# Patient Record
Sex: Female | Born: 1985 | Race: Black or African American | Hispanic: No | Marital: Single | Smoking: Former smoker
Health system: Southern US, Community
[De-identification: ages and names within clinical notes are randomized; demographics above are authoritative.]

## PROBLEM LIST (undated history)

## (undated) ENCOUNTER — Inpatient Hospital Stay (HOSPITAL_COMMUNITY): Payer: Self-pay

## (undated) DIAGNOSIS — A599 Trichomoniasis, unspecified: Secondary | ICD-10-CM

## (undated) DIAGNOSIS — O98219 Gonorrhea complicating pregnancy, unspecified trimester: Secondary | ICD-10-CM

## (undated) DIAGNOSIS — O98819 Other maternal infectious and parasitic diseases complicating pregnancy, unspecified trimester: Secondary | ICD-10-CM

## (undated) DIAGNOSIS — K219 Gastro-esophageal reflux disease without esophagitis: Secondary | ICD-10-CM

## (undated) DIAGNOSIS — R51 Headache: Secondary | ICD-10-CM

## (undated) DIAGNOSIS — A749 Chlamydial infection, unspecified: Secondary | ICD-10-CM

## (undated) HISTORY — PX: NO PAST SURGERIES: SHX2092

---
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
## 2007-06-03 ENCOUNTER — Emergency Department (HOSPITAL_COMMUNITY): Admission: EM | Admit: 2007-06-03 | Discharge: 2007-06-03 | Payer: Self-pay | Admitting: Emergency Medicine

## 2007-12-04 ENCOUNTER — Emergency Department (HOSPITAL_COMMUNITY): Admission: EM | Admit: 2007-12-04 | Discharge: 2007-12-04 | Payer: Self-pay | Admitting: Emergency Medicine

## 2008-01-04 ENCOUNTER — Emergency Department (HOSPITAL_COMMUNITY): Admission: EM | Admit: 2008-01-04 | Discharge: 2008-01-04 | Payer: Self-pay | Admitting: Emergency Medicine

## 2008-09-05 ENCOUNTER — Emergency Department (HOSPITAL_COMMUNITY): Admission: EM | Admit: 2008-09-05 | Discharge: 2008-09-05 | Payer: Self-pay | Admitting: Family Medicine

## 2009-02-04 ENCOUNTER — Emergency Department (HOSPITAL_COMMUNITY): Admission: EM | Admit: 2009-02-04 | Discharge: 2009-02-04 | Payer: Self-pay | Admitting: Emergency Medicine

## 2009-02-09 ENCOUNTER — Emergency Department (HOSPITAL_COMMUNITY): Admission: EM | Admit: 2009-02-09 | Discharge: 2009-02-09 | Payer: Self-pay | Admitting: Emergency Medicine

## 2009-02-17 ENCOUNTER — Inpatient Hospital Stay (HOSPITAL_COMMUNITY): Admission: AD | Admit: 2009-02-17 | Discharge: 2009-02-17 | Payer: Self-pay | Admitting: Obstetrics & Gynecology

## 2009-02-28 ENCOUNTER — Inpatient Hospital Stay (HOSPITAL_COMMUNITY): Admission: AD | Admit: 2009-02-28 | Discharge: 2009-03-01 | Payer: Self-pay | Admitting: Family Medicine

## 2009-09-03 ENCOUNTER — Emergency Department (HOSPITAL_COMMUNITY): Admission: EM | Admit: 2009-09-03 | Discharge: 2009-09-03 | Payer: Self-pay | Admitting: Emergency Medicine

## 2010-01-03 ENCOUNTER — Ambulatory Visit: Payer: Self-pay | Admitting: Advanced Practice Midwife

## 2010-01-03 ENCOUNTER — Inpatient Hospital Stay (HOSPITAL_COMMUNITY): Admission: AD | Admit: 2010-01-03 | Discharge: 2010-01-03 | Payer: Self-pay | Admitting: Obstetrics & Gynecology

## 2010-01-27 ENCOUNTER — Emergency Department (HOSPITAL_COMMUNITY): Admission: EM | Admit: 2010-01-27 | Discharge: 2010-01-27 | Payer: Self-pay | Admitting: Emergency Medicine

## 2010-06-02 LAB — URINALYSIS, ROUTINE W REFLEX MICROSCOPIC
Bilirubin Urine: NEGATIVE
Glucose, UA: NEGATIVE mg/dL
Ketones, ur: NEGATIVE mg/dL
Leukocytes, UA: NEGATIVE
Nitrite: NEGATIVE
Protein, ur: NEGATIVE mg/dL
Specific Gravity, Urine: 1.025 (ref 1.005–1.030)
Urobilinogen, UA: 0.2 mg/dL (ref 0.0–1.0)
pH: 5.5 (ref 5.0–8.0)

## 2010-06-02 LAB — WET PREP, GENITAL
Trich, Wet Prep: NONE SEEN
Yeast Wet Prep HPF POC: NONE SEEN

## 2010-06-02 LAB — GC/CHLAMYDIA PROBE AMP, GENITAL: Chlamydia, DNA Probe: POSITIVE — AB

## 2010-06-02 LAB — URINE MICROSCOPIC-ADD ON

## 2010-06-23 LAB — CBC
HCT: 33.7 % — ABNORMAL LOW (ref 36.0–46.0)
Hemoglobin: 11.6 g/dL — ABNORMAL LOW (ref 12.0–15.0)
MCHC: 34.4 g/dL (ref 30.0–36.0)
MCV: 91.2 fL (ref 78.0–100.0)
Platelets: 307 10*3/uL (ref 150–400)
RBC: 3.7 MIL/uL — ABNORMAL LOW (ref 3.87–5.11)
RDW: 16.7 % — ABNORMAL HIGH (ref 11.5–15.5)
WBC: 7 10*3/uL (ref 4.0–10.5)

## 2010-06-23 LAB — DIFFERENTIAL
Basophils Absolute: 0.1 10*3/uL (ref 0.0–0.1)
Basophils Relative: 1 % (ref 0–1)
Eosinophils Absolute: 0.1 10*3/uL (ref 0.0–0.7)
Monocytes Absolute: 0.5 10*3/uL (ref 0.1–1.0)
Neutro Abs: 3.9 10*3/uL (ref 1.7–7.7)
Neutrophils Relative %: 56 % (ref 43–77)

## 2010-06-23 LAB — GC/CHLAMYDIA PROBE AMP, GENITAL
Chlamydia, DNA Probe: POSITIVE — AB
Chlamydia, DNA Probe: NEGATIVE

## 2010-06-23 LAB — URINALYSIS, ROUTINE W REFLEX MICROSCOPIC
Nitrite: NEGATIVE
Specific Gravity, Urine: 1.024 (ref 1.005–1.030)
pH: 6.5 (ref 5.0–8.0)

## 2010-06-23 LAB — WET PREP, GENITAL
Clue Cells Wet Prep HPF POC: NONE SEEN
Clue Cells Wet Prep HPF POC: NONE SEEN
Trich, Wet Prep: NONE SEEN
Yeast Wet Prep HPF POC: NONE SEEN

## 2010-06-23 LAB — URINE MICROSCOPIC-ADD ON

## 2010-06-23 LAB — URINE CULTURE

## 2010-06-23 LAB — POCT I-STAT, CHEM 8
BUN: 5 mg/dL — ABNORMAL LOW (ref 6–23)
Calcium, Ion: 1.23 mmol/L (ref 1.12–1.32)
Creatinine, Ser: 0.7 mg/dL (ref 0.4–1.2)
Hemoglobin: 12.9 g/dL (ref 12.0–15.0)
Sodium: 137 mEq/L (ref 135–145)
TCO2: 22 mmol/L (ref 0–100)

## 2010-06-23 LAB — POCT PREGNANCY, URINE: Preg Test, Ur: POSITIVE

## 2010-06-28 LAB — GC/CHLAMYDIA PROBE AMP, GENITAL
Chlamydia, DNA Probe: NEGATIVE
GC Probe Amp, Genital: NEGATIVE

## 2010-06-28 LAB — WET PREP, GENITAL
Trich, Wet Prep: NONE SEEN
Yeast Wet Prep HPF POC: NONE SEEN

## 2010-06-28 LAB — POCT URINALYSIS DIP (DEVICE)
Bilirubin Urine: NEGATIVE
Ketones, ur: NEGATIVE mg/dL
Protein, ur: NEGATIVE mg/dL
Specific Gravity, Urine: 1.02 (ref 1.005–1.030)

## 2010-06-28 LAB — POCT PREGNANCY, URINE: Preg Test, Ur: NEGATIVE

## 2010-09-03 ENCOUNTER — Emergency Department (HOSPITAL_COMMUNITY)
Admission: EM | Admit: 2010-09-03 | Discharge: 2010-09-03 | Disposition: A | Discharge status: Home / Self Care | Payer: Self-pay | Attending: Emergency Medicine | Admitting: Emergency Medicine

## 2010-09-03 DIAGNOSIS — K047 Periapical abscess without sinus: Secondary | ICD-10-CM | POA: Insufficient documentation

## 2010-09-03 DIAGNOSIS — K089 Disorder of teeth and supporting structures, unspecified: Secondary | ICD-10-CM | POA: Insufficient documentation

## 2010-09-03 LAB — POCT PREGNANCY, URINE: Preg Test, Ur: NEGATIVE

## 2010-09-03 LAB — URINE MICROSCOPIC-ADD ON

## 2010-09-03 LAB — URINALYSIS, ROUTINE W REFLEX MICROSCOPIC
Glucose, UA: NEGATIVE mg/dL
Ketones, ur: NEGATIVE mg/dL
Leukocytes, UA: NEGATIVE
Nitrite: NEGATIVE
Protein, ur: NEGATIVE mg/dL
Urobilinogen, UA: 0.2 mg/dL (ref 0.0–1.0)

## 2010-09-04 ENCOUNTER — Emergency Department (HOSPITAL_COMMUNITY)
Admission: EM | Admit: 2010-09-04 | Discharge: 2010-09-04 | Disposition: A | Discharge status: Home / Self Care | Payer: Self-pay | Attending: Emergency Medicine | Admitting: Emergency Medicine

## 2010-09-04 DIAGNOSIS — K029 Dental caries, unspecified: Secondary | ICD-10-CM | POA: Insufficient documentation

## 2010-09-04 DIAGNOSIS — R220 Localized swelling, mass and lump, head: Secondary | ICD-10-CM | POA: Insufficient documentation

## 2010-09-04 DIAGNOSIS — R109 Unspecified abdominal pain: Secondary | ICD-10-CM | POA: Insufficient documentation

## 2010-09-04 DIAGNOSIS — R6883 Chills (without fever): Secondary | ICD-10-CM | POA: Insufficient documentation

## 2010-09-04 DIAGNOSIS — K089 Disorder of teeth and supporting structures, unspecified: Secondary | ICD-10-CM | POA: Insufficient documentation

## 2010-09-04 DIAGNOSIS — K047 Periapical abscess without sinus: Secondary | ICD-10-CM | POA: Insufficient documentation

## 2010-09-04 DIAGNOSIS — R112 Nausea with vomiting, unspecified: Secondary | ICD-10-CM | POA: Insufficient documentation

## 2010-12-20 LAB — DIFFERENTIAL
Eosinophils Absolute: 0
Lymphs Abs: 0.4 — ABNORMAL LOW
Monocytes Absolute: 0.7
Monocytes Relative: 8
Neutro Abs: 7.1
Neutrophils Relative %: 87 — ABNORMAL HIGH

## 2010-12-20 LAB — URINALYSIS, ROUTINE W REFLEX MICROSCOPIC
Bilirubin Urine: NEGATIVE
Glucose, UA: NEGATIVE
Nitrite: NEGATIVE
Specific Gravity, Urine: 1.022
pH: 8.5 — ABNORMAL HIGH

## 2010-12-20 LAB — POCT I-STAT, CHEM 8
Chloride: 102
Creatinine, Ser: 0.8
Glucose, Bld: 102 — ABNORMAL HIGH
Potassium: 3.6

## 2010-12-20 LAB — CBC
Hemoglobin: 11.4 — ABNORMAL LOW
MCV: 87.1
RBC: 3.85 — ABNORMAL LOW
WBC: 8.2

## 2011-01-28 ENCOUNTER — Encounter: Payer: Self-pay | Admitting: *Deleted

## 2011-01-28 ENCOUNTER — Emergency Department (HOSPITAL_COMMUNITY)
Admission: EM | Admit: 2011-01-28 | Discharge: 2011-01-28 | Disposition: A | Discharge status: Home / Self Care | Payer: Self-pay | Attending: Emergency Medicine | Admitting: Emergency Medicine

## 2011-01-28 DIAGNOSIS — F17200 Nicotine dependence, unspecified, uncomplicated: Secondary | ICD-10-CM | POA: Insufficient documentation

## 2011-01-28 DIAGNOSIS — B9689 Other specified bacterial agents as the cause of diseases classified elsewhere: Secondary | ICD-10-CM | POA: Insufficient documentation

## 2011-01-28 DIAGNOSIS — A499 Bacterial infection, unspecified: Secondary | ICD-10-CM | POA: Insufficient documentation

## 2011-01-28 DIAGNOSIS — N72 Inflammatory disease of cervix uteri: Secondary | ICD-10-CM | POA: Insufficient documentation

## 2011-01-28 DIAGNOSIS — N760 Acute vaginitis: Secondary | ICD-10-CM | POA: Insufficient documentation

## 2011-01-28 LAB — WET PREP, GENITAL
Trich, Wet Prep: NONE SEEN
Yeast Wet Prep HPF POC: NONE SEEN

## 2011-01-28 LAB — PREGNANCY, URINE: Preg Test, Ur: NEGATIVE

## 2011-01-28 MED ORDER — METRONIDAZOLE 500 MG PO TABS: 500.0000 mg | ORAL_TABLET | Freq: Two times a day (BID) | ORAL | Status: AC

## 2011-01-28 MED ORDER — CEFTRIAXONE SODIUM 250 MG IJ SOLR
250.0000 mg | Freq: Once | INTRAMUSCULAR | Status: AC
  Administered 2011-01-28: 250 mg via INTRAMUSCULAR
  Filled 2011-01-28: qty 250

## 2011-01-28 MED ORDER — AZITHROMYCIN 250 MG PO TABS
1000.0000 mg | ORAL_TABLET | Freq: Once | ORAL | Status: AC
  Administered 2011-01-28: 1000 mg via ORAL
  Filled 2011-01-28: qty 4

## 2011-01-28 MED ORDER — LIDOCAINE HCL 1 % IJ SOLN
INTRAMUSCULAR | Status: AC
  Administered 2011-01-28: 21:00:00
  Filled 2011-01-28: qty 20

## 2011-01-28 NOTE — ED Provider Notes (Signed)
History     CSN: [id number] Arrival date & time: 01/28/2011  5:48 PM   First MD Initiated Contact with Patient 01/28/11 1857      Chief Complaint  Patient presents with  . Vaginal Itching    (Consider location/radiation/quality/duration/timing/severity/associated sxs/prior treatment) Patient is a 25 y.o. female presenting with vaginal itching. The history is provided by the patient.  Vaginal Itching This is a recurrent problem. The current episode started in the past 7 days. The problem occurs constantly. The problem has been gradually worsening. Pertinent negatives include no abdominal pain or weakness. The symptoms are aggravated by intercourse. She has tried nothing for the symptoms.    History reviewed. No pertinent past medical history.  History reviewed. No pertinent past surgical history.  No family history on file.  History  Substance Use Topics  . Smoking status: Current Everyday Smoker -- 0.5 packs/day  . Smokeless tobacco: Not on file  . Alcohol Use: No    OB History    Grav Para Term Preterm Abortions TAB SAB Ect Mult Living                  Review of Systems  Constitutional: Negative.   HENT: Negative.   Eyes: Negative.   Respiratory: Negative.   Cardiovascular: Negative.   Gastrointestinal: Negative.  Negative for abdominal pain.  Genitourinary: Positive for vaginal discharge and vaginal pain. Negative for dysuria, flank pain, decreased urine volume, difficulty urinating and pelvic pain.  Musculoskeletal: Negative.   Neurological: Negative.  Negative for weakness.  Hematological: Negative.   Psychiatric/Behavioral: Negative.     Allergies  Review of patient's allergies indicates no known allergies.  Home Medications   Current Outpatient Rx  Name Route Sig Dispense Refill  . ACETAMINOPHEN 500 MG PO TABS Oral Take 1,000 mg by mouth every 6 (six) hours as needed. Pain.     Marland Kitchen NAPROXEN SODIUM 220 MG PO TABS Oral Take 220 mg by mouth 2 (two) times  daily with a meal. Pain.       BP 110/67  Pulse 88  Temp(Src) 98.2 F (36.8 C) (Oral)  Resp 88  Wt 144 lb (65.318 kg)  SpO2 100%  LMP 01/05/2011  Physical Exam  Constitutional: She is oriented to person, place, and time. She appears well-developed.  HENT:  Head: Normocephalic.  Eyes: Pupils are equal, round, and reactive to light.  Neck: Normal range of motion.  Cardiovascular: Normal rate.   Pulmonary/Chest: Effort normal.  Abdominal: Soft.  Genitourinary: There is breast tenderness and discharge. No breast swelling. Vaginal discharge found.  Musculoskeletal: Normal range of motion.  Neurological: She is oriented to person, place, and time.  Skin: Skin is warm.  Psychiatric: She has a normal mood and affect.    ED Course  Procedures (including critical care time)   Labs Reviewed  PREGNANCY, URINE  GC/CHLAMYDIA PROBE AMP, GENITAL  WET PREP, GENITAL   No results found.   No diagnosis found.    MDM  Will treated with Rocephin and azithromycin and review smear   8:02 PM   No trich, yeast seen on smear   Arman Filter, NP 01/28/11 1951  Arman Filter, NP 01/28/11 2002

## 2011-01-28 NOTE — ED Notes (Signed)
Pt states "I think I have bacterial vaginosis, I tried to get an appt @ the [facility] but couldn't until Thurs and I can't wait, there is clumpy white cheesy looking stuff, I've been using Zest shower gel"

## 2011-02-01 NOTE — ED Provider Notes (Signed)
Medical screening examination/treatment/procedure(s) were performed by non-physician practitioner and as supervising physician I was immediately available for consultation/collaboration. Devoria Albe, MD, FACEP   Ward Givens, MD 02/01/11 [phone]

## 2011-02-19 DIAGNOSIS — O98219 Gonorrhea complicating pregnancy, unspecified trimester: Secondary | ICD-10-CM

## 2011-02-19 DIAGNOSIS — A749 Chlamydial infection, unspecified: Secondary | ICD-10-CM

## 2011-02-19 HISTORY — DX: Gonorrhea complicating pregnancy, unspecified trimester: O98.219

## 2011-02-19 HISTORY — DX: Chlamydial infection, unspecified: A74.9

## 2011-03-07 ENCOUNTER — Encounter (HOSPITAL_COMMUNITY): Payer: Self-pay | Admitting: *Deleted

## 2011-03-07 ENCOUNTER — Inpatient Hospital Stay (HOSPITAL_COMMUNITY)
Admission: AD | Admit: 2011-03-07 | Discharge: 2011-03-08 | Disposition: A | Discharge status: Home / Self Care | Payer: Medicaid Other | Source: Ambulatory Visit | Attending: Obstetrics & Gynecology | Admitting: Obstetrics & Gynecology

## 2011-03-07 DIAGNOSIS — N760 Acute vaginitis: Secondary | ICD-10-CM | POA: Insufficient documentation

## 2011-03-07 DIAGNOSIS — Z1389 Encounter for screening for other disorder: Secondary | ICD-10-CM

## 2011-03-07 DIAGNOSIS — O34599 Maternal care for other abnormalities of gravid uterus, unspecified trimester: Secondary | ICD-10-CM | POA: Insufficient documentation

## 2011-03-07 DIAGNOSIS — B9689 Other specified bacterial agents as the cause of diseases classified elsewhere: Secondary | ICD-10-CM | POA: Insufficient documentation

## 2011-03-07 DIAGNOSIS — O2390 Unspecified genitourinary tract infection in pregnancy, unspecified trimester: Secondary | ICD-10-CM | POA: Insufficient documentation

## 2011-03-07 DIAGNOSIS — N831 Corpus luteum cyst of ovary, unspecified side: Secondary | ICD-10-CM | POA: Insufficient documentation

## 2011-03-07 DIAGNOSIS — Z3490 Encounter for supervision of normal pregnancy, unspecified, unspecified trimester: Secondary | ICD-10-CM

## 2011-03-07 DIAGNOSIS — A499 Bacterial infection, unspecified: Secondary | ICD-10-CM | POA: Insufficient documentation

## 2011-03-07 DIAGNOSIS — R109 Unspecified abdominal pain: Secondary | ICD-10-CM | POA: Insufficient documentation

## 2011-03-07 LAB — URINE MICROSCOPIC-ADD ON

## 2011-03-07 LAB — URINALYSIS, ROUTINE W REFLEX MICROSCOPIC
Bilirubin Urine: NEGATIVE
Ketones, ur: 15 mg/dL — AB
Nitrite: NEGATIVE
Protein, ur: NEGATIVE mg/dL
Urobilinogen, UA: 1 mg/dL (ref 0.0–1.0)

## 2011-03-07 LAB — WET PREP, GENITAL

## 2011-03-07 NOTE — Progress Notes (Signed)
Pt unsure of LMP, reports cramping and nausea x 2-3 wks.  UPT + today at home.

## 2011-03-07 NOTE — ED Provider Notes (Signed)
Chief Complaint:  Abdominal Cramping and Nausea   Kristie Sandoval is  25 y.o. No obstetric history on file..  Patient's last menstrual period was 01/05/2011.Marland Kitchen  Her pregnancy status is positive.  She presents complaining of Abdominal Cramping and Nausea . Onset is described as ongoing and has been present for  3 weeks. Reports lower abd cramping and right sided low back pain. Abd vaginal discharge. Denies dysuria and vag bleeding  Obstetrical/Gynecological History: OB History    Grav Para Term Preterm Abortions TAB SAB Ect Mult Living   2    1  1    0      Past Medical History: Past Medical History  Diagnosis Date  . No pertinent past medical history     Past Surgical History: Past Surgical History  Procedure Date  . No past surgeries     Family History: No family history on file.  Social History: History  Substance Use Topics  . Smoking status: Current Everyday Smoker -- 0.2 packs/day    Types: Cigarettes  . Smokeless tobacco: Not on file  . Alcohol Use: No    Allergies: No Known Allergies  Prescriptions prior to admission  Medication Sig Dispense Refill  . acetaminophen (TYLENOL) 500 MG tablet Take 1,000 mg by mouth every 6 (six) hours as needed. Pain.       . naproxen sodium (ANAPROX) 220 MG tablet Take 220 mg by mouth 2 (two) times daily with a meal. Pain.         Review of Systems - Negative except what has been reviewed in the HPI  Physical Exam   Blood pressure 118/63, pulse 74, temperature 98.7 F (37.1 C), resp. rate 16, height 5\' 5"  (1.651 m), weight 145 lb (65.772 kg), last menstrual period 01/05/2011.  General: General appearance - alert, well appearing, and in no distress, oriented to person, place, and time and normal appearing weight Mental status - alert, oriented to person, place, and time, normal mood, behavior, speech, dress, motor activity, and thought processes, affect appropriate to mood Abdomen - soft, nontender, nondistended, no masses or  organomegaly Focused Gynecological Exam: VULVA: normal appearing vulva with no masses, tenderness or lesions, VAGINA: normal appearing vagina with normal color and discharge, no lesions, CERVIX: normal appearing cervix without discharge or lesions, UTERUS: enlarged to 8 week's size, ADNEXA: normal adnexa in size, nontender and no masses  Labs: Recent Results (from the past 24 hour(s))  URINALYSIS, ROUTINE W REFLEX MICROSCOPIC   Collection Time   03/07/11 11:20 PM      Component Value Range   Color, Urine YELLOW  YELLOW    APPearance CLEAR  CLEAR    Specific Gravity, Urine 1.025  1.005 - 1.030    pH 6.0  5.0 - 8.0    Glucose, UA NEGATIVE  NEGATIVE (mg/dL)   Hgb urine dipstick MODERATE (*) NEGATIVE    Bilirubin Urine NEGATIVE  NEGATIVE    Ketones, ur 15 (*) NEGATIVE (mg/dL)   Protein, ur NEGATIVE  NEGATIVE (mg/dL)   Urobilinogen, UA 1.0  0.0 - 1.0 (mg/dL)   Nitrite NEGATIVE  NEGATIVE    Leukocytes, UA NEGATIVE  NEGATIVE   URINE MICROSCOPIC-ADD ON   Collection Time   03/07/11 11:20 PM      Component Value Range   Squamous Epithelial / LPF FEW (*) RARE    WBC, UA 0-2  <3 (WBC/hpf)   RBC / HPF 3-6  <3 (RBC/hpf)   Bacteria, UA FEW (*) RARE   WET PREP,  GENITAL   Collection Time   03/07/11 11:35 PM      Component Value Range   Yeast, Wet Prep NONE SEEN  NONE SEEN    Trich, Wet Prep NONE SEEN  NONE SEEN    Clue Cells, Wet Prep MODERATE (*) NONE SEEN    WBC, Wet Prep HPF POC FEW (*) NONE SEEN   ABO/RH   Collection Time   03/07/11 11:45 PM      Component Value Range   ABO/RH(D) O POS    HCG, QUANTITATIVE, PREGNANCY   Collection Time   03/07/11 11:45 PM      Component Value Range   hCG, Beta Chain, Quant, S 7693 (*) <5 (mIU/mL)   Imaging Studies: Attempted bedside US: enlarged uterus ? Yolk sac, unable to identify fetal pole, will proceed with formal scan in radiology  *RADIOLOGY REPORT*  Clinical Data: Pelvic pain, unsure of dates  OBSTETRIC <14 WK [place] AND TRANSVAGINAL OB  US  Technique: Both transabdominal and transvaginal ultrasound examinations were performed for complete evaluation of the gestation as well as the maternal uterus, adnexal regions, and pelvic cul-de-sac. Transvaginal technique was performed to assess early pregnancy.  Comparison: None.  Intrauterine gestational sac: Visualized/normal in shape. Yolk sac: Present Embryo: Not visualized  MSD: 8 mm, corresponding to an estimated gestational age of [redacted] weeks 3 days [place] EDC: 11/05/2011  Maternal uterus/adnexae: No subchorionic hemorrhage.  Right ovary measures 2.2 x 3.5 x 2.5 cm and is notable for a 1.2 cm corpus luteal cyst.  Left ovary measures 1.3 x 2.2 x 1.8 cm.  No free fluid.  IMPRESSION: Single intrauterine gestation with estimated gestational age [redacted] weeks 3 days by mean sac diameter.  Original Report Authenticated By: Charline Bills, M.D.   Assessment: IUP at [redacted]w[redacted]d  Right CL cyst BV  Plan: Discharge home Rx Tindamax Preg verification given with referral for [facility]   Kristie Sandoval E. 03/08/2011,12:43 AM

## 2011-03-08 ENCOUNTER — Other Ambulatory Visit: Payer: Self-pay | Admitting: Physician Assistant

## 2011-03-08 ENCOUNTER — Inpatient Hospital Stay (HOSPITAL_COMMUNITY): Payer: Medicaid Other

## 2011-03-08 DIAGNOSIS — O219 Vomiting of pregnancy, unspecified: Secondary | ICD-10-CM

## 2011-03-08 LAB — ABO/RH: ABO/RH(D): O POS

## 2011-03-08 LAB — POCT PREGNANCY, URINE: Preg Test, Ur: POSITIVE

## 2011-03-08 MED ORDER — TINIDAZOLE 500 MG PO TABS: 2.0000 g | ORAL_TABLET | Freq: Every day | ORAL | Status: DC

## 2011-03-08 MED ORDER — PROMETHAZINE HCL 25 MG PO TABS: 25.0000 mg | ORAL_TABLET | Freq: Four times a day (QID) | ORAL | Status: DC | PRN

## 2011-03-09 LAB — GC/CHLAMYDIA PROBE AMP, GENITAL: Chlamydia, DNA Probe: NEGATIVE

## 2011-03-10 ENCOUNTER — Inpatient Hospital Stay (HOSPITAL_COMMUNITY)
Admission: AD | Admit: 2011-03-10 | Discharge: 2011-03-10 | Disposition: A | Discharge status: Home / Self Care | Payer: Self-pay | Source: Ambulatory Visit | Attending: Obstetrics & Gynecology | Admitting: Obstetrics & Gynecology

## 2011-03-10 ENCOUNTER — Encounter (HOSPITAL_COMMUNITY): Payer: Self-pay | Admitting: *Deleted

## 2011-03-10 DIAGNOSIS — K5289 Other specified noninfective gastroenteritis and colitis: Secondary | ICD-10-CM | POA: Insufficient documentation

## 2011-03-10 DIAGNOSIS — R109 Unspecified abdominal pain: Secondary | ICD-10-CM | POA: Insufficient documentation

## 2011-03-10 DIAGNOSIS — K529 Noninfective gastroenteritis and colitis, unspecified: Secondary | ICD-10-CM

## 2011-03-10 DIAGNOSIS — R197 Diarrhea, unspecified: Secondary | ICD-10-CM | POA: Insufficient documentation

## 2011-03-10 LAB — URINALYSIS, ROUTINE W REFLEX MICROSCOPIC
Glucose, UA: NEGATIVE mg/dL
Leukocytes, UA: NEGATIVE
Protein, ur: 30 mg/dL — AB
pH: 6 (ref 5.0–8.0)

## 2011-03-10 LAB — CBC
Hemoglobin: 12.1 g/dL (ref 12.0–15.0)
MCHC: 34.7 g/dL (ref 30.0–36.0)
RBC: 3.91 MIL/uL (ref 3.87–5.11)
WBC: 9.1 10*3/uL (ref 4.0–10.5)

## 2011-03-10 LAB — URINE MICROSCOPIC-ADD ON

## 2011-03-10 MED ORDER — LACTATED RINGERS IV BOLUS (SEPSIS)
1000.0000 mL | Freq: Once | INTRAVENOUS | Status: AC
  Administered 2011-03-10: 1000 mL via INTRAVENOUS

## 2011-03-10 MED ORDER — PROMETHAZINE HCL 25 MG/ML IJ SOLN
25.0000 mg | Freq: Once | INTRAMUSCULAR | Status: AC
  Administered 2011-03-10: 25 mg via INTRAVENOUS
  Filled 2011-03-10: qty 1

## 2011-03-10 MED ORDER — HYDROMORPHONE HCL PF 1 MG/ML IJ SOLN
1.0000 mg | Freq: Once | INTRAMUSCULAR | Status: AC
  Administered 2011-03-10: 1 mg via INTRAMUSCULAR
  Filled 2011-03-10: qty 1

## 2011-03-10 MED ORDER — ONDANSETRON 4 MG PO TBDP
4.0000 mg | ORAL_TABLET | Freq: Once | ORAL | Status: AC
  Administered 2011-03-10: 4 mg via ORAL
  Filled 2011-03-10: qty 1

## 2011-03-10 NOTE — ED Provider Notes (Signed)
History   Pt presents today c/o severe lower abd cramping. She states she has had several episodes of diarrhea and has vomited multiple times in the past 2 days. She denies vag bleeding, vag dc, fever, or any other sx at this time. Pt initially arrived via wheelchair and crying.  Chief Complaint  Patient presents with  . Abdominal Pain   HPI  OB History    Grav Para Term Preterm Abortions TAB SAB Ect Mult Living   2    1  1    0      Past Medical History  Diagnosis Date  . No pertinent past medical history     Past Surgical History  Procedure Date  . No past surgeries     No family history on file.  History  Substance Use Topics  . Smoking status: Current Everyday Smoker -- 0.2 packs/day    Types: Cigarettes  . Smokeless tobacco: Not on file  . Alcohol Use: No    Allergies: No Known Allergies  Prescriptions prior to admission  Medication Sig Dispense Refill  . acetaminophen (TYLENOL) 500 MG tablet Take 1,000 mg by mouth every 6 (six) hours as needed. Pain.       . metroNIDAZOLE (FLAGYL) 500 MG tablet Take 500 mg by mouth 3 (three) times daily.        . promethazine (PHENERGAN) 25 MG tablet Take 1 tablet (25 mg total) by mouth every 6 (six) hours as needed for nausea.  30 tablet  0  . DISCONTD: tinidazole (TINDAMAX) 500 MG tablet Take 4 tablets (2,000 mg total) by mouth daily with breakfast.  8 tablet  0    Review of Systems  Constitutional: Negative for fever.  Eyes: Negative for blurred vision.  Cardiovascular: Negative for chest pain and palpitations.  Gastrointestinal: Positive for nausea, vomiting, abdominal pain and diarrhea. Negative for constipation.  Genitourinary: Negative for dysuria, urgency, frequency and hematuria.  Neurological: Negative for dizziness and headaches.  Psychiatric/Behavioral: Negative for depression and suicidal ideas.   Physical Exam   Blood pressure 116/64, pulse 58, temperature 97.1 F (36.2 C), temperature source Oral, resp.  rate 22, last menstrual period 02/05/2011.  Physical Exam  Nursing note and vitals reviewed. Constitutional: She is oriented to person, place, and time. She appears well-developed and well-nourished. She appears distressed.  HENT:  Head: Normocephalic and atraumatic.  Eyes: EOM are normal. Pupils are equal, round, and reactive to light.  GI: Soft. She exhibits no distension. There is no tenderness. There is no rebound and no guarding.  Genitourinary: No bleeding around the vagina. No vaginal discharge found.       Cervix Lg/closed. No bleeding or dc noted.   Neurological: She is alert and oriented to person, place, and time.  Skin: Skin is warm and dry. She is not diaphoretic.  Psychiatric: She has a normal mood and affect. Her behavior is normal. Judgment and thought content normal.    MAU Course  Procedures  Results for orders placed during the hospital encounter of 03/10/11 (from the past 24 hour(s))  CBC     Status: Abnormal   Collection Time   03/10/11  1:10 PM      Component Value Range   WBC 9.1  4.0 - 10.5 (K/uL)   RBC 3.91  3.87 - 5.11 (MIL/uL)   Hemoglobin 12.1  12.0 - 15.0 (g/dL)   HCT 91.4 (*) 78.2 - 46.0 (%)   MCV 89.3  78.0 - 100.0 (fL)   MCH 30.9  26.0 - 34.0 (pg)   MCHC 34.7  30.0 - 36.0 (g/dL)   RDW 16.1  09.6 - 04.5 (%)   Platelets 303  150 - 400 (K/uL)  URINALYSIS, ROUTINE W REFLEX MICROSCOPIC     Status: Abnormal   Collection Time   03/10/11  1:10 PM      Component Value Range   Color, Urine YELLOW  YELLOW    APPearance HAZY (*) CLEAR    Specific Gravity, Urine >1.030 (*) 1.005 - 1.030    pH 6.0  5.0 - 8.0    Glucose, UA NEGATIVE  NEGATIVE (mg/dL)   Hgb urine dipstick LARGE (*) NEGATIVE    Bilirubin Urine MODERATE (*) NEGATIVE    Ketones, ur >80 (*) NEGATIVE (mg/dL)   Protein, ur 30 (*) NEGATIVE (mg/dL)   Urobilinogen, UA 1.0  0.0 - 1.0 (mg/dL)   Nitrite NEGATIVE  NEGATIVE    Leukocytes, UA NEGATIVE  NEGATIVE   URINE MICROSCOPIC-ADD ON     Status:  Abnormal   Collection Time   03/10/11  1:10 PM      Component Value Range   Squamous Epithelial / LPF MANY (*) RARE    RBC / HPF 7-10  <3 (RBC/hpf)   Urine-Other MUCOUS PRESENT      [place] reviewed from pt last visit which demonstrated an IUP.  Pt sx have completely resolved following IV hydration/antiemetics and pain medication.  Pt has had no vomiting or diarrhea while in the MAU. Assessment and Plan  Gastroenteritis: Discussed with pt at length. She has an Rx for phenergan which she may also use vaginally. She will use Imodium as needed for diarrhea. Discussed diet, activity, risks, and precautions.  Clinton Gallant. Rice III, DrHSc, MPAS, PA-C  03/10/2011, 4:19 PM   Henrietta Hoover, PA 03/10/11 1627

## 2011-03-10 NOTE — Progress Notes (Signed)
Pt seen in MAU on Monday for abd pain, had U/S - IUP, came in EMS today with severe pain since last night, vomitting.  Pt anxious, crying.

## 2011-03-11 NOTE — ED Provider Notes (Signed)
History     Chief Complaint  Patient presents with  . Abdominal Pain   HPI  Telephone call  Past Medical History  Diagnosis Date  . No pertinent past medical history     Past Surgical History  Procedure Date  . No past surgeries     No family history on file.  History  Substance Use Topics  . Smoking status: Current Everyday Smoker -- 0.2 packs/day    Types: Cigarettes  . Smokeless tobacco: Not on file  . Alcohol Use: No    Allergies: No Known Allergies  No prescriptions prior to admission    ROS Physical Exam   Blood pressure 111/59, pulse 62, temperature 97.1 F (36.2 C), temperature source Oral, resp. rate 18, last menstrual period 02/05/2011.  Physical Exam  MAU Course  Procedures  Pt called and said the phenergan 25mg  were not strong enough and wanted suppositories or something stronger- pt does not have insurance. Pt willing to try Phenergan tablets in vagina Phenergan 25 mg tablets #30 no RF called to [facility] [phone].  Discussed measures for morning sickness.  If pt unable to keep fluids down, would need to return to MAU.  Pt to proceed with Medicaid application and pursue OB care.   Assessment and Plan    Kristie Sandoval 03/11/2011, 5:28 PM

## 2011-03-21 ENCOUNTER — Encounter (HOSPITAL_COMMUNITY): Payer: Self-pay | Admitting: *Deleted

## 2011-03-21 ENCOUNTER — Inpatient Hospital Stay (HOSPITAL_COMMUNITY)
Admission: AD | Admit: 2011-03-21 | Discharge: 2011-03-21 | Disposition: A | Discharge status: Home / Self Care | Payer: Medicaid Other | Source: Ambulatory Visit | Attending: Obstetrics & Gynecology | Admitting: Obstetrics & Gynecology

## 2011-03-21 DIAGNOSIS — O210 Mild hyperemesis gravidarum: Secondary | ICD-10-CM | POA: Insufficient documentation

## 2011-03-21 DIAGNOSIS — K529 Noninfective gastroenteritis and colitis, unspecified: Secondary | ICD-10-CM

## 2011-03-21 DIAGNOSIS — R109 Unspecified abdominal pain: Secondary | ICD-10-CM | POA: Insufficient documentation

## 2011-03-21 DIAGNOSIS — R197 Diarrhea, unspecified: Secondary | ICD-10-CM | POA: Insufficient documentation

## 2011-03-21 DIAGNOSIS — M549 Dorsalgia, unspecified: Secondary | ICD-10-CM | POA: Insufficient documentation

## 2011-03-21 DIAGNOSIS — K5289 Other specified noninfective gastroenteritis and colitis: Secondary | ICD-10-CM

## 2011-03-21 LAB — URINE MICROSCOPIC-ADD ON

## 2011-03-21 LAB — URINALYSIS, ROUTINE W REFLEX MICROSCOPIC
Glucose, UA: NEGATIVE mg/dL
Ketones, ur: NEGATIVE mg/dL
Protein, ur: NEGATIVE mg/dL

## 2011-03-21 MED ORDER — ONDANSETRON HCL 4 MG/2ML IJ SOLN
4.0000 mg | Freq: Once | INTRAMUSCULAR | Status: AC
  Administered 2011-03-21: 4 mg via INTRAVENOUS
  Filled 2011-03-21: qty 2

## 2011-03-21 MED ORDER — ONDANSETRON 8 MG PO TBDP: 8.0000 mg | ORAL_TABLET | Freq: Three times a day (TID) | ORAL | Status: AC | PRN

## 2011-03-21 MED ORDER — CYCLOBENZAPRINE HCL 10 MG PO TABS
10.0000 mg | ORAL_TABLET | Freq: Once | ORAL | Status: AC
  Administered 2011-03-21: 10 mg via ORAL
  Filled 2011-03-21: qty 1

## 2011-03-21 MED ORDER — CYCLOBENZAPRINE HCL 10 MG PO TABS: 10.0000 mg | ORAL_TABLET | Freq: Three times a day (TID) | ORAL | Status: AC | PRN

## 2011-03-21 MED ORDER — DEXTROSE 5 % IN LACTATED RINGERS IV BOLUS
1000.0000 mL | Freq: Once | INTRAVENOUS | Status: AC
  Administered 2011-03-21: 1000 mL via INTRAVENOUS

## 2011-03-21 MED ORDER — OXYCODONE-ACETAMINOPHEN 5-325 MG PO TABS
1.0000 | ORAL_TABLET | Freq: Once | ORAL | Status: AC
  Administered 2011-03-21: 1 via ORAL
  Filled 2011-03-21: qty 1

## 2011-03-21 MED ORDER — PROMETHAZINE HCL 25 MG PO TABS: 25.0000 mg | ORAL_TABLET | Freq: Four times a day (QID) | ORAL | Status: DC | PRN

## 2011-03-21 NOTE — ED Provider Notes (Addendum)
History     Chief Complaint  Patient presents with  . Abdominal Pain  . Hyperemesis Gravidarum   HPI  Pt is [redacted]w[redacted]d pregnant and complains of continued nausea and vomiting after on 12/20 and has been using phenergan.  Today she continues have back pain and side pain.  She has diarrhea 6 to 7 times daily- she has not been using Immodium.  She has frequency of urination and suprapubic pain/tenderness.  She has vaginal discharge- pink to clear discharge.    Past Medical History  Diagnosis Date  . No pertinent past medical history     Past Surgical History  Procedure Date  . No past surgeries     Family History  Problem Relation Age of Onset  . Anesthesia problems Neg Hx     History  Substance Use Topics  . Smoking status: Current Everyday Smoker -- 0.2 packs/day    Types: Cigarettes  . Smokeless tobacco: Never Used  . Alcohol Use: No    Allergies: No Known Allergies  Prescriptions prior to admission  Medication Sig Dispense Refill  . acetaminophen (TYLENOL) 500 MG tablet Take 1,000 mg by mouth every 6 (six) hours as needed. Pain.       . metroNIDAZOLE (FLAGYL) 500 MG tablet Take 500 mg by mouth 3 (three) times daily.          ROS Physical Exam   Blood pressure 112/57, pulse 62, temperature 97.7 F (36.5 C), temperature source Oral, resp. rate 18, height 5' 4.5" (1.638 m), weight 143 lb 2 oz (64.921 kg), last menstrual period 02/05/2011.  Physical Exam  Vitals reviewed. Constitutional: She is oriented to person, place, and time. She appears well-developed and well-nourished.  HENT:  Head: Normocephalic.  Eyes: Pupils are equal, round, and reactive to light.  Neck: Normal range of motion. Neck supple.  Cardiovascular: Normal rate.   Respiratory: Effort normal.  GI: Soft.       Negative flank pain  Musculoskeletal: Normal range of motion.  Neurological: She is alert and oriented to person, place, and time.  Skin: Skin is warm and dry.  Psychiatric: She has a  normal mood and affect.    MAU Course  Procedures Results for orders placed during the hospital encounter of 03/21/11 (from the past 24 hour(s))  URINALYSIS, ROUTINE W REFLEX MICROSCOPIC     Status: Abnormal   Collection Time   03/21/11  4:50 PM      Component Value Range   Color, Urine YELLOW  YELLOW    APPearance HAZY (*) CLEAR    Specific Gravity, Urine 1.025  1.005 - 1.030    pH 6.5  5.0 - 8.0    Glucose, UA NEGATIVE  NEGATIVE (mg/dL)   Hgb urine dipstick SMALL (*) NEGATIVE    Bilirubin Urine NEGATIVE  NEGATIVE    Ketones, ur NEGATIVE  NEGATIVE (mg/dL)   Protein, ur NEGATIVE  NEGATIVE (mg/dL)   Urobilinogen, UA 0.2  0.0 - 1.0 (mg/dL)   Nitrite NEGATIVE  NEGATIVE    Leukocytes, UA TRACE (*) NEGATIVE   URINE MICROSCOPIC-ADD ON     Status: Abnormal   Collection Time   03/21/11  4:50 PM      Component Value Range   Squamous Epithelial / LPF FEW (*) RARE    WBC, UA 3-6  <3 (WBC/hpf)   RBC / HPF 3-6  <3 (RBC/hpf)   Bacteria, UA FEW (*) RARE    Urine-Other MUCOUS PRESENT        Assessment  and Plan  Care handed over to Nolene Bernheim, NP at [phone]  Assessment Nausea and vomiting with diarrhea- possibly due to gastroenteritis Back pain  Plan Urine culture pending Will prescribe medication for nausea - phenergan and zofran Has an appointment for certification at the health dept on Jan 24 Brat diet Will prescribe flexeril for back pain - pain is in left low back and hip    LINEBERRY,SUSAN 03/21/2011, 5:24 PM   Nolene Bernheim, NP 03/21/11 2019

## 2011-03-21 NOTE — Progress Notes (Signed)
States the only sxs new today are spotting when wiping and vaginal discomfort.

## 2011-03-21 NOTE — ED Provider Notes (Signed)
Attestation of Attending Supervision of Advanced Practitioner: Evaluation and management procedures were performed by the PA/NP/CNM/OB Fellow under my supervision/collaboration. Chart reviewed, and agree with management and plan.  Jaynie Collins, M.D. 03/21/2011 11:16 PM

## 2011-03-21 NOTE — Progress Notes (Signed)
Has multiple C/O; HA, N/V, severe back pain, abdominal pain. States the same as when she was here before. States she has taken pain medication and Phenergan that was prescribed to her and nothing helps. Patient moaning out loud.

## 2011-03-21 NOTE — Progress Notes (Signed)
Pt states n/v x4 days, still having abd pain since her last MAU visit. Notes spotting when she wipes only, clear odorous thin vaginal discharge noted.

## 2011-03-22 NOTE — L&D Delivery Note (Signed)
I was present for delivery CRESENZO-DISHMAN,Eulonda Andalon 10/26/2011 6:58 PM

## 2011-03-22 NOTE — ED Provider Notes (Signed)
Agree with above note.  Kaitlynn Tramontana H. 03/22/2011 9:24 PM  

## 2011-03-22 NOTE — L&D Delivery Note (Signed)
Delivery Note At  a viable female was delivered via NSVD (Presentation: OA ;  ).  APGAR: pending ; weight pending.   Placenta status: spontaneous, intact.  Cord: 3 vessel with the following complications: none.  Cord pH: n/a  Anesthesia:  epidural Episiotomy: n/a Lacerations: 1st degree Suture Repair: n/a Est. Blood Loss (mL): 300  Mom to postpartum.  Baby to nursery-stable.  Supervised by Jacklyn Shell.  Sandoval, Kristie Milius 10/26/2011, 6:30 PM

## 2011-04-06 ENCOUNTER — Inpatient Hospital Stay (HOSPITAL_COMMUNITY)
Admission: AD | Admit: 2011-04-06 | Discharge: 2011-04-06 | Disposition: A | Discharge status: Home / Self Care | Payer: Medicaid Other | Source: Ambulatory Visit | Attending: Obstetrics & Gynecology | Admitting: Obstetrics & Gynecology

## 2011-04-06 ENCOUNTER — Encounter (HOSPITAL_COMMUNITY): Payer: Self-pay | Admitting: *Deleted

## 2011-04-06 DIAGNOSIS — O219 Vomiting of pregnancy, unspecified: Secondary | ICD-10-CM

## 2011-04-06 DIAGNOSIS — O210 Mild hyperemesis gravidarum: Secondary | ICD-10-CM | POA: Insufficient documentation

## 2011-04-06 DIAGNOSIS — O98819 Other maternal infectious and parasitic diseases complicating pregnancy, unspecified trimester: Secondary | ICD-10-CM | POA: Insufficient documentation

## 2011-04-06 DIAGNOSIS — A5901 Trichomonal vulvovaginitis: Secondary | ICD-10-CM

## 2011-04-06 LAB — WET PREP, GENITAL: Yeast Wet Prep HPF POC: NONE SEEN

## 2011-04-06 LAB — URINE CULTURE
Colony Count: NO GROWTH
Culture  Setup Time: 201301170131
Culture: NO GROWTH

## 2011-04-06 LAB — URINALYSIS, ROUTINE W REFLEX MICROSCOPIC
Nitrite: NEGATIVE
Specific Gravity, Urine: 1.025 (ref 1.005–1.030)
Urobilinogen, UA: 0.2 mg/dL (ref 0.0–1.0)

## 2011-04-06 MED ORDER — METRONIDAZOLE 500 MG PO TABS: 500.0000 mg | ORAL_TABLET | Freq: Two times a day (BID) | ORAL | Status: DC

## 2011-04-06 MED ORDER — ONDANSETRON 8 MG PO TBDP
8.0000 mg | ORAL_TABLET | Freq: Once | ORAL | Status: AC
  Administered 2011-04-06: 8 mg via ORAL
  Filled 2011-04-06: qty 1

## 2011-04-06 MED ORDER — METOCLOPRAMIDE HCL 10 MG PO TABS: 10.0000 mg | ORAL_TABLET | Freq: Four times a day (QID) | ORAL | Status: DC

## 2011-04-06 MED ORDER — PROMETHAZINE HCL 25 MG PO TABS: 25.0000 mg | ORAL_TABLET | Freq: Four times a day (QID) | ORAL | Status: DC | PRN

## 2011-04-06 NOTE — Progress Notes (Signed)
C/o n/v continued, ran out of phenergan.  NPC yet.  C/o right flank pain and vag d/c with odor at times.

## 2011-04-06 NOTE — Progress Notes (Signed)
Pt reports lower back pain today (pt states the pain is not a new thing), states she has been vomiting continuously today, nauseated all day. States she has had vomiting throughout preg but she has ran out of meds.

## 2011-04-06 NOTE — ED Provider Notes (Signed)
History     Chief Complaint  Patient presents with  . Hyperemesis Gravidarum   HPI 26 y.o. G2P0010 at [redacted]w[redacted]d with n/v - ongoing throughout pregnancy, ran out of phenergan. Also c/o vaginal discharge with odor and right flank pain that has also been ongoing since onset of pregnancy.    Past Medical History  Diagnosis Date  . No pertinent past medical history     Past Surgical History  Procedure Date  . No past surgeries     Family History  Problem Relation Age of Onset  . Anesthesia problems Neg Hx     History  Substance Use Topics  . Smoking status: Former Smoker -- 0.2 packs/day    Types: Cigarettes    Quit date: 02/18/2011  . Smokeless tobacco: Never Used  . Alcohol Use: No    Allergies: No Known Allergies  Prescriptions prior to admission  Medication Sig Dispense Refill  . acetaminophen (TYLENOL) 500 MG tablet Take 1,000 mg by mouth every 6 (six) hours as needed. Pain.      . metroNIDAZOLE (FLAGYL) 500 MG tablet Take 500 mg by mouth 3 (three) times daily.       Marland Kitchen OVER THE COUNTER MEDICATION Take 1 tablet by mouth daily as needed. Acid reducer      . promethazine (PHENERGAN) 25 MG tablet Take 25 mg by mouth every 6 (six) hours as needed. For nausea/vomiting        Review of Systems  Constitutional: Negative.   Respiratory: Negative.   Cardiovascular: Negative.   Gastrointestinal: Positive for nausea and vomiting. Negative for abdominal pain, diarrhea and constipation.  Genitourinary: Negative for dysuria, urgency, frequency, hematuria and flank pain.       Positive for vaginal discharge   Musculoskeletal: Negative.   Neurological: Negative.   Psychiatric/Behavioral: Negative.    Physical Exam   Blood pressure 123/64, pulse 72, temperature 98.6 F (37 C), resp. rate 18, height 5\' 4"  (1.626 m), weight 144 lb (65.318 kg), last menstrual period 02/05/2011, SpO2 99.00%.  Physical Exam  Constitutional: She is oriented to person, place, and time. She appears  well-developed and well-nourished. No distress.  HENT:  Head: Normocephalic and atraumatic.  Cardiovascular: Normal rate, regular rhythm and normal heart sounds.   Respiratory: Effort normal and breath sounds normal. No respiratory distress.  GI: Soft. Bowel sounds are normal. She exhibits no distension and no mass. There is no tenderness. There is no rebound, no guarding and no CVA tenderness.  Genitourinary: There is no rash or lesion on the right labia. There is no rash or lesion on the left labia. Uterus is not deviated, not enlarged, not fixed and not tender. Cervix exhibits discharge (white, foamy). Cervix exhibits no motion tenderness and no friability. Right adnexum displays no mass, no tenderness and no fullness. Left adnexum displays no mass, no tenderness and no fullness. No erythema, tenderness or bleeding around the vagina. No vaginal discharge found.  Neurological: She is alert and oriented to person, place, and time.  Skin: Skin is warm and dry.  Psychiatric: She has a normal mood and affect.    MAU Course  Procedures  Results for orders placed during the hospital encounter of 04/06/11 (from the past 24 hour(s))  URINALYSIS, ROUTINE W REFLEX MICROSCOPIC     Status: Abnormal   Collection Time   04/06/11  8:20 PM      Component Value Range   Color, Urine YELLOW  YELLOW    APPearance HAZY (*) CLEAR  Specific Gravity, Urine 1.025  1.005 - 1.030    pH 5.5  5.0 - 8.0    Glucose, UA NEGATIVE  NEGATIVE (mg/dL)   Hgb urine dipstick MODERATE (*) NEGATIVE    Bilirubin Urine NEGATIVE  NEGATIVE    Ketones, ur NEGATIVE  NEGATIVE (mg/dL)   Protein, ur NEGATIVE  NEGATIVE (mg/dL)   Urobilinogen, UA 0.2  0.0 - 1.0 (mg/dL)   Nitrite NEGATIVE  NEGATIVE    Leukocytes, UA SMALL (*) NEGATIVE   URINE MICROSCOPIC-ADD ON     Status: Abnormal   Collection Time   04/06/11  8:20 PM      Component Value Range   Squamous Epithelial / LPF FEW (*) RARE    WBC, UA 3-6  <3 (WBC/hpf)   RBC / HPF 3-6   <3 (RBC/hpf)   Bacteria, UA FEW (*) RARE   WET PREP, GENITAL     Status: Abnormal   Collection Time   04/06/11  9:05 PM      Component Value Range   Yeast, Wet Prep NONE SEEN  NONE SEEN    Trich, Wet Prep FEW (*) NONE SEEN    Clue Cells, Wet Prep MANY (*) NONE SEEN    WBC, Wet Prep HPF POC FEW (*) NONE SEEN      Assessment and Plan  26 y.o. G2P0010 at [redacted]w[redacted]d N/V - rx phenergan and reglan Trich - rx flagyl Urine culture sent Start prenatal care as soon as possible  Alida Greiner 04/06/2011, 9:32 PM

## 2011-04-13 ENCOUNTER — Inpatient Hospital Stay (HOSPITAL_COMMUNITY)
Admission: AD | Admit: 2011-04-13 | Discharge: 2011-04-13 | Disposition: A | Discharge status: Home / Self Care | Payer: Medicaid Other | Source: Ambulatory Visit | Attending: Obstetrics & Gynecology | Admitting: Obstetrics & Gynecology

## 2011-04-13 ENCOUNTER — Encounter (HOSPITAL_COMMUNITY): Payer: Self-pay

## 2011-04-13 ENCOUNTER — Inpatient Hospital Stay (HOSPITAL_COMMUNITY): Payer: Medicaid Other

## 2011-04-13 DIAGNOSIS — O219 Vomiting of pregnancy, unspecified: Secondary | ICD-10-CM

## 2011-04-13 DIAGNOSIS — O210 Mild hyperemesis gravidarum: Secondary | ICD-10-CM | POA: Insufficient documentation

## 2011-04-13 DIAGNOSIS — O26859 Spotting complicating pregnancy, unspecified trimester: Secondary | ICD-10-CM

## 2011-04-13 DIAGNOSIS — A5901 Trichomonal vulvovaginitis: Secondary | ICD-10-CM | POA: Insufficient documentation

## 2011-04-13 DIAGNOSIS — O98819 Other maternal infectious and parasitic diseases complicating pregnancy, unspecified trimester: Secondary | ICD-10-CM | POA: Insufficient documentation

## 2011-04-13 LAB — URINE MICROSCOPIC-ADD ON

## 2011-04-13 LAB — CBC
Hemoglobin: 12 g/dL (ref 12.0–15.0)
MCH: 31 pg (ref 26.0–34.0)
MCHC: 34.9 g/dL (ref 30.0–36.0)
MCV: 88.9 fL (ref 78.0–100.0)
Platelets: 309 10*3/uL (ref 150–400)

## 2011-04-13 LAB — COMPREHENSIVE METABOLIC PANEL
ALT: 9 U/L (ref 0–35)
Calcium: 9.9 mg/dL (ref 8.4–10.5)
Creatinine, Ser: 0.5 mg/dL (ref 0.50–1.10)
GFR calc Af Amer: >90 mL/min (ref >90)
GFR calc non Af Amer: >90 mL/min (ref >90)
Glucose, Bld: 78 mg/dL (ref 70–99)
Sodium: 134 mEq/L — ABNORMAL LOW (ref 135–145)
Total Protein: 7.4 g/dL (ref 6.0–8.3)

## 2011-04-13 LAB — URINALYSIS, ROUTINE W REFLEX MICROSCOPIC
Bilirubin Urine: NEGATIVE
Ketones, ur: >80 mg/dL — AB
Nitrite: NEGATIVE
Urobilinogen, UA: 0.2 mg/dL (ref 0.0–1.0)

## 2011-04-13 MED ORDER — ONDANSETRON HCL 4 MG PO TABS: 4.0000 mg | ORAL_TABLET | Freq: Three times a day (TID) | ORAL | Status: DC | PRN

## 2011-04-13 MED ORDER — METRONIDAZOLE 500 MG PO TABS
2000.0000 mg | ORAL_TABLET | Freq: Once | ORAL | Status: AC
  Administered 2011-04-13: 2000 mg via ORAL
  Filled 2011-04-13: qty 4

## 2011-04-13 MED ORDER — METOCLOPRAMIDE HCL 10 MG PO TABS: 10.0000 mg | ORAL_TABLET | Freq: Four times a day (QID) | ORAL | Status: DC

## 2011-04-13 MED ORDER — ONDANSETRON 8 MG PO TBDP
8.0000 mg | ORAL_TABLET | Freq: Once | ORAL | Status: AC
  Administered 2011-04-13: 8 mg via ORAL
  Filled 2011-04-13: qty 1

## 2011-04-13 MED ORDER — PROMETHAZINE HCL 25 MG/ML IJ SOLN
12.5000 mg | Freq: Once | INTRAMUSCULAR | Status: AC
  Administered 2011-04-13: 12.5 mg via INTRAVENOUS
  Filled 2011-04-13 (×2): qty 1

## 2011-04-13 MED ORDER — LACTATED RINGERS IV BOLUS (SEPSIS)
1000.0000 mL | Freq: Once | INTRAVENOUS | Status: AC
  Administered 2011-04-13: 1000 mL via INTRAVENOUS

## 2011-04-13 MED ORDER — PROMETHAZINE HCL 25 MG/ML IJ SOLN: 25.0000 mg | Freq: Once | INTRAMUSCULAR | Status: DC

## 2011-04-13 NOTE — Progress Notes (Addendum)
Pt in c/o nausea and vomiting since Sunday.  Reports diarrhea on Sunday, none since.  Unable to keep anything down. Reports spotting after urination x2 episodes.

## 2011-04-13 NOTE — ED Provider Notes (Addendum)
History   Pt presents today c/o severe N&V that has worsened over the past several days. She states she can no longer keep anything on her stomach. She reports one episode of diarrhea last Sunday but none since. She also reports some vag spotting this morning. She has not bee taking her Flagyl for trich as directed because it makes her N&V worse.   Chief Complaint  Patient presents with  . Abdominal Pain  . Emesis During Pregnancy   HPI  OB History    Grav Para Term Preterm Abortions TAB SAB Ect Mult Living   2 0 0 0 1 0 1 0 0 0       Past Medical History  Diagnosis Date  . No pertinent past medical history     Past Surgical History  Procedure Date  . No past surgeries     Family History  Problem Relation Age of Onset  . Anesthesia problems Neg Hx   . Diabetes Maternal Grandmother     History  Substance Use Topics  . Smoking status: Former Smoker -- 0.2 packs/day    Types: Cigarettes    Quit date: 02/18/2011  . Smokeless tobacco: Never Used  . Alcohol Use: No    Allergies: No Known Allergies  Prescriptions prior to admission  Medication Sig Dispense Refill  . acetaminophen (TYLENOL) 500 MG tablet Take 1,000 mg by mouth every 6 (six) hours as needed. Pain.      . metoCLOPramide (REGLAN) 10 MG tablet Take 1 tablet (10 mg total) by mouth 4 (four) times daily.  30 tablet  2  . metroNIDAZOLE (FLAGYL) 500 MG tablet Take 1 tablet (500 mg total) by mouth 2 (two) times daily.  14 tablet  0  . OVER THE COUNTER MEDICATION Take 1 tablet by mouth daily as needed. Acid reducer      . promethazine (PHENERGAN) 25 MG tablet Take 1 tablet (25 mg total) by mouth every 6 (six) hours as needed. For nausea/vomiting  30 tablet  2    Review of Systems  Constitutional: Negative for fever.  Eyes: Negative for blurred vision and double vision.  Respiratory: Negative for cough, hemoptysis, sputum production, shortness of breath and wheezing.   Cardiovascular: Negative for chest pain and  palpitations.  Gastrointestinal: Positive for nausea and vomiting. Negative for abdominal pain, diarrhea and constipation.  Genitourinary: Negative for dysuria, urgency, frequency and hematuria.  Neurological: Negative for dizziness and headaches.  Psychiatric/Behavioral: Negative for depression and suicidal ideas.   Physical Exam   Blood pressure 92/53, pulse 68, temperature 98.3 F (36.8 C), temperature source Oral, height 5' 1.5" (1.562 m), weight 139 lb 9.6 oz (63.322 kg), last menstrual period 02/05/2011, SpO2 99.00%.  Physical Exam  Nursing note and vitals reviewed. Constitutional: She is oriented to person, place, and time. She appears well-developed and well-nourished. No distress.  HENT:  Head: Normocephalic and atraumatic.  Eyes: EOM are normal. Pupils are equal, round, and reactive to light.  GI: Soft. She exhibits no distension. There is no tenderness. There is no rebound and no guarding.  Neurological: She is alert and oriented to person, place, and time.  Skin: Skin is warm and dry. She is not diaphoretic.  Psychiatric: She has a normal mood and affect. Her behavior is normal. Judgment and thought content normal.    MAU Course  Procedures    Assessment and Plan  Care of pt turned over to [facility], FNP at 7pm.  Clinton Gallant. Rice III, DrHSc, MPAS, PA-C  04/13/2011, 6:34 PM   Henrietta Hoover, PA 04/13/11 1854  Pt received Zofran ODT and Phenergan IV, IV hydration. Also treated for trich with Flagyl 2000 mg po while in MAU.   US Ob Comp Less 14 Wks  04/13/2011   *RADIOLOGY REPORT*  Clinical Data: Pelvic pain and vaginal bleeding.  OBSTETRIC <14 WK ULTRASOUND  Technique:  Transabdominal ultrasound was performed for evaluation of the gestation as well as the maternal uterus and adnexal regions.  Comparison:  Pelvic ultrasound performed 03/08/2011  Intrauterine gestational sac:  Visualized/normal in shape. Yolk sac: Yes Embryo: Yes Cardiac Activity: Yes Heart Rate: 155 bpm   CRL: 3.98   cm  11   w  0   d             [place] EDC: 11/02/2011  Maternal uterus/adnexae: No subchorionic hemorrhage is seen.  The uterus is otherwise unremarkable in appearance.  The ovaries are within normal limits.  The right ovary measures 2.9 x 1.9 x 1.9 cm, while the left ovary measures 2.2 x 1.4 x 1.2 cm. No suspicious adnexal masses are seen; there is no evidence for ovarian torsion.  No free fluid is seen within the pelvic cul-de-sac.  IMPRESSION: Single live intrauterine pregnancy, with a crown-rump length of 4.0 cm, corresponding to a gestational age of [redacted] weeks 0 days.  This does not match the gestational age by LMP, and reflects a new estimated date of delivery of November 02, 2011.  Original Report Authenticated By: Tonia Ghent, M.D.   Results for orders placed during the hospital encounter of 04/13/11 (from the past 24 hour(s))  URINALYSIS, ROUTINE W REFLEX MICROSCOPIC     Status: Abnormal   Collection Time   04/13/11  5:50 PM      Component Value Range   Color, Urine YELLOW  YELLOW    APPearance CLEAR  CLEAR    Specific Gravity, Urine >1.030 (*) 1.005 - 1.030    pH 6.0  5.0 - 8.0    Glucose, UA NEGATIVE  NEGATIVE (mg/dL)   Hgb urine dipstick MODERATE (*) NEGATIVE    Bilirubin Urine NEGATIVE  NEGATIVE    Ketones, ur >80 (*) NEGATIVE (mg/dL)   Protein, ur NEGATIVE  NEGATIVE (mg/dL)   Urobilinogen, UA 0.2  0.0 - 1.0 (mg/dL)   Nitrite NEGATIVE  NEGATIVE    Leukocytes, UA NEGATIVE  NEGATIVE   URINE MICROSCOPIC-ADD ON     Status: Abnormal   Collection Time   04/13/11  5:50 PM      Component Value Range   Squamous Epithelial / LPF FEW (*) RARE    WBC, UA 0-2  <3 (WBC/hpf)   RBC / HPF 7-10  <3 (RBC/hpf)   Bacteria, UA RARE  RARE    Urine-Other MUCOUS PRESENT    CBC     Status: Abnormal   Collection Time   04/13/11  6:10 PM      Component Value Range   WBC 8.1  4.0 - 10.5 (K/uL)   RBC 3.87  3.87 - 5.11 (MIL/uL)   Hemoglobin 12.0  12.0 - 15.0 (g/dL)   HCT 16.1 (*) 09.6 - 46.0 (%)     MCV 88.9  78.0 - 100.0 (fL)   MCH 31.0  26.0 - 34.0 (pg)   MCHC 34.9  30.0 - 36.0 (g/dL)   RDW 04.5  40.9 - 81.1 (%)   Platelets 309  150 - 400 (K/uL)  COMPREHENSIVE METABOLIC PANEL     Status: Abnormal   Collection  Time   04/13/11  6:10 PM      Component Value Range   Sodium 134 (*) 135 - 145 (mEq/L)   Potassium 3.1 (*) 3.5 - 5.1 (mEq/L)   Chloride 97  96 - 112 (mEq/L)   CO2 22  19 - 32 (mEq/L)   Glucose, Bld 78  70 - 99 (mg/dL)   BUN 8  6 - 23 (mg/dL)   Creatinine, Ser 3.66  0.50 - 1.10 (mg/dL)   Calcium 9.9  8.4 - 44.0 (mg/dL)   Total Protein 7.4  6.0 - 8.3 (g/dL)   Albumin 3.9  3.5 - 5.2 (g/dL)   AST 12  0 - 37 (U/L)   ALT 9  0 - 35 (U/L)   Alkaline Phosphatase 43  39 - 117 (U/L)   Total Bilirubin 0.3  0.3 - 1.2 (mg/dL)   GFR calc non Af Amer >90  >90 (mL/min)   GFR calc Af Amer >90  >90 (mL/min)    A/P: 26 y.o. G2P0010 at [redacted]w[redacted]d N/V in pregnancy - has phenergan and reglan at home, rx Zofran  Trich dx at last visit - treated with Flagyl today Start prenatal care as soon as possible

## 2011-04-13 NOTE — Progress Notes (Signed)
Patient states she has been having abdominal and back pain since 1-20, comes and goes. Has had nausea and vomiting for 2 days and not able to keep anything down. Has been having diarrhea for 2 days. Had a little spotting.

## 2011-04-18 ENCOUNTER — Encounter (HOSPITAL_COMMUNITY): Payer: Self-pay | Admitting: *Deleted

## 2011-04-18 ENCOUNTER — Emergency Department (HOSPITAL_COMMUNITY)
Admission: EM | Admit: 2011-04-18 | Discharge: 2011-04-19 | Disposition: A | Discharge status: Home / Self Care | Payer: Medicaid Other | Attending: Emergency Medicine | Admitting: Emergency Medicine

## 2011-04-18 DIAGNOSIS — R630 Anorexia: Secondary | ICD-10-CM | POA: Insufficient documentation

## 2011-04-18 DIAGNOSIS — Z79899 Other long term (current) drug therapy: Secondary | ICD-10-CM | POA: Insufficient documentation

## 2011-04-18 DIAGNOSIS — E119 Type 2 diabetes mellitus without complications: Secondary | ICD-10-CM | POA: Insufficient documentation

## 2011-04-18 DIAGNOSIS — R109 Unspecified abdominal pain: Secondary | ICD-10-CM | POA: Insufficient documentation

## 2011-04-18 DIAGNOSIS — R1110 Vomiting, unspecified: Secondary | ICD-10-CM | POA: Insufficient documentation

## 2011-04-18 MED ORDER — SODIUM CHLORIDE 0.9 % IV BOLUS (SEPSIS)
2000.0000 mL | Freq: Once | INTRAVENOUS | Status: AC
  Administered 2011-04-18: 2000 mL via INTRAVENOUS

## 2011-04-18 MED ORDER — ONDANSETRON HCL 4 MG/2ML IJ SOLN
4.0000 mg | Freq: Once | INTRAMUSCULAR | Status: AC
  Administered 2011-04-18: 4 mg via INTRAVENOUS
  Filled 2011-04-18: qty 2

## 2011-04-18 NOTE — ED Provider Notes (Signed)
History     CSN: [id number]  Arrival date & time 04/18/11  2127   First MD Initiated Contact with Patient 04/18/11 2237      Chief Complaint  Patient presents with  . Abdominal Pain    (Consider location/radiation/quality/duration/timing/severity/associated sxs/prior treatment) HPI  Past Medical History  Diagnosis Date  . No pertinent past medical history     Past Surgical History  Procedure Date  . No past surgeries     Family History  Problem Relation Age of Onset  . Anesthesia problems Neg Hx   . Diabetes Maternal Grandmother     History  Substance Use Topics  . Smoking status: Former Smoker -- 0.2 packs/day    Types: Cigarettes    Quit date: 02/18/2011  . Smokeless tobacco: Never Used  . Alcohol Use: No    OB History    Grav Para Term Preterm Abortions TAB SAB Ect Mult Living   2 0 0 0 1 0 1 0 0 0       Review of Systems  Allergies  Review of patient's allergies indicates no known allergies.  Home Medications   Current Outpatient Rx  Name Route Sig Dispense Refill  . ACETAMINOPHEN 500 MG PO TABS Oral Take 1,000 mg by mouth every 6 (six) hours as needed. Pain.    Marland Kitchen METOCLOPRAMIDE HCL 10 MG PO TABS Oral Take 10 mg by mouth 4 (four) times daily.    Marland Kitchen PROMETHAZINE HCL 25 MG PO TABS  25 mg every 6 (six) hours as needed. For nausea/vomiting      BP 108/61  Pulse 88  Temp(Src) 98.2 F (36.8 C) (Oral)  Resp 22  SpO2 100%  LMP 02/05/2011  Physical Exam  ED Course  Procedures (including critical care time)  Labs Reviewed - No data to display No results found.   No diagnosis found.    MDM  Medical screening exam complains of low abdominal cramping and multiple episodes of vomiting and unable to hold down Phenergan for the past several days. Since seen at [facility] hospital 04/12/2010 percent complaint prescribed Phenergan and Zofran. She has not filled prescription for Zofran. She has not been able to eat or drink for 2 days on exam alert  nontoxic lungs clear auscultation heart regular rate and rhythm abdomen nondistended nontender        Doug Sou, MD 04/18/11 2318

## 2011-04-18 NOTE — ED Notes (Signed)
abd pain for 2 days and she says she is 3 months pregnant.  Vomiting also

## 2011-04-18 NOTE — ED Notes (Signed)
Placed pt in gown and provided her with a specimen cup and directed her to the bathroom for urine collection.

## 2011-04-19 LAB — BASIC METABOLIC PANEL
BUN: 4 mg/dL — ABNORMAL LOW (ref 6–23)
Calcium: 10.2 mg/dL (ref 8.4–10.5)
Creatinine, Ser: 0.45 mg/dL — ABNORMAL LOW (ref 0.50–1.10)
GFR calc Af Amer: >90 mL/min (ref >90)
GFR calc non Af Amer: >90 mL/min (ref >90)
Glucose, Bld: 71 mg/dL (ref 70–99)
Potassium: 3.2 mEq/L — ABNORMAL LOW (ref 3.5–5.1)

## 2011-04-19 LAB — URINE MICROSCOPIC-ADD ON

## 2011-04-19 LAB — URINALYSIS, ROUTINE W REFLEX MICROSCOPIC
Bilirubin Urine: NEGATIVE
Ketones, ur: >80 mg/dL — AB
Nitrite: NEGATIVE
Protein, ur: NEGATIVE mg/dL
Specific Gravity, Urine: 1.025 (ref 1.005–1.030)
Urobilinogen, UA: 0.2 mg/dL (ref 0.0–1.0)

## 2011-04-19 LAB — TYPE AND SCREEN: Antibody Screen: NEGATIVE

## 2011-04-19 MED ORDER — ONDANSETRON 4 MG PO TBDP
4.0000 mg | ORAL_TABLET | Freq: Once | ORAL | Status: AC
  Administered 2011-04-19: 4 mg via ORAL
  Filled 2011-04-19: qty 1

## 2011-04-20 ENCOUNTER — Other Ambulatory Visit (HOSPITAL_COMMUNITY): Payer: Self-pay | Admitting: Nurse Practitioner

## 2011-04-20 DIAGNOSIS — Z369 Encounter for antenatal screening, unspecified: Secondary | ICD-10-CM

## 2011-04-22 ENCOUNTER — Emergency Department (HOSPITAL_COMMUNITY)
Admission: EM | Admit: 2011-04-22 | Discharge: 2011-04-22 | Disposition: A | Discharge status: Home / Self Care | Payer: Medicaid Other | Attending: Emergency Medicine | Admitting: Emergency Medicine

## 2011-04-22 ENCOUNTER — Encounter (HOSPITAL_COMMUNITY): Payer: Self-pay | Admitting: Emergency Medicine

## 2011-04-22 DIAGNOSIS — IMO0002 Reserved for concepts with insufficient information to code with codable children: Secondary | ICD-10-CM | POA: Insufficient documentation

## 2011-04-22 DIAGNOSIS — K0889 Other specified disorders of teeth and supporting structures: Secondary | ICD-10-CM

## 2011-04-22 DIAGNOSIS — K089 Disorder of teeth and supporting structures, unspecified: Secondary | ICD-10-CM | POA: Insufficient documentation

## 2011-04-22 DIAGNOSIS — O219 Vomiting of pregnancy, unspecified: Secondary | ICD-10-CM | POA: Insufficient documentation

## 2011-04-22 DIAGNOSIS — K029 Dental caries, unspecified: Secondary | ICD-10-CM | POA: Insufficient documentation

## 2011-04-22 MED ORDER — PENICILLIN V POTASSIUM 500 MG PO TABS
500.0000 mg | ORAL_TABLET | Freq: Once | ORAL | Status: AC
  Administered 2011-04-22: 500 mg via ORAL
  Filled 2011-04-22: qty 1

## 2011-04-22 MED ORDER — ONDANSETRON 4 MG PO TBDP
ORAL_TABLET | ORAL | Status: AC
  Filled 2011-04-22: qty 1

## 2011-04-22 MED ORDER — PENICILLIN V POTASSIUM 250 MG PO TABS: 250.0000 mg | ORAL_TABLET | Freq: Four times a day (QID) | ORAL | Status: AC

## 2011-04-22 MED ORDER — ONDANSETRON 4 MG PO TBDP: 4.0000 mg | ORAL_TABLET | Freq: Once | ORAL | Status: DC

## 2011-04-22 MED ORDER — ONDANSETRON 8 MG PO TBDP
8.0000 mg | ORAL_TABLET | Freq: Once | ORAL | Status: AC
  Administered 2011-04-22: 8 mg via ORAL
  Filled 2011-04-22: qty 1

## 2011-04-22 NOTE — ED Notes (Signed)
Pt alert, presents via EMS, c/o dental infection, seen several times last few days with same c/o, resp even unlabored, skin pwd

## 2011-04-22 NOTE — ED Provider Notes (Signed)
History     CSN: [id number]  Arrival date & time 04/22/11  0029   First MD Initiated Contact with Patient 04/22/11 0049      Chief Complaint  Patient presents with  . Dental Problem     HPI Pt was seen at 0055.  Per pt, c/o gradual onset and persistence of constant right lower tooth "pain" for the past 2 to 3 days.  Denies fevers, no intra-oral edema, no rash, no facial swelling, no dysphagia, no neck pain.   The condition is aggravated by nothing. The condition is relieved by nothing.  States the "pain is making me nauseated."  Pt has been eval in multiple ED's multiple times over the past week for N/V due to pregnancy.  Has been rx zofran "but can't afford it," has been taking phenergan with "some" relief.  Pt is G2P1, 11 weeks 0 days per [place] on 04/13/11.  Denies dysuria, no vaginal bleeding/discharge, no abd/pelvic pain, no back pain.     Past Medical History  Diagnosis Date  . No pertinent past medical history     Past Surgical History  Procedure Date  . No past surgeries     Family History  Problem Relation Age of Onset  . Anesthesia problems Neg Hx   . Diabetes Maternal Grandmother     History  Substance Use Topics  . Smoking status: Former Smoker -- 0.2 packs/day    Types: Cigarettes    Quit date: 02/18/2011  . Smokeless tobacco: Never Used  . Alcohol Use: No    OB History    Grav Para Term Preterm Abortions TAB SAB Ect Mult Living   2 0 0 0 1 0 1 0 0 0       Review of Systems ROS: Statement: All systems negative except as marked or noted in the HPI; Constitutional: Negative for fever and chills. ; ; Eyes: Negative for eye pain and discharge. ; ; ENMT: Positive for dental caries, dental hygiene poor and toothache. Negative for ear pain, bleeding gums, dental injury, facial deformity, facial swelling, hoarseness, nasal congestion, sinus pressure, sore throat, throat swelling and tongue swollen. ; ; Cardiovascular: Negative for chest pain, palpitations,  diaphoresis, dyspnea and peripheral edema. ; ; Respiratory: Negative for cough, wheezing and stridor. ; ; Gastrointestinal: +nausea. Negative for vomiting, diarrhea and abdominal pain. ; ; Genitourinary: Negative for dysuria, flank pain and hematuria. ; GYN:  No vaginal bleeding, no vaginal discharge, no vulvar pain. ; Musculoskeletal: Negative for back pain and neck pain. ; ; Skin: Negative for rash and skin lesion. ; ; Neuro: Negative for headache, lightheadedness, syncope, and neck stiffness. ;    Allergies  Review of patient's allergies indicates no known allergies.  Home Medications   Current Outpatient Rx  Name Route Sig Dispense Refill  . PROMETHAZINE HCL 25 MG PO TABS  25 mg every 6 (six) hours as needed. For nausea/vomiting      BP 104/69  Pulse 66  Temp(Src) 98.5 F (36.9 C) (Oral)  Resp 18  Ht 5\' 3"  (1.6 m)  Wt 141 lb (63.957 kg)  BMI 24.98 kg/m2  SpO2 100%  LMP 02/05/2011  Physical Exam 0100: Physical examination: Vital signs and O2 SAT: Reviewed; Constitutional: Well developed, Well nourished, Well hydrated, In no acute distress; Head and Face: Normocephalic, Atraumatic; Eyes: EOMI, PERRL, No scleral icterus; ENMT: Mouth and pharynx normal, Poor dentition, Widespread dental decay, Left TM normal, Right TM normal, Mucous membranes moist, +lower right 2nd molar with extensive dental  decay.  No gingival erythema, edema, fluctuance, or drainage.  No hoarse voice, no drooling, no stridor.  ; Neck: Supple, Full range of motion, No lymphadenopathy; Cardiovascular: Regular rate and rhythm, No murmur, rub, or gallop; Respiratory: Breath sounds clear & equal bilaterally, No rales, rhonchi, wheezes, or rub, Normal respiratory effort/excursion; Chest: Nontender, Movement normal; Extremities: Pulses normal, No tenderness, No edema; Neuro: AA&Ox3, Major CN grossly intact.  No gross focal motor or sensory deficits in extremities.; Skin: Color normal, No rash, No petechiae, Warm, Dry.  ED  Course  Procedures   MDM  MDM Reviewed: previous chart, nursing note and vitals     2:23 AM:  During my exam pt is spitting saliva into an emesis bag.  There is no active vomiting.  States she "isn't sure" if she can take the abx without vomiting.  Medicated for nausea and given 1st dose of abx after zofran.  Has tol PO well without vomiting.  Wants to go home now.  Dx testing d/w pt.  Questions answered.  Verb understanding, agreeable to d/c home with outpt f/u.        Laray Anger, DO 04/22/11 2141

## 2011-04-26 ENCOUNTER — Ambulatory Visit (HOSPITAL_COMMUNITY): Admission: RE | Admit: 2011-04-26 | Payer: Medicaid Other | Source: Ambulatory Visit

## 2011-04-26 ENCOUNTER — Ambulatory Visit (HOSPITAL_COMMUNITY): Payer: Medicaid Other | Attending: Nurse Practitioner

## 2011-05-03 ENCOUNTER — Encounter (HOSPITAL_COMMUNITY): Payer: Self-pay | Admitting: *Deleted

## 2011-05-03 ENCOUNTER — Inpatient Hospital Stay (HOSPITAL_COMMUNITY)
Admission: AD | Admit: 2011-05-03 | Discharge: 2011-05-03 | Disposition: A | Discharge status: Home / Self Care | Payer: Medicaid Other | Source: Ambulatory Visit | Attending: Family Medicine | Admitting: Family Medicine

## 2011-05-03 DIAGNOSIS — R1013 Epigastric pain: Secondary | ICD-10-CM | POA: Insufficient documentation

## 2011-05-03 DIAGNOSIS — K3189 Other diseases of stomach and duodenum: Secondary | ICD-10-CM | POA: Insufficient documentation

## 2011-05-03 DIAGNOSIS — O210 Mild hyperemesis gravidarum: Secondary | ICD-10-CM | POA: Insufficient documentation

## 2011-05-03 DIAGNOSIS — O99891 Other specified diseases and conditions complicating pregnancy: Secondary | ICD-10-CM | POA: Insufficient documentation

## 2011-05-03 DIAGNOSIS — K30 Functional dyspepsia: Secondary | ICD-10-CM

## 2011-05-03 LAB — URINE MICROSCOPIC-ADD ON

## 2011-05-03 LAB — URINALYSIS, ROUTINE W REFLEX MICROSCOPIC
Bilirubin Urine: NEGATIVE
Ketones, ur: 15 mg/dL — AB
Leukocytes, UA: NEGATIVE
Nitrite: NEGATIVE
Protein, ur: NEGATIVE mg/dL
pH: 6 (ref 5.0–8.0)

## 2011-05-03 MED ORDER — FAMOTIDINE 20 MG PO TABS: 20.0000 mg | ORAL_TABLET | Freq: Two times a day (BID) | ORAL | Status: DC

## 2011-05-03 MED ORDER — GI COCKTAIL ~~LOC~~
30.0000 mL | Freq: Once | ORAL | Status: AC
  Administered 2011-05-03: 30 mL via ORAL
  Filled 2011-05-03: qty 30

## 2011-05-03 MED ORDER — ONDANSETRON 8 MG PO TBDP
8.0000 mg | ORAL_TABLET | Freq: Once | ORAL | Status: AC
  Administered 2011-05-03: 8 mg via ORAL
  Filled 2011-05-03: qty 1

## 2011-05-03 NOTE — ED Provider Notes (Signed)
History     Chief Complaint  Patient presents with  . Hyperemesis Gravidarum   HPI Kristie Sandoval is 26 y.o. G2P0010 [redacted]w[redacted]d weeks presenting after eating fried fish at [facility], fried okra, french fries and hushpuppies.  Came in by EMS.  Had an upset stomach before she went to eat so she took Zofran 8mg  ODT and thought it was better, then went out to eat.  Has tried Zofran today without relief.  She states she is vomiting greasy stuff.  Denies hx of indigestion.  Has mild indigestion now. Denies fever, diarrhea.  Has headache from the vomiting and thinks it is from the zofran.  Tylenol and acid reducing pill  have not helped because she threw it up.  Asking for something to take the oily taste out of her mouth.    Past Medical History  Diagnosis Date  . No pertinent past medical history     Past Surgical History  Procedure Date  . No past surgeries     Family History  Problem Relation Age of Onset  . Anesthesia problems Neg Hx   . Diabetes Maternal Grandmother     History  Substance Use Topics  . Smoking status: Former Smoker -- 0.2 packs/day    Types: Cigarettes    Quit date: 02/18/2011  . Smokeless tobacco: Never Used  . Alcohol Use: No    Allergies: No Known Allergies  Prescriptions prior to admission  Medication Sig Dispense Refill  . acetaminophen (TYLENOL) 500 MG tablet Take 500 mg by mouth every 6 (six) hours as needed. For headaches.      . Cimetidine (ACID REDUCER PO) Take 1-2 tablets by mouth daily as needed. For stomach acid.      . metoCLOPramide (REGLAN) 10 MG tablet Take 10 mg by mouth 4 (four) times daily.      . ondansetron (ZOFRAN) 8 MG tablet Take by mouth every 8 (eight) hours as needed. For upset stomach.      . promethazine (PHENERGAN) 25 MG tablet 25 mg every 6 (six) hours as needed. For nausea/vomiting      . penicillin v potassium (VEETID) 250 MG tablet Take 1 tablet (250 mg total) by mouth 4 (four) times daily.  20 tablet  0    Review of  Systems  Constitutional: Negative for fever.  Gastrointestinal: Positive for heartburn, nausea, vomiting and abdominal pain (mid abdomen). Negative for diarrhea and constipation.  Neurological: Positive for headaches.   Physical Exam   Blood pressure 111/66, pulse 67, temperature 97 F (36.1 C), temperature source Oral, resp. rate 16, height 5' 3.5" (1.613 m), weight 63.504 kg (140 lb), last menstrual period 02/05/2011.  Physical Exam  Constitutional: She is oriented to person, place, and time. She appears well-developed and well-nourished. No distress.  HENT:  Head: Normocephalic.  Neck: Normal range of motion.  Cardiovascular: Normal rate.   Respiratory: Effort normal.  GI: Soft. She exhibits no mass. Distention: gravid. There is tenderness (mid abdomen). There is no rebound and no guarding.  Genitourinary:       Not examined  Neurological: She is alert and oriented to person, place, and time.  Skin: Skin is warm and dry.  Psychiatric: She has a normal mood and affect. Her behavior is normal. Thought content normal.   Results for orders placed during the hospital encounter of 05/03/11 (from the past 24 hour(s))  URINALYSIS, ROUTINE W REFLEX MICROSCOPIC     Status: Abnormal   Collection Time   05/03/11  5:32 PM      Component Value Range   Color, Urine YELLOW  YELLOW    APPearance CLEAR  CLEAR    Specific Gravity, Urine >1.030 (*) 1.005 - 1.030    pH 6.0  5.0 - 8.0    Glucose, UA NEGATIVE  NEGATIVE (mg/dL)   Hgb urine dipstick MODERATE (*) NEGATIVE    Bilirubin Urine NEGATIVE  NEGATIVE    Ketones, ur 15 (*) NEGATIVE (mg/dL)   Protein, ur NEGATIVE  NEGATIVE (mg/dL)   Urobilinogen, UA 0.2  0.0 - 1.0 (mg/dL)   Nitrite NEGATIVE  NEGATIVE    Leukocytes, UA NEGATIVE  NEGATIVE   URINE MICROSCOPIC-ADD ON     Status: Abnormal   Collection Time   05/03/11  5:32 PM      Component Value Range   Squamous Epithelial / LPF FEW (*) RARE    WBC, UA 0-2  <3 (WBC/hpf)   RBC / HPF 3-6  <3  (RBC/hpf)   Urine-Other MUCOUS PRESENT      MAU Course  Procedures  MDM GI cocktail ordered.   Patient got some relief from medication.  Is asking for rx for acid reflux med.    Assessment and Plan  A:  Indigestion after eating very fatty meal last night  P:  Avoid fried, greasy, spicey food       Rx for antacid     Keep appt with her clinic for prenatal care.  Alizza Sacra,EVE M 05/03/2011, 5:20 PM   Matt Holmes, NP 05/03/11 1835

## 2011-05-03 NOTE — Progress Notes (Signed)
Pt states lower abd pain and low back pain. Ate a lot of fried foods last pm, has been vomiting since. Denies diarrhea. Took zofran 8mg  twice today with no relief. Denies bleeding or abnormal vaginal discharge.

## 2011-05-04 NOTE — ED Notes (Signed)
Pt called and sts "lost my Penicillin perscription".  I spoke with Levy Sjogren, NP in CDU,  She sts I can re-call in Rx of Penicillin v potassium 250 mg PO QID, #20, originally written by MD Clarene Duke.  Called in to [facility] at [phone]

## 2011-05-06 NOTE — ED Provider Notes (Signed)
Agree with above note.  Kristie Sandoval 05/06/2011 7:10 AM   

## 2011-05-08 ENCOUNTER — Emergency Department (HOSPITAL_COMMUNITY)
Admission: EM | Admit: 2011-05-08 | Discharge: 2011-05-09 | Disposition: A | Discharge status: Home / Self Care | Payer: Medicaid Other | Attending: Emergency Medicine | Admitting: Emergency Medicine

## 2011-05-08 ENCOUNTER — Encounter (HOSPITAL_COMMUNITY): Payer: Self-pay | Admitting: *Deleted

## 2011-05-08 DIAGNOSIS — O2690 Pregnancy related conditions, unspecified, unspecified trimester: Secondary | ICD-10-CM | POA: Insufficient documentation

## 2011-05-08 DIAGNOSIS — K5900 Constipation, unspecified: Secondary | ICD-10-CM | POA: Insufficient documentation

## 2011-05-08 DIAGNOSIS — R002 Palpitations: Secondary | ICD-10-CM | POA: Insufficient documentation

## 2011-05-08 MED ORDER — BISACODYL 5 MG PO TBEC: 5.0000 mg | DELAYED_RELEASE_TABLET | Freq: Two times a day (BID) | ORAL | Status: DC

## 2011-05-08 MED ORDER — SODIUM CHLORIDE 0.9 % IV SOLN
INTRAVENOUS | Status: DC
  Administered 2011-05-08: 21:00:00 via INTRAVENOUS

## 2011-05-08 MED ORDER — MORPHINE SULFATE 4 MG/ML IJ SOLN
4.0000 mg | Freq: Once | INTRAMUSCULAR | Status: AC
  Administered 2011-05-08: 4 mg via INTRAVENOUS
  Filled 2011-05-08: qty 1

## 2011-05-08 MED ORDER — FLEET ENEMA 7-19 GM/118ML RE ENEM
1.0000 | ENEMA | Freq: Once | RECTAL | Status: AC
  Administered 2011-05-08: 23:00:00 via RECTAL
  Filled 2011-05-08: qty 1

## 2011-05-08 MED ORDER — ONDANSETRON HCL 4 MG/2ML IJ SOLN
4.0000 mg | Freq: Once | INTRAMUSCULAR | Status: AC
  Administered 2011-05-08: 4 mg via INTRAVENOUS
  Filled 2011-05-08: qty 2

## 2011-05-08 MED ORDER — MORPHINE SULFATE 4 MG/ML IJ SOLN
4.0000 mg | Freq: Once | INTRAMUSCULAR | Status: AC
  Administered 2011-05-08: 4 mg via INTRAVENOUS

## 2011-05-08 MED ORDER — ONDANSETRON HCL 4 MG/2ML IJ SOLN
4.0000 mg | Freq: Once | INTRAMUSCULAR | Status: AC
  Administered 2011-05-08: 4 mg via INTRAVENOUS

## 2011-05-08 NOTE — ED Notes (Signed)
Pt c/o constipation x 1+ days. Pt denies n/v. Pt c/o abdominal pain. Pt has used no OTC meds. Pt is G2P0.

## 2011-05-08 NOTE — ED Notes (Signed)
Also of note, the pt is "4 months" pregnant.  Pt has not experienced any bleeding or loss of fluid today but states she did have some bleeding earlier in her pregnancy.

## 2011-05-08 NOTE — ED Notes (Signed)
Assisted MD,Caparossi of pelvic exam with no specimen sent.

## 2011-05-08 NOTE — ED Provider Notes (Signed)
History     CSN: [id number]  Arrival date & time 05/08/11  2039   First MD Initiated Contact with Patient 05/08/11 2120      Chief Complaint  Patient presents with  . Constipation    (Consider location/radiation/quality/duration/timing/severity/associated sxs/prior treatment) Patient is a 26 y.o. female presenting with constipation. The history is provided by the patient. The history is limited by the condition of the patient.  Constipation  Pertinent negatives include no nausea, no vomiting, no vaginal bleeding and no vaginal discharge.   the patient is a 26 year old, female, G2, P1 at 61 weeks estimated gestational age, who presents to the emergency department complaining of constipation for several days.  She says it is extremely painful to have a bowel movement.  She cannot sit down because the pain is so severe.  She has not had nausea, vomiting.  She denies vaginal discharge, bleeding, or urinary tract symptoms.  She states that she has seen an obstetrician and she has a viable intrauterine pregnancy.  She had a miscarriage at 4 weeks with her first pregnancy.  She is not taking any narcotics.    Past Medical History  Diagnosis Date  . No pertinent past medical history     Past Surgical History  Procedure Date  . No past surgeries     Family History  Problem Relation Age of Onset  . Anesthesia problems Neg Hx   . Diabetes Maternal Grandmother     History  Substance Use Topics  . Smoking status: Former Smoker -- 0.2 packs/day    Types: Cigarettes    Quit date: 02/18/2011  . Smokeless tobacco: Never Used  . Alcohol Use: No    OB History    Grav Para Term Preterm Abortions TAB SAB Ect Mult Living   2 0 0 0 1 0 1 0 0 0       Review of Systems  Cardiovascular: Positive for palpitations.  Gastrointestinal: Positive for constipation. Negative for nausea and vomiting.  Genitourinary: Negative for vaginal bleeding and vaginal discharge.  All other systems reviewed  and are negative.    Allergies  Review of patient's allergies indicates no known allergies.  Home Medications   Current Outpatient Rx  Name Route Sig Dispense Refill  . ACETAMINOPHEN 500 MG PO TABS Oral Take 500 mg by mouth every 6 (six) hours as needed. For headaches.    Marland Kitchen FAMOTIDINE 20 MG PO TABS Oral Take 1 tablet (20 mg total) by mouth 2 (two) times daily. 20 tablet 0  . METOCLOPRAMIDE HCL 10 MG PO TABS Oral Take 10 mg by mouth 4 (four) times daily.    Marland Kitchen ONDANSETRON HCL 8 MG PO TABS Oral Take by mouth every 8 (eight) hours as needed. For upset stomach.      BP 133/65  Pulse 82  Temp(Src) 98 F (36.7 C) (Oral)  Resp 18  Ht 5\' 3"  (1.6 m)  Wt 144 lb 6.4 oz (65.499 kg)  BMI 25.58 kg/m2  SpO2 100%  LMP 02/05/2011  Physical Exam  Vitals reviewed. Constitutional: She is oriented to person, place, and time. She appears well-developed and well-nourished. She appears distressed.       Crying in pain  HENT:  Head: Normocephalic and atraumatic.  Eyes: Conjunctivae are normal. Pupils are equal, round, and reactive to light.  Neck: Normal range of motion. Neck supple.  Cardiovascular: Normal rate.   No murmur heard. Pulmonary/Chest: Effort normal. No respiratory distress.  Abdominal: Soft. She exhibits no distension.  Genitourinary:  Vagina normal.       Pelvic examination performed with CNA chaperone.  Os is closed.  No vaginal discharge or bleeding. Rectal examination, heart stool, just at the verge of the rectum.  Musculoskeletal: Normal range of motion. She exhibits no edema.  Neurological: She is alert and oriented to person, place, and time.  Skin: Skin is warm and dry.  Psychiatric:       Crying    ED Course  Procedures (including critical care time) No tests indicated.  Fleet Enema given to the patient.  She had a large bowel movement in her pain is resolved Labs Reviewed - No data to display No results found.   No diagnosis found.    MDM  Pregnancy, with  constipation relieved in the emergency department.        Nicholes Stairs, MD 05/08/11 2340

## 2011-05-08 NOTE — ED Notes (Signed)
Pt. Has results from administration of enema.

## 2011-05-08 NOTE — ED Notes (Signed)
Pt states that she is very constipated.  Pt states that she has been trying to have a bowel movement x 2 days and is in severe pain when attempting to sit due to the fullness in her rectum.

## 2011-05-09 MED ORDER — PROMETHAZINE HCL 25 MG PO TABS: 25.0000 mg | ORAL_TABLET | Freq: Four times a day (QID) | ORAL | Status: DC | PRN

## 2011-05-09 NOTE — Discharge Instructions (Signed)
Use fleets enemas, and Dulcolax for recurrent constipation.  Both of these are safe in pregnancy.  Followup with your obstetrician, for reevaluation.  Return for worse or uncontrolled symptoms.  use phenergan for nausea and vomiting

## 2011-05-31 ENCOUNTER — Encounter (HOSPITAL_COMMUNITY): Payer: Self-pay | Admitting: *Deleted

## 2011-05-31 ENCOUNTER — Inpatient Hospital Stay (HOSPITAL_COMMUNITY)
Admission: AD | Admit: 2011-05-31 | Discharge: 2011-05-31 | Disposition: A | Discharge status: Home / Self Care | Payer: Medicaid Other | Source: Ambulatory Visit | Attending: Obstetrics & Gynecology | Admitting: Obstetrics & Gynecology

## 2011-05-31 DIAGNOSIS — O210 Mild hyperemesis gravidarum: Secondary | ICD-10-CM | POA: Insufficient documentation

## 2011-05-31 DIAGNOSIS — R51 Headache: Secondary | ICD-10-CM | POA: Insufficient documentation

## 2011-05-31 DIAGNOSIS — R112 Nausea with vomiting, unspecified: Secondary | ICD-10-CM

## 2011-05-31 HISTORY — DX: Trichomoniasis, unspecified: A59.9

## 2011-05-31 LAB — URINALYSIS, ROUTINE W REFLEX MICROSCOPIC
Bilirubin Urine: NEGATIVE
Glucose, UA: NEGATIVE mg/dL
Hgb urine dipstick: NEGATIVE
Specific Gravity, Urine: <1.005 — ABNORMAL LOW (ref 1.005–1.030)
Urobilinogen, UA: 0.2 mg/dL (ref 0.0–1.0)
pH: 6.5 (ref 5.0–8.0)

## 2011-05-31 LAB — COMPREHENSIVE METABOLIC PANEL
AST: 16 U/L (ref 0–37)
BUN: 6 mg/dL (ref 6–23)
CO2: 24 mEq/L (ref 19–32)
Calcium: 9.2 mg/dL (ref 8.4–10.5)
Chloride: 101 mEq/L (ref 96–112)
Creatinine, Ser: 0.53 mg/dL (ref 0.50–1.10)
GFR calc Af Amer: >90 mL/min (ref >90)
GFR calc non Af Amer: >90 mL/min (ref >90)
Glucose, Bld: 86 mg/dL (ref 70–99)
Total Bilirubin: 0.1 mg/dL — ABNORMAL LOW (ref 0.3–1.2)

## 2011-05-31 LAB — CBC
HCT: 30 % — ABNORMAL LOW (ref 36.0–46.0)
Hemoglobin: 10.3 g/dL — ABNORMAL LOW (ref 12.0–15.0)
MCH: 31.3 pg (ref 26.0–34.0)
MCV: 91.2 fL (ref 78.0–100.0)
Platelets: 285 10*3/uL (ref 150–400)
RBC: 3.29 MIL/uL — ABNORMAL LOW (ref 3.87–5.11)
WBC: 9.4 10*3/uL (ref 4.0–10.5)

## 2011-05-31 MED ORDER — LACTATED RINGERS IV BOLUS (SEPSIS)
1000.0000 mL | Freq: Once | INTRAVENOUS | Status: AC
  Administered 2011-05-31: 1000 mL via INTRAVENOUS

## 2011-05-31 MED ORDER — ONDANSETRON 4 MG PO TBDP: 4.0000 mg | ORAL_TABLET | Freq: Three times a day (TID) | ORAL | Status: AC | PRN

## 2011-05-31 MED ORDER — ONDANSETRON HCL 4 MG/2ML IJ SOLN
4.0000 mg | Freq: Once | INTRAMUSCULAR | Status: AC
  Administered 2011-05-31: 4 mg via INTRAVENOUS
  Filled 2011-05-31: qty 2

## 2011-05-31 MED ORDER — PROMETHAZINE HCL 25 MG/ML IJ SOLN
25.0000 mg | Freq: Once | INTRAMUSCULAR | Status: AC
  Administered 2011-05-31: 25 mg via INTRAVENOUS
  Filled 2011-05-31: qty 1

## 2011-05-31 NOTE — MAU Note (Signed)
Pt had been seen going out, thought she was going to find s.o. After discussing pain medication, waiting on P.A. Return. Unable to locate pt, not outside.  dc'd pt from board.

## 2011-05-31 NOTE — Discharge Instructions (Signed)
B.R.A.T. Diet Your doctor has recommended the B.R.A.T. diet for you or your child until the condition improves. This is often used to help control diarrhea and vomiting symptoms. If you or your child can tolerate clear liquids, you may have:  Bananas.   Kristie Sandoval.   Applesauce.   Toast (and other simple starches such as crackers, potatoes, noodles).  Be sure to avoid dairy products, meats, and fatty foods until symptoms are better. Fruit juices such as apple, grape, and prune juice can make diarrhea worse. Avoid these. Continue this diet for 2 days or as instructed by your caregiver. Document Released: 03/07/2005 Document Revised: 02/24/2011 Document Reviewed: 08/24/2006 Exit[employer] Patient Information 2012 [employer]. 

## 2011-05-31 NOTE — MAU Provider Note (Signed)
Attestation of Attending Supervision of Advanced Practitioner: Evaluation and management procedures were performed by the PA/NP/CNM/OB Fellow under my supervision/collaboration. Chart reviewed, and agree with management and plan.  Aunya Lemler, M.D. 05/31/2011 1:01 PM   

## 2011-05-31 NOTE — MAU Note (Signed)
Had talked with Damian Leavell NP regarding ? Med for HA, PA off unit.  Pt had responded well to fluids, HA had gone from 10 to a 2 on pain scale.  Not planning to medicate due to that response, feel hydration related.   Explained this to pt, she states HA is coming back, now it is a 6.  Explained will need to wait for PA to return

## 2011-05-31 NOTE — MAU Provider Note (Signed)
History   Pt is currently 17.6wks c/o severe N&V for the past 2 days. She states she can no longer keep anything on her stomach. She also c/o severe HA. She denies lower abd pain, vag dc, bleeding, fever, or diarrhea.  CSN: [id number]  Arrival date and time: 05/31/11 0820   None     Chief Complaint  Patient presents with  . Headache   HPI  OB History    Grav Para Term Preterm Abortions TAB SAB Ect Mult Living   2 0 0 0 1 0 1 0 0 0       Past Medical History  Diagnosis Date  . Trichomonas     Past Surgical History  Procedure Date  . No past surgeries     Family History  Problem Relation Age of Onset  . Anesthesia problems Neg Hx   . Diabetes Maternal Grandmother     History  Substance Use Topics  . Smoking status: Former Smoker -- 0.2 packs/day    Types: Cigarettes    Quit date: 02/18/2011  . Smokeless tobacco: Never Used  . Alcohol Use: No    Allergies: No Known Allergies  Prescriptions prior to admission  Medication Sig Dispense Refill  . acetaminophen (TYLENOL) 500 MG tablet Take 500 mg by mouth every 6 (six) hours as needed. For headaches.      . famotidine (PEPCID) 20 MG tablet Take 1 tablet (20 mg total) by mouth 2 (two) times daily.  20 tablet  0  . metoCLOPramide (REGLAN) 10 MG tablet Take 10 mg by mouth 4 (four) times daily.      . ondansetron (ZOFRAN) 8 MG tablet Take by mouth every 8 (eight) hours as needed. For upset stomach.        Review of Systems  Constitutional: Positive for malaise/fatigue. Negative for fever and chills.  HENT: Negative for hearing loss.   Eyes: Negative for blurred vision, double vision, photophobia, pain, discharge and redness.  Respiratory: Negative for cough, hemoptysis, sputum production, shortness of breath and wheezing.   Cardiovascular: Negative for chest pain and palpitations.  Gastrointestinal: Positive for nausea, vomiting and abdominal pain. Negative for diarrhea, constipation and blood in stool.    Genitourinary: Negative for dysuria, urgency, frequency and hematuria.  Neurological: Positive for headaches. Negative for dizziness.  Psychiatric/Behavioral: Negative for depression and suicidal ideas.   Physical Exam   Blood pressure 112/69, pulse 82, temperature 98.3 F (36.8 C), temperature source Oral, resp. rate 20, last menstrual period 02/05/2011.  Physical Exam  Nursing note and vitals reviewed. Constitutional: She is oriented to person, place, and time. She appears well-developed and well-nourished. No distress.  HENT:  Head: Normocephalic and atraumatic.  Eyes: EOM are normal. Pupils are equal, round, and reactive to light.  GI: Soft. She exhibits no distension and no mass. There is no tenderness. There is no rebound and no guarding.  Neurological: She is alert and oriented to person, place, and time.  Skin: Skin is warm and dry. She is not diaphoretic.  Psychiatric: She has a normal mood and affect. Her behavior is normal. Judgment and thought content normal.    MAU Course  Procedures  Results for orders placed during the hospital encounter of 05/31/11 (from the past 24 hour(s))  CBC     Status: Abnormal   Collection Time   05/31/11  8:35 AM      Component Value Range   WBC 9.4  4.0 - 10.5 (K/uL)   RBC 3.29 (*) 3.87 -  5.11 (MIL/uL)   Hemoglobin 10.3 (*) 12.0 - 15.0 (g/dL)   HCT 11.9 (*) 14.7 - 46.0 (%)   MCV 91.2  78.0 - 100.0 (fL)   MCH 31.3  26.0 - 34.0 (pg)   MCHC 34.3  30.0 - 36.0 (g/dL)   RDW 82.9  56.2 - 13.0 (%)   Platelets 285  150 - 400 (K/uL)  COMPREHENSIVE METABOLIC PANEL     Status: Abnormal   Collection Time   05/31/11  8:35 AM      Component Value Range   Sodium 135  135 - 145 (mEq/L)   Potassium 3.2 (*) 3.5 - 5.1 (mEq/L)   Chloride 101  96 - 112 (mEq/L)   CO2 24  19 - 32 (mEq/L)   Glucose, Bld 86  70 - 99 (mg/dL)   BUN 6  6 - 23 (mg/dL)   Creatinine, Ser 8.65  0.50 - 1.10 (mg/dL)   Calcium 9.2  8.4 - 78.4 (mg/dL)   Total Protein 6.7  6.0 -  8.3 (g/dL)   Albumin 3.0 (*) 3.5 - 5.2 (g/dL)   AST 16  0 - 37 (U/L)   ALT 16  0 - 35 (U/L)   Alkaline Phosphatase 46  39 - 117 (U/L)   Total Bilirubin 0.1 (*) 0.3 - 1.2 (mg/dL)   GFR calc non Af Amer >90  >90 (mL/min)   GFR calc Af Amer >90  >90 (mL/min)  URINALYSIS, ROUTINE W REFLEX MICROSCOPIC     Status: Abnormal   Collection Time   05/31/11  9:42 AM      Component Value Range   Color, Urine YELLOW  YELLOW    APPearance CLEAR  CLEAR    Specific Gravity, Urine <1.005 (*) 1.005 - 1.030    pH 6.5  5.0 - 8.0    Glucose, UA NEGATIVE  NEGATIVE (mg/dL)   Hgb urine dipstick NEGATIVE  NEGATIVE    Bilirubin Urine NEGATIVE  NEGATIVE    Ketones, ur NEGATIVE  NEGATIVE (mg/dL)   Protein, ur NEGATIVE  NEGATIVE (mg/dL)   Urobilinogen, UA 0.2  0.0 - 1.0 (mg/dL)   Nitrite NEGATIVE  NEGATIVE    Leukocytes, UA NEGATIVE  NEGATIVE      Pt sx resolved following IV hydration and antiemetics. Assessment and Plan  N&V: discussed with pt at length. Will dc to home with Rx for zofran. She will f/u with her OB provider. Discussed diet, activity, risks, and precautions.  Clinton Gallant. Breyona Swander III, DrHSc, MPAS, PA-C  05/31/2011, 8:54 AM

## 2011-05-31 NOTE — MAU Note (Signed)
Headache started on Sunday, has taken tylenol, no relief.  N/v started at that time also.  Denies prior migraines with preg.

## 2011-05-31 NOTE — MAU Note (Signed)
Pt arrived by EMS.  Labs drawn, plan discussed with pt, after PA in.

## 2011-06-08 ENCOUNTER — Other Ambulatory Visit (HOSPITAL_COMMUNITY): Payer: Self-pay | Admitting: Physician Assistant

## 2011-06-08 ENCOUNTER — Other Ambulatory Visit (HOSPITAL_COMMUNITY): Payer: Self-pay | Admitting: Family

## 2011-06-08 DIAGNOSIS — R319 Hematuria, unspecified: Secondary | ICD-10-CM

## 2011-06-08 DIAGNOSIS — Z3689 Encounter for other specified antenatal screening: Secondary | ICD-10-CM

## 2011-06-10 ENCOUNTER — Ambulatory Visit (HOSPITAL_COMMUNITY)
Admission: RE | Admit: 2011-06-10 | Discharge: 2011-06-10 | Disposition: A | Discharge status: Home / Self Care | Payer: Medicaid Other | Source: Ambulatory Visit | Attending: Physician Assistant | Admitting: Physician Assistant

## 2011-06-10 ENCOUNTER — Ambulatory Visit (HOSPITAL_COMMUNITY)
Admission: RE | Admit: 2011-06-10 | Discharge: 2011-06-10 | Disposition: A | Discharge status: Home / Self Care | Payer: Medicaid Other | Source: Ambulatory Visit | Attending: Family | Admitting: Family

## 2011-06-10 DIAGNOSIS — Z3689 Encounter for other specified antenatal screening: Secondary | ICD-10-CM

## 2011-06-10 DIAGNOSIS — O358XX0 Maternal care for other (suspected) fetal abnormality and damage, not applicable or unspecified: Secondary | ICD-10-CM | POA: Insufficient documentation

## 2011-06-10 DIAGNOSIS — R319 Hematuria, unspecified: Secondary | ICD-10-CM | POA: Insufficient documentation

## 2011-06-10 DIAGNOSIS — Z363 Encounter for antenatal screening for malformations: Secondary | ICD-10-CM | POA: Insufficient documentation

## 2011-06-10 DIAGNOSIS — O99891 Other specified diseases and conditions complicating pregnancy: Secondary | ICD-10-CM | POA: Insufficient documentation

## 2011-06-10 DIAGNOSIS — Z1389 Encounter for screening for other disorder: Secondary | ICD-10-CM | POA: Insufficient documentation

## 2011-06-24 ENCOUNTER — Encounter (HOSPITAL_COMMUNITY): Payer: Self-pay

## 2011-06-24 ENCOUNTER — Inpatient Hospital Stay (HOSPITAL_COMMUNITY)
Admission: AD | Admit: 2011-06-24 | Discharge: 2011-06-24 | Disposition: A | Discharge status: Home / Self Care | Payer: Medicaid Other | Source: Ambulatory Visit | Attending: Obstetrics & Gynecology | Admitting: Obstetrics & Gynecology

## 2011-06-24 DIAGNOSIS — O219 Vomiting of pregnancy, unspecified: Secondary | ICD-10-CM

## 2011-06-24 DIAGNOSIS — O99891 Other specified diseases and conditions complicating pregnancy: Secondary | ICD-10-CM | POA: Insufficient documentation

## 2011-06-24 DIAGNOSIS — O210 Mild hyperemesis gravidarum: Secondary | ICD-10-CM | POA: Insufficient documentation

## 2011-06-24 DIAGNOSIS — R51 Headache: Secondary | ICD-10-CM

## 2011-06-24 LAB — URINALYSIS, ROUTINE W REFLEX MICROSCOPIC
Nitrite: NEGATIVE
Protein, ur: NEGATIVE mg/dL
Specific Gravity, Urine: 1.01 (ref 1.005–1.030)
Urobilinogen, UA: 0.2 mg/dL (ref 0.0–1.0)

## 2011-06-24 LAB — COMPREHENSIVE METABOLIC PANEL
Alkaline Phosphatase: 46 U/L (ref 39–117)
BUN: 4 mg/dL — ABNORMAL LOW (ref 6–23)
Calcium: 9.3 mg/dL (ref 8.4–10.5)
Creatinine, Ser: 0.48 mg/dL — ABNORMAL LOW (ref 0.50–1.10)
GFR calc Af Amer: >90 mL/min (ref >90)
Glucose, Bld: 73 mg/dL (ref 70–99)
Total Protein: 6.7 g/dL (ref 6.0–8.3)

## 2011-06-24 LAB — CBC
Hemoglobin: 10 g/dL — ABNORMAL LOW (ref 12.0–15.0)
MCH: 30.9 pg (ref 26.0–34.0)
MCHC: 32.9 g/dL (ref 30.0–36.0)
MCV: 93.8 fL (ref 78.0–100.0)
RBC: 3.24 MIL/uL — ABNORMAL LOW (ref 3.87–5.11)

## 2011-06-24 LAB — URINE MICROSCOPIC-ADD ON

## 2011-06-24 MED ORDER — LACTATED RINGERS IV SOLN
INTRAVENOUS | Status: DC
  Administered 2011-06-24: 14:00:00 via INTRAVENOUS

## 2011-06-24 MED ORDER — METOCLOPRAMIDE HCL 5 MG/ML IJ SOLN
10.0000 mg | Freq: Once | INTRAMUSCULAR | Status: AC
  Administered 2011-06-24: 10 mg via INTRAVENOUS
  Filled 2011-06-24: qty 2

## 2011-06-24 MED ORDER — DIPHENHYDRAMINE HCL 50 MG/ML IJ SOLN
25.0000 mg | Freq: Once | INTRAMUSCULAR | Status: AC
  Administered 2011-06-24: 25 mg via INTRAVENOUS
  Filled 2011-06-24: qty 1

## 2011-06-24 MED ORDER — ONDANSETRON 8 MG PO TBDP: 8.0000 mg | ORAL_TABLET | Freq: Three times a day (TID) | ORAL | Status: AC | PRN

## 2011-06-24 MED ORDER — DEXAMETHASONE SODIUM PHOSPHATE 10 MG/ML IJ SOLN
10.0000 mg | Freq: Once | INTRAMUSCULAR | Status: AC
  Administered 2011-06-24: 10 mg via INTRAVENOUS
  Filled 2011-06-24: qty 1

## 2011-06-24 NOTE — MAU Provider Note (Signed)
History     CSN: [id number]  Arrival date & time 06/24/11  1210   None     Chief Complaint  Patient presents with  . Headache  . Abdominal Pain  . Emesis   HPI Rmani L Bee is a 26 y.o. female @ 21 weeks 2days gestation who presents to MAU for headache. States she began vomiting 2 days ago with headache and has gotten progressively worse. Has been taking Zofran and Reglan for nausea and tylenol for headache but vomits all the medication. The headache is located in the frontal region and extends to the temporal area. She denies visual changes or neck pain. She does complain of photophobia. No history of migraines. Epigastric pain from vomiting.    Past Medical History  Diagnosis Date  . Trichomonas     Past Surgical History  Procedure Date  . No past surgeries     Family History  Problem Relation Age of Onset  . Anesthesia problems Neg Hx   . Diabetes Maternal Grandmother     History  Substance Use Topics  . Smoking status: Former Smoker -- 0.2 packs/day    Types: Cigarettes    Quit date: 02/18/2011  . Smokeless tobacco: Never Used  . Alcohol Use: No    OB History    Grav Para Term Preterm Abortions TAB SAB Ect Mult Living   2 0 0 0 1 0 1 0 0 0       Review of Systems  Constitutional: Positive for appetite change. Negative for fever and chills.  HENT: Negative for hearing loss, congestion, trouble swallowing, neck pain and neck stiffness.   Eyes: Positive for photophobia. Negative for discharge, itching and visual disturbance.  Respiratory: Negative for cough and wheezing.   Cardiovascular: Negative.   Gastrointestinal: Positive for nausea, vomiting and abdominal pain. Negative for diarrhea and constipation.  Genitourinary: Negative for dysuria, urgency, frequency and pelvic pain.  Musculoskeletal: Negative for back pain.  Skin: Negative.   Neurological: Positive for headaches. Negative for dizziness.  Psychiatric/Behavioral: Negative for confusion.     Allergies  Review of patient's allergies indicates no known allergies.  Home Medications  No current outpatient prescriptions on file.  BP 110/62  Pulse 80  Temp(Src) 97.7 F (36.5 C) (Oral)  Resp 16  Ht 5' 3.5" (1.613 m)  Wt 159 lb 3.2 oz (72.213 kg)  BMI 27.76 kg/m2  SpO2 100%  LMP 02/05/2011  Physical Exam  Nursing note and vitals reviewed. Constitutional: She is oriented to person, place, and time. She appears well-developed and well-nourished. No distress.  HENT:  Head: Normocephalic.       Tender with palpation of forehead and temporal area.  Eyes: EOM are normal.  Neck: Normal range of motion. Neck supple.  Cardiovascular: Normal rate.   Pulmonary/Chest: Effort normal.  Abdominal: Soft. There is no tenderness.  Musculoskeletal: Normal range of motion.  Neurological: She is alert and oriented to person, place, and time. She has normal strength. No cranial nerve deficit or sensory deficit.  Skin: Skin is warm and dry.  Psychiatric: She has a normal mood and affect. Her behavior is normal. Judgment and thought content normal.   Results for orders placed during the hospital encounter of 06/24/11 (from the past 24 hour(s))  URINALYSIS, ROUTINE W REFLEX MICROSCOPIC     Status: Abnormal   Collection Time   06/24/11 12:35 PM      Component Value Range   Color, Urine YELLOW  YELLOW    APPearance CLEAR  CLEAR    Specific Gravity, Urine 1.010  1.005 - 1.030    pH 7.5  5.0 - 8.0    Glucose, UA NEGATIVE  NEGATIVE (mg/dL)   Hgb urine dipstick TRACE (*) NEGATIVE    Bilirubin Urine NEGATIVE  NEGATIVE    Ketones, ur NEGATIVE  NEGATIVE (mg/dL)   Protein, ur NEGATIVE  NEGATIVE (mg/dL)   Urobilinogen, UA 0.2  0.0 - 1.0 (mg/dL)   Nitrite NEGATIVE  NEGATIVE    Leukocytes, UA NEGATIVE  NEGATIVE   URINE MICROSCOPIC-ADD ON     Status: Normal   Collection Time   06/24/11 12:35 PM      Component Value Range   Squamous Epithelial / LPF RARE  RARE    WBC, UA 0-2  <3 (WBC/hpf)    RBC / HPF 0-2  <3 (RBC/hpf)  CBC     Status: Abnormal   Collection Time   06/24/11  2:28 PM      Component Value Range   WBC 8.5  4.0 - 10.5 (K/uL)   RBC 3.24 (*) 3.87 - 5.11 (MIL/uL)   Hemoglobin 10.0 (*) 12.0 - 15.0 (g/dL)   HCT 96.0 (*) 45.4 - 46.0 (%)   MCV 93.8  78.0 - 100.0 (fL)   MCH 30.9  26.0 - 34.0 (pg)   MCHC 32.9  30.0 - 36.0 (g/dL)   RDW 09.8  11.9 - 14.7 (%)   Platelets 283  150 - 400 (K/uL)  COMPREHENSIVE METABOLIC PANEL     Status: Abnormal   Collection Time   06/24/11  2:28 PM      Component Value Range   Sodium 134 (*) 135 - 145 (mEq/L)   Potassium 4.0  3.5 - 5.1 (mEq/L)   Chloride 102  96 - 112 (mEq/L)   CO2 23  19 - 32 (mEq/L)   Glucose, Bld 73  70 - 99 (mg/dL)   BUN 4 (*) 6 - 23 (mg/dL)   Creatinine, Ser 8.29 (*) 0.50 - 1.10 (mg/dL)   Calcium 9.3  8.4 - 56.2 (mg/dL)   Total Protein 6.7  6.0 - 8.3 (g/dL)   Albumin 3.0 (*) 3.5 - 5.2 (g/dL)   AST 15  0 - 37 (U/L)   ALT 12  0 - 35 (U/L)   Alkaline Phosphatase 46  39 - 117 (U/L)   Total Bilirubin 0.2 (*) 0.3 - 1.2 (mg/dL)   GFR calc non Af Amer >90  >90 (mL/min)   GFR calc Af Amer >90  >90 (mL/min)   Assessment: Headache in second trimester pregnancy   Nausea and vomiting during pregnancy   Plan:  IV hydration   Benadryl 25 mg IV   Decadron 10 mg IV   Reglan 10 mg IV   Return as needed   Zantac Rx    ED Course: Patient feeling much better after IV and mediation. No nausea, headache gone. Will d/c home.  Procedures    MDM

## 2011-06-24 NOTE — MAU Note (Signed)
Patient state states she has had vomiting for the past 2 days, has a headache that Tylenol does not relieve. Has abdominal cramping when vomiting. Denies any bleeding or discharge.

## 2011-06-24 NOTE — Discharge Instructions (Signed)
General Headache, Without Cause A general headache has no specific cause. These headaches are not life-threatening. They will not lead to other types of headaches. HOME CARE   Make and keep follow-up visits with your doctor.   Only take medicine as told by your doctor.   Try to relax, get a massage, or use your thoughts to control your body (biofeedback).   Apply cold or heat to the head and neck. Apply 3 or 4 times a day or as needed.  Finding out the results of your test Ask when your test results will be ready. Make sure you get your test results. GET HELP RIGHT AWAY IF:   You have problems with medicine.   Your medicine does not help relieve pain.   Your headache changes or becomes worse.   You feel sick to your stomach (nauseous) or throw up (vomit).   You have a temperature by mouth above 102 F (38.9 C), not controlled by medicine.   Your have a stiff neck.   You have vision loss.   You have muscle weakness.   You lose control of your muscles.   You lose balance or have trouble walking.   You feel like you are going to pass out (faint).  MAKE SURE YOU:   Understand these instructions.   Will watch this condition.   Will get help right away if you are not doing well or get worse.  Document Released: 12/15/2007 Document Revised: 02/24/2011 Document Reviewed: 12/15/2007 [employer] Patient Information 2012 [employer]. 

## 2011-06-27 ENCOUNTER — Inpatient Hospital Stay (HOSPITAL_COMMUNITY)
Admission: AD | Admit: 2011-06-27 | Discharge: 2011-06-27 | Disposition: A | Discharge status: Home / Self Care | Payer: Medicaid Other | Source: Ambulatory Visit | Attending: Obstetrics and Gynecology | Admitting: Obstetrics and Gynecology

## 2011-06-27 DIAGNOSIS — R51 Headache: Secondary | ICD-10-CM | POA: Insufficient documentation

## 2011-06-27 DIAGNOSIS — O219 Vomiting of pregnancy, unspecified: Secondary | ICD-10-CM

## 2011-06-27 DIAGNOSIS — O26899 Other specified pregnancy related conditions, unspecified trimester: Secondary | ICD-10-CM

## 2011-06-27 DIAGNOSIS — O210 Mild hyperemesis gravidarum: Secondary | ICD-10-CM | POA: Insufficient documentation

## 2011-06-27 LAB — URINALYSIS, ROUTINE W REFLEX MICROSCOPIC
Ketones, ur: NEGATIVE mg/dL
Leukocytes, UA: NEGATIVE
Nitrite: NEGATIVE
Specific Gravity, Urine: 1.015 (ref 1.005–1.030)
pH: 7.5 (ref 5.0–8.0)

## 2011-06-27 MED ORDER — LACTATED RINGERS IV BOLUS (SEPSIS)
1000.0000 mL | Freq: Once | INTRAVENOUS | Status: AC
  Administered 2011-06-27: 1000 mL via INTRAVENOUS

## 2011-06-27 MED ORDER — METOCLOPRAMIDE HCL 5 MG/ML IJ SOLN
10.0000 mg | Freq: Once | INTRAMUSCULAR | Status: AC
  Administered 2011-06-27: 10 mg via INTRAVENOUS
  Filled 2011-06-27: qty 2

## 2011-06-27 MED ORDER — PROMETHAZINE HCL 25 MG/ML IJ SOLN
12.5000 mg | Freq: Four times a day (QID) | INTRAMUSCULAR | Status: DC | PRN
  Administered 2011-06-27: 12.5 mg via INTRAVENOUS
  Filled 2011-06-27: qty 1

## 2011-06-27 MED ORDER — BUTALBITAL-APAP-CAFFEINE 50-325-40 MG PO TABS: 1.0000 | ORAL_TABLET | Freq: Four times a day (QID) | ORAL | Status: DC | PRN

## 2011-06-27 MED ORDER — PROMETHAZINE HCL 25 MG PO TABS: 25.0000 mg | ORAL_TABLET | Freq: Four times a day (QID) | ORAL | Status: DC | PRN

## 2011-06-27 MED ORDER — DEXAMETHASONE SODIUM PHOSPHATE 10 MG/ML IJ SOLN
10.0000 mg | Freq: Once | INTRAMUSCULAR | Status: AC
  Administered 2011-06-27: 10 mg via INTRAVENOUS
  Filled 2011-06-27: qty 1

## 2011-06-27 NOTE — MAU Provider Note (Signed)
History     CSN: [id number]  Arrival date and time: 06/27/11 1243   None     Chief Complaint  Patient presents with  . Headache  . Emesis   HPI 26 y.o. G2P0010 at [redacted]w[redacted]d with headache and nausea/vomiting since last night. States she hasn't been able to keep anything down, reglan and zofran not helping. Tylenol and Motrin not helping with headache. Seen on 4/5 with same symptoms, had relief with IV fluids, bendaryl, decadron, reglan. States headache returned the next day. States repeatedly that she needs something for pain to go home with.    Past Medical History  Diagnosis Date  . Trichomonas     Past Surgical History  Procedure Date  . No past surgeries     Family History  Problem Relation Age of Onset  . Anesthesia problems Neg Hx   . Diabetes Maternal Grandmother     History  Substance Use Topics  . Smoking status: Former Smoker -- 0.2 packs/day    Types: Cigarettes    Quit date: 02/18/2011  . Smokeless tobacco: Never Used  . Alcohol Use: No    Allergies: No Known Allergies  Prescriptions prior to admission  Medication Sig Dispense Refill  . acetaminophen (TYLENOL) 500 MG tablet Take 500 mg by mouth every 6 (six) hours as needed. For headaches.      . famotidine (PEPCID) 20 MG tablet Take 1 tablet (20 mg total) by mouth 2 (two) times daily.  20 tablet  0  . metoCLOPramide (REGLAN) 10 MG tablet Take 10 mg by mouth 4 (four) times daily.      . ondansetron (ZOFRAN ODT) 8 MG disintegrating tablet Take 1 tablet (8 mg total) by mouth every 8 (eight) hours as needed for nausea.  20 tablet  0  . ondansetron (ZOFRAN) 8 MG tablet Take 8 mg by mouth every 8 (eight) hours as needed. For nausea        Review of Systems  Constitutional: Negative.   Respiratory: Negative.   Cardiovascular: Negative.   Gastrointestinal: Positive for nausea and vomiting. Negative for abdominal pain, diarrhea and constipation.  Genitourinary: Negative for dysuria, urgency, frequency,  hematuria and flank pain.       Negative for vaginal bleeding, cramping/contractions  Musculoskeletal: Negative.   Neurological: Positive for headaches.  Psychiatric/Behavioral: Negative.    Physical Exam   Height 5\' 4"  (1.626 m), weight 159 lb 4 oz (72.235 kg), last menstrual period 02/05/2011.  Physical Exam  Nursing note and vitals reviewed. Constitutional: She is oriented to person, place, and time. She appears well-developed and well-nourished. No distress.  HENT:  Head: Normocephalic and atraumatic.  Neck: Normal range of motion.  Cardiovascular: Normal rate.   Respiratory: Effort normal.  GI: Soft. There is no tenderness.  Musculoskeletal: Normal range of motion.  Neurological: She is alert and oriented to person, place, and time. No cranial nerve deficit.  Skin: Skin is warm and dry.  Psychiatric: She has a normal mood and affect.    MAU Course  Procedures  Results for orders placed during the hospital encounter of 06/27/11 (from the past 24 hour(s))  URINALYSIS, ROUTINE W REFLEX MICROSCOPIC     Status: Normal   Collection Time   06/27/11 12:56 PM      Component Value Range   Color, Urine YELLOW  YELLOW    APPearance CLEAR  CLEAR    Specific Gravity, Urine 1.015  1.005 - 1.030    pH 7.5  5.0 - 8.0  Glucose, UA NEGATIVE  NEGATIVE (mg/dL)   Hgb urine dipstick NEGATIVE  NEGATIVE    Bilirubin Urine NEGATIVE  NEGATIVE    Ketones, ur NEGATIVE  NEGATIVE (mg/dL)   Protein, ur NEGATIVE  NEGATIVE (mg/dL)   Urobilinogen, UA 0.2  0.0 - 1.0 (mg/dL)   Nitrite NEGATIVE  NEGATIVE    Leukocytes, UA NEGATIVE  NEGATIVE    Pain/nausea improved with IV fluids, Decadron 10 mg, Reglan 10 mg, Phenergan 12.5 IV.   Assessment and Plan  26 y.o. G2P0010 at [redacted]w[redacted]d Rx Fiorcet and Phenergan F/U as scheduled at [facility] for prenatal care  [facility] 06/27/2011, 1:18 PM

## 2011-06-27 NOTE — MAU Note (Signed)
Patient is here with c/o headache that have been constant throughout her pregnancy but is severe today. She states that she was evaluated here a few days ago but was not prescribed any pain medicine. She states that she has been vomiting and epigastric pain.

## 2011-06-28 NOTE — MAU Provider Note (Signed)
Agree with above note.  Ehan Freas 06/28/2011 7:24 AM   

## 2011-06-30 ENCOUNTER — Encounter (HOSPITAL_COMMUNITY): Payer: Self-pay | Admitting: *Deleted

## 2011-06-30 ENCOUNTER — Inpatient Hospital Stay (HOSPITAL_COMMUNITY)
Admission: AD | Admit: 2011-06-30 | Discharge: 2011-06-30 | Disposition: A | Discharge status: Home / Self Care | Payer: Medicaid Other | Source: Ambulatory Visit | Attending: Obstetrics and Gynecology | Admitting: Obstetrics and Gynecology

## 2011-06-30 DIAGNOSIS — O210 Mild hyperemesis gravidarum: Secondary | ICD-10-CM | POA: Insufficient documentation

## 2011-06-30 DIAGNOSIS — R51 Headache: Secondary | ICD-10-CM | POA: Insufficient documentation

## 2011-06-30 DIAGNOSIS — O26899 Other specified pregnancy related conditions, unspecified trimester: Secondary | ICD-10-CM

## 2011-06-30 DIAGNOSIS — O99891 Other specified diseases and conditions complicating pregnancy: Secondary | ICD-10-CM | POA: Insufficient documentation

## 2011-06-30 HISTORY — DX: Headache: R51

## 2011-06-30 LAB — URINALYSIS, ROUTINE W REFLEX MICROSCOPIC
Bilirubin Urine: NEGATIVE
Ketones, ur: NEGATIVE mg/dL
Nitrite: NEGATIVE
Specific Gravity, Urine: 1.015 (ref 1.005–1.030)
Urobilinogen, UA: 0.2 mg/dL (ref 0.0–1.0)

## 2011-06-30 MED ORDER — CYCLOBENZAPRINE HCL 5 MG PO TABS: 5.0000 mg | ORAL_TABLET | Freq: Two times a day (BID) | ORAL | Status: DC | PRN

## 2011-06-30 MED ORDER — FAMOTIDINE 20 MG PO TABS: 20.0000 mg | ORAL_TABLET | Freq: Two times a day (BID) | ORAL | Status: DC

## 2011-06-30 MED ORDER — SODIUM CHLORIDE 0.9 % IV BOLUS (SEPSIS)
1000.0000 mL | Freq: Once | INTRAVENOUS | Status: AC
  Administered 2011-06-30: 1000 mL via INTRAVENOUS

## 2011-06-30 MED ORDER — DIPHENHYDRAMINE HCL 50 MG/ML IJ SOLN
25.0000 mg | Freq: Once | INTRAMUSCULAR | Status: AC
  Administered 2011-06-30: 10:00:00 via INTRAVENOUS
  Filled 2011-06-30: qty 1

## 2011-06-30 MED ORDER — METOCLOPRAMIDE HCL 5 MG/ML IJ SOLN
10.0000 mg | Freq: Once | INTRAMUSCULAR | Status: AC
  Administered 2011-06-30: 10 mg via INTRAVENOUS
  Filled 2011-06-30: qty 2

## 2011-06-30 NOTE — MAU Provider Note (Signed)
History     CSN: [id number]  Arrival date and time: 06/30/11 4098   First Provider Initiated Contact with Patient 06/30/11 [phone]      Chief Complaint  Patient presents with  . Nausea  . Headache   HPI  26yo [redacted]w[redacted]d complaining of HA and nausea and vomiting. Pt seen multiple times in MAU w/ similar complaints and recently sent home w/ fioricet and phenergan. Pt reports HA is worse after big meals (No HA after eating smaller lighter meals) and is throbbing in nature and includes photophobia and phonophobia. Denies vision/sensory change or loss of consciousness. Pt states that fioricet and 800mg  ibupofen not helping. Denies any h/o HA/migraines prior to pregnancy. PO OK per pt. Nausea only associated w/ HA. Continues to have regular BM and voiding well. Denies sick contacts.    OB History    Grav Para Term Preterm Abortions TAB SAB Ect Mult Living   2 0 0 0 1 0 1 0 0 0       Past Medical History  Diagnosis Date  . Trichomonas   . Headache     no hx prior to preg    Past Surgical History  Procedure Date  . No past surgeries     Family History  Problem Relation Age of Onset  . Anesthesia problems Neg Hx   . Diabetes Maternal Grandmother     History  Substance Use Topics  . Smoking status: Former Smoker -- 0.2 packs/day    Types: Cigarettes    Quit date: 02/18/2011  . Smokeless tobacco: Never Used  . Alcohol Use: No    Allergies: No Known Allergies  Prescriptions prior to admission  Medication Sig Dispense Refill  . acetaminophen (TYLENOL) 500 MG tablet Take 500 mg by mouth every 6 (six) hours as needed. For headaches.      . butalbital-acetaminophen-caffeine (FIORICET) 50-325-40 MG per tablet Take 1-2 tablets by mouth every 6 (six) hours as needed for headache.  20 tablet  0  . famotidine (PEPCID) 20 MG tablet Take 1 tablet (20 mg total) by mouth 2 (two) times daily.  20 tablet  0  . metoCLOPramide (REGLAN) 10 MG tablet Take 10 mg by mouth 4 (four) times daily.        . ondansetron (ZOFRAN ODT) 8 MG disintegrating tablet Take 1 tablet (8 mg total) by mouth every 8 (eight) hours as needed for nausea.  20 tablet  0  . ondansetron (ZOFRAN) 8 MG tablet Take 8 mg by mouth every 8 (eight) hours as needed. For nausea      . promethazine (PHENERGAN) 25 MG tablet Take 1 tablet (25 mg total) by mouth every 6 (six) hours as needed for nausea.  60 tablet  0  . promethazine (PHENERGAN) 25 MG tablet Take 1 tablet (25 mg total) by mouth every 6 (six) hours as needed for nausea.  30 tablet  0  . promethazine (PHENERGAN) 25 MG tablet Take 1 tablet (25 mg total) by mouth every 6 (six) hours as needed for nausea. May take 1/2 tablet for milder symptoms.  Sedation precautions.  30 tablet  0  . promethazine (PHENERGAN) 25 MG tablet Take 1 tablet (25 mg total) by mouth every 6 (six) hours as needed for nausea.  30 tablet  0    Review of Systems  Constitutional: Negative for fever and chills.  HENT: Negative for hearing loss, neck pain and tinnitus.   Eyes: Positive for photophobia. Negative for blurred vision.  Respiratory: Negative for  hemoptysis.   Cardiovascular: Negative for chest pain and palpitations.  Gastrointestinal: Positive for nausea and vomiting. Negative for heartburn, abdominal pain, diarrhea, constipation and blood in stool.  Genitourinary: Negative for dysuria, urgency, frequency and hematuria.  Musculoskeletal: Negative for myalgias.  Skin: Negative for rash.  Neurological: Positive for headaches. Negative for dizziness, tingling, sensory change and speech change.   Physical Exam   Blood pressure 98/55, pulse 71, temperature 98.7 F (37.1 C), temperature source Oral, resp. rate 18, last menstrual period 02/05/2011.  Physical Exam  Constitutional: She appears well-developed and well-nourished.  HENT:  Head: Normocephalic.  Eyes: EOM are normal.  Neck: Normal range of motion. Neck supple. No thyromegaly present.  Cardiovascular: Normal rate and regular  rhythm.   Respiratory: Effort normal.  GI: Soft. There is no tenderness. There is no rebound and no guarding.  Musculoskeletal: Normal range of motion.  Lymphadenopathy:    She has no cervical adenopathy.  Neurological: She is alert. No cranial nerve deficit. She exhibits normal muscle tone. Coordination normal.  Skin: Skin is warm and dry. No rash noted. No erythema.  Psychiatric: She has a normal mood and affect. Her behavior is normal. Judgment and thought content normal.    Urinalysis    Component Value Date/Time   COLORURINE YELLOW 06/30/2011 0716   APPEARANCEUR CLOUDY* 06/30/2011 0716   LABSPEC 1.015 06/30/2011 0716   PHURINE 7.0 06/30/2011 0716   GLUCOSEU NEGATIVE 06/30/2011 0716   HGBUR NEGATIVE 06/30/2011 0716   BILIRUBINUR NEGATIVE 06/30/2011 0716   KETONESUR NEGATIVE 06/30/2011 0716   PROTEINUR NEGATIVE 06/30/2011 0716   UROBILINOGEN 0.2 06/30/2011 0716   NITRITE NEGATIVE 06/30/2011 0716   LEUKOCYTESUR NEGATIVE 06/30/2011 0716       MAU Course  Procedures  MDM IVF started w/ 1L NS bolus Benadryl 25mg  IV Reglan 10mg  IV   Assessment and Plan  26yo [redacted]w[redacted]d G2P0010 w/ persistent HA. Likely due to vascular dilitation secondary to pregnancy. Pt responds well to hydration, reglan, and benadryl. Will not treat w/ Decadron at this time as pt w/ 2 courses w/in the past month. Pt advised to monitor triggers and to avoid them. Will give pt Rx for Flexeril to be used w/ Fioricet as other modalities have not worked at this point.     MERRELL, DAVID 06/30/2011, 9:20 AM   Patient seen and examined.  Agree with above note.  Candelaria Celeste JEHIEL 06/30/2011 11:37 AM

## 2011-06-30 NOTE — Discharge Instructions (Signed)
General Headache, Without Cause A general headache has no specific cause. These headaches are not life-threatening. They will not lead to other types of headaches. HOME CARE   Make and keep follow-up visits with your doctor.   Only take medicine as told by your doctor.   Try to relax, get a massage, or use your thoughts to control your body (biofeedback).   Apply cold or heat to the head and neck. Apply 3 or 4 times a day or as needed.  Finding out the results of your test Ask when your test results will be ready. Make sure you get your test results. GET HELP RIGHT AWAY IF:   You have problems with medicine.   Your medicine does not help relieve pain.   Your headache changes or becomes worse.   You feel sick to your stomach (nauseous) or throw up (vomit).   You have a temperature by mouth above 102 F (38.9 C), not controlled by medicine.   Your have a stiff neck.   You have vision loss.   You have muscle weakness.   You lose control of your muscles.   You lose balance or have trouble walking.   You feel like you are going to pass out (faint).  MAKE SURE YOU:   Understand these instructions.   Will watch this condition.   Will get help right away if you are not doing well or get worse.  Document Released: 12/15/2007 Document Revised: 02/24/2011 Document Reviewed: 12/15/2007 [employer] Patient Information 2012 [employer]. 

## 2011-06-30 NOTE — MAU Note (Signed)
Headache feels different then when here before, is light headed with this one.  Stomach feels weird  - "greasy".

## 2011-06-30 NOTE — MAU Note (Signed)
rx for pepcid sent to pharmacy.  Declined flexoril.

## 2011-06-30 NOTE — MAU Note (Signed)
Pt up to bathroom, no further gagging or vomiting noted. Pt asking about different meds for nausea.  Discussed how pt's respond to meds AND pregnancy differently, different meds work for different people.

## 2011-06-30 NOTE — MAU Note (Signed)
Tolerated fluids and crackers. Feeling a little better

## 2011-06-30 NOTE — MAU Note (Signed)
N/v started yesterday.  Headache also started yesterday.  Pt reports eating fried pork chop last night, head been killing her ever since, took fioricet and reglan- no relief.  EMS arrival, pt waiting outside when they arrived.

## 2011-07-04 ENCOUNTER — Emergency Department (HOSPITAL_COMMUNITY)
Admission: EM | Admit: 2011-07-04 | Discharge: 2011-07-04 | Disposition: A | Discharge status: Home / Self Care | Payer: Medicaid Other | Attending: Emergency Medicine | Admitting: Emergency Medicine

## 2011-07-04 ENCOUNTER — Encounter (HOSPITAL_COMMUNITY): Payer: Self-pay

## 2011-07-04 DIAGNOSIS — Z87891 Personal history of nicotine dependence: Secondary | ICD-10-CM | POA: Insufficient documentation

## 2011-07-04 DIAGNOSIS — N764 Abscess of vulva: Secondary | ICD-10-CM | POA: Insufficient documentation

## 2011-07-04 DIAGNOSIS — R51 Headache: Secondary | ICD-10-CM | POA: Insufficient documentation

## 2011-07-04 DIAGNOSIS — O2390 Unspecified genitourinary tract infection in pregnancy, unspecified trimester: Secondary | ICD-10-CM | POA: Insufficient documentation

## 2011-07-04 DIAGNOSIS — L0291 Cutaneous abscess, unspecified: Secondary | ICD-10-CM

## 2011-07-04 LAB — URINALYSIS, ROUTINE W REFLEX MICROSCOPIC
Leukocytes, UA: NEGATIVE
Nitrite: NEGATIVE
Specific Gravity, Urine: 1.016 (ref 1.005–1.030)
pH: 7 (ref 5.0–8.0)

## 2011-07-04 LAB — URINE MICROSCOPIC-ADD ON

## 2011-07-04 LAB — WET PREP, GENITAL: Trich, Wet Prep: NONE SEEN

## 2011-07-04 MED ORDER — ONDANSETRON 4 MG PO TBDP
8.0000 mg | ORAL_TABLET | Freq: Once | ORAL | Status: AC
  Administered 2011-07-04: 8 mg via ORAL
  Filled 2011-07-04: qty 2

## 2011-07-04 MED ORDER — LIDOCAINE HCL (PF) 1 % IJ SOLN
5.0000 mL | Freq: Once | INTRAMUSCULAR | Status: DC
  Filled 2011-07-04: qty 5

## 2011-07-04 MED ORDER — DIPHENHYDRAMINE HCL 25 MG PO CAPS
50.0000 mg | ORAL_CAPSULE | Freq: Once | ORAL | Status: AC
  Administered 2011-07-04: 50 mg via ORAL
  Filled 2011-07-04: qty 2

## 2011-07-04 MED ORDER — ACETAMINOPHEN 325 MG PO TABS
650.0000 mg | ORAL_TABLET | Freq: Once | ORAL | Status: AC
  Administered 2011-07-04: 650 mg via ORAL
  Filled 2011-07-04: qty 2

## 2011-07-04 MED ORDER — FAMOTIDINE 20 MG PO TABS: 20.0000 mg | ORAL_TABLET | Freq: Two times a day (BID) | ORAL | Status: DC

## 2011-07-04 NOTE — ED Notes (Signed)
Pt ambulated to restroom. 

## 2011-07-04 NOTE — ED Provider Notes (Signed)
History     CSN: [id number]  Arrival date & time 07/04/11  0802   First MD Initiated Contact with Patient 07/04/11 [phone]      Chief Complaint  Patient presents with  . Headache  . Recurrent Skin Infections    (Consider location/radiation/quality/duration/timing/severity/associated sxs/prior treatment) HPI Comments: Patient w a hx of trichomonas presents to the emergency department with a chief complaint of vaginal abscess.  She states that she is currently [redacted] weeks pregnant G2 P1.  She noticed the boil on her right labia majora 3 days ago.  Patient denies any abdominal pain, urinary symptoms including hematuria, frequency, urgency, or hesitancy.  She denies any abnormal vaginal discharge or bleeding.  Patient states that her pregnancy is not constipated.  Associated symptoms include mild headache.  No other complaints this time.  Patient is a 26 y.o. female presenting with headaches. The history is provided by the patient.  Headache  Pertinent negatives include no fever and no shortness of breath.    Past Medical History  Diagnosis Date  . Trichomonas   . Headache     no hx prior to preg    Past Surgical History  Procedure Date  . No past surgeries     Family History  Problem Relation Age of Onset  . Anesthesia problems Neg Hx   . Diabetes Maternal Grandmother     History  Substance Use Topics  . Smoking status: Former Smoker -- 0.2 packs/day    Types: Cigarettes    Quit date: 02/18/2011  . Smokeless tobacco: Never Used  . Alcohol Use: No    OB History    Grav Para Term Preterm Abortions TAB SAB Ect Mult Living   2 0 0 0 1 0 1 0 0 0       Review of Systems  Constitutional: Negative for fever, chills and appetite change.  HENT: Negative for congestion.   Eyes: Negative for visual disturbance.  Respiratory: Negative for shortness of breath.   Cardiovascular: Negative for chest pain and leg swelling.  Gastrointestinal: Negative for abdominal pain.    Genitourinary: Negative for dysuria, urgency and frequency.  Skin:       abscess  Neurological: Positive for headaches. Negative for dizziness, syncope, weakness, light-headedness and numbness.  Psychiatric/Behavioral: Negative for confusion.  All other systems reviewed and are negative.    Allergies  Review of patient's allergies indicates no known allergies.  Home Medications   Current Outpatient Rx  Name Route Sig Dispense Refill  . BUTALBITAL-APAP-CAFFEINE 50-325-40 MG PO TABS Oral Take 1 tablet by mouth every 6 (six) hours as needed. For headache    . METOCLOPRAMIDE HCL 10 MG PO TABS Oral Take 10 mg by mouth 4 (four) times daily as needed. For nausea    . ONDANSETRON 8 MG PO TBDP Oral Take 8 mg by mouth every 8 (eight) hours as needed. For nausea    . PROMETHAZINE HCL 25 MG PO TABS Oral Take 25 mg by mouth every 6 (six) hours as needed. For nausea      BP 108/63  Pulse 74  Temp(Src) 98.2 F (36.8 C) (Oral)  Resp 16  SpO2 100%  LMP 02/05/2011  Physical Exam  Nursing note and vitals reviewed. Constitutional: She is oriented to person, place, and time. She appears well-developed and well-nourished. She does not have a sickly appearance. She does not appear ill. No distress.  HENT:  Head: Normocephalic and atraumatic.  Eyes: Conjunctivae and EOM are normal.  Neck: Normal range  of motion. Neck supple.  Cardiovascular: Normal rate and regular rhythm.   Pulmonary/Chest: Effort normal and breath sounds normal.  Musculoskeletal: She exhibits no edema.  Lymphadenopathy:       Head (right side): No submental, no preauricular and no posterior auricular adenopathy present.       Head (left side): No submental, no submandibular, no preauricular and no posterior auricular adenopathy present.    She has no axillary adenopathy.  Neurological: She is alert and oriented to person, place, and time.  Skin: Skin is warm and dry. No rash noted. She is not diaphoretic.       4cm sized  abscess located on right labia majora. Extreme tenderness to palpation. Not currently draining. Abscess is fluctuant without warmth, surrounding erythema, or induration.    ED Course  Procedures (including critical care time)  Labs Reviewed - No data to display No results found.   No diagnosis found.  INCISION AND DRAINAGE Performed by: Jaci Carrel Consent: Verbal consent obtained. Risks and benefits: risks, benefits and alternatives were discussed Type: abscess  Body area: Right labia majora  Anesthesia: local infiltration  Local anesthetic: lidocaine 2% without epinephrine  Anesthetic total: 2 ml  Complexity: complex Blunt dissection to break up loculations  Drainage: purulent  Drainage amount: Mild   Packing material: 1/4 in iodoform gauze  Patient tolerance: Patient tolerated the procedure well with no immediate complications.  Pt was noticed to have abnormal vaginal dc with fishy odor when performing I&D. Pelvic exam ordered to assure no STD or yeast infection present.   Exam performed by Jaci Carrel,  exam chaperoned Date: 07/04/2011 Pelvic exam: normal external genitalia without evidence of trauma. VULVA: normal appearing vulva with no masses, tenderness or lesion. VAGINA: normal appearing vagina with normal color and discharge, no lesions. CERVIX: normal appearing cervix without lesions, cervical motion tenderness absent, cervical os closed with out purulent discharge; vaginal discharge - white, Wet prep and DNA probe for chlamydia and GC obtained.   ADNEXA: normal adnexa in size, nontender and no masses UTERUS: uterus is normal size, shape, consistency and nontender.    MDM  Abscess drainage  Patient with skin abscess amenable to incision and drainage.  Abscess was large enough to warrant packing with removal and wound recheck in 2 days. No signs of cellulitis is surrounding skin.  Will d/c to home.  No antibiotic therapy is indicated. No abnormalities  on pelvic exam or wet prep, cultures changing.          Jaci Carrel, [place] 07/04/11 1143

## 2011-07-04 NOTE — Discharge Instructions (Signed)
Followup with your doctor or an urgent care in order to remove your packing in 48-72 hours. You may return to the emergency department if you have  a fever that persists greater than 101 or your abscess appears to become infected (growing surrounding redness and warmth). Do not operate any heavy machinery while on pain medications. Do not consume alcohol on these medications either. ° °Abscess °An abscess (boil or furuncle) is an infected area that contains a collection of pus.  °SYMPTOMS °Signs and symptoms of an abscess include pain, tenderness, redness, or hardness. You may feel a moveable soft area under your skin. An abscess can occur anywhere in the body.  °TREATMENT  °A surgical cut (incision) may be made over your abscess to drain the pus. Gauze may be packed into the space or a drain may be looped through the abscess cavity (pocket). This provides a drain that will allow the cavity to heal from the inside outwards. The abscess may be painful for a few days, but should feel much better if it was drained.  °Your abscess, if seen early, may not have localized and may not have been drained. If not, another appointment may be required if it does not get better on its own or with medications. °HOME CARE INSTRUCTIONS  °· Only take over-the-counter or prescription medicines for pain, discomfort, or fever as directed by your caregiver.  °· Take your antibiotics as directed if they were prescribed. Finish them even if you start to feel better.  °· Keep the skin and clothes clean around your abscess.  °· If the abscess was drained, you will need to use gauze dressing to collect any draining pus. Dressings will typically need to be changed 3 or more times a day.  °· The infection may spread by skin contact with others. Avoid skin contact as much as possible.  °· Practice good hygiene. This includes regular hand washing, cover any draining skin lesions, and do not share personal care items.  °· If you participate in  sports, do not share athletic equipment, towels, whirlpools, or personal care items. Shower after every practice or tournament.  °· If a draining area cannot be adequately covered:  °· Do not participate in sports.  °· Children should not participate in day care until the wound has healed or drainage stops.  °· If your caregiver has given you a follow-up appointment, it is very important to keep that appointment. Not keeping the appointment could result in a much worse infection, chronic or permanent injury, pain, and disability. If there is any problem keeping the appointment, you must call back to this facility for assistance.  °SEEK MEDICAL CARE IF:  °· You develop increased pain, swelling, redness, drainage, or bleeding in the wound site.  °· You develop signs of generalized infection including muscle aches, chills, fever, or a general ill feeling.  °· You have an oral temperature above 102° F (38.9° C).  °MAKE SURE YOU:  °· Understand these instructions.  °· Will watch your condition.  °· Will get help right away if you are not doing well or get worse.  °Document Released: 12/15/2004 Document Revised: 11/17/2010 Document Reviewed: 10/09/2007 °[employer]® Patient Information ©2012 [employer]. ° ° °

## 2011-07-04 NOTE — ED Notes (Signed)
Patient reporting headaches intermittently x several weeks; and also reporting a large boil to right labia of vagina x several days. Patient noticed the boil about a week ago after shaving.

## 2011-07-04 NOTE — ED Notes (Signed)
Pt reports intermittant headache since beginning of pregnancy. Pt reports [redacted] weeks pregnant. Pt has had tylenol and fioracet without relief. Pt reports boil x 3 days to labia. Reports nausea, no relief with zofran. Denies any vaginal bleeding, cramps, discharge. Pt followed by health clinic for OB.

## 2011-07-04 NOTE — ED Provider Notes (Signed)
Medical screening examination/treatment/procedure(s) were performed by non-physician practitioner and as supervising physician I was immediately available for consultation/collaboration.   Benny Lennert, MD 07/04/11 [phone]

## 2011-07-05 LAB — GC/CHLAMYDIA PROBE AMP, GENITAL: Chlamydia, DNA Probe: NEGATIVE

## 2011-08-15 ENCOUNTER — Inpatient Hospital Stay (HOSPITAL_COMMUNITY)
Admission: AD | Admit: 2011-08-15 | Discharge: 2011-08-16 | Disposition: A | Discharge status: Hospice | Payer: Medicaid Other | Source: Ambulatory Visit | Attending: Obstetrics & Gynecology | Admitting: Obstetrics & Gynecology

## 2011-08-15 ENCOUNTER — Encounter (HOSPITAL_COMMUNITY): Payer: Self-pay | Admitting: *Deleted

## 2011-08-15 DIAGNOSIS — R109 Unspecified abdominal pain: Secondary | ICD-10-CM | POA: Insufficient documentation

## 2011-08-15 DIAGNOSIS — K219 Gastro-esophageal reflux disease without esophagitis: Secondary | ICD-10-CM | POA: Insufficient documentation

## 2011-08-15 DIAGNOSIS — Z331 Pregnant state, incidental: Secondary | ICD-10-CM

## 2011-08-15 DIAGNOSIS — O99891 Other specified diseases and conditions complicating pregnancy: Secondary | ICD-10-CM | POA: Insufficient documentation

## 2011-08-15 HISTORY — DX: Gastro-esophageal reflux disease without esophagitis: K21.9

## 2011-08-15 LAB — URINALYSIS, ROUTINE W REFLEX MICROSCOPIC
Ketones, ur: NEGATIVE mg/dL
Leukocytes, UA: NEGATIVE
Nitrite: NEGATIVE
Protein, ur: NEGATIVE mg/dL

## 2011-08-15 LAB — URINE MICROSCOPIC-ADD ON

## 2011-08-15 LAB — COMPREHENSIVE METABOLIC PANEL
ALT: 5 U/L (ref 0–35)
AST: 10 U/L (ref 0–37)
Alkaline Phosphatase: 62 U/L (ref 39–117)
GFR calc Af Amer: >90 mL/min (ref >90)
Glucose, Bld: 89 mg/dL (ref 70–99)
Potassium: 3.5 mEq/L (ref 3.5–5.1)
Sodium: 134 mEq/L — ABNORMAL LOW (ref 135–145)
Total Protein: 6.2 g/dL (ref 6.0–8.3)

## 2011-08-15 MED ORDER — GI COCKTAIL ~~LOC~~
30.0000 mL | Freq: Once | ORAL | Status: AC
  Administered 2011-08-15: 30 mL via ORAL
  Filled 2011-08-15: qty 30

## 2011-08-15 MED ORDER — PANTOPRAZOLE SODIUM 40 MG PO TBEC: 40.0000 mg | DELAYED_RELEASE_TABLET | Freq: Every day | ORAL | Status: DC

## 2011-08-15 MED ORDER — PANTOPRAZOLE SODIUM 40 MG PO TBEC
40.0000 mg | DELAYED_RELEASE_TABLET | Freq: Every day | ORAL | Status: DC
  Administered 2011-08-15: 40 mg via ORAL
  Filled 2011-08-15 (×2): qty 1

## 2011-08-15 MED ORDER — ACETAMINOPHEN 325 MG PO TABS
650.0000 mg | ORAL_TABLET | Freq: Once | ORAL | Status: AC
  Administered 2011-08-15: 650 mg via ORAL
  Filled 2011-08-15: qty 2

## 2011-08-15 NOTE — MAU Provider Note (Signed)
Chief Complaint:  Abdominal Pain    First Provider Initiated Contact with Patient 08/15/11 2218      Kristie Sandoval is  26 y.o. G2P0010.  Patient's last menstrual period was 02/05/2011..  [redacted]w[redacted]d She presents complaining of Abdominal Pain  Onset is described as intermittent sharp RUQ pain and has been present for  1 weeks. Worse tonight after eating a large meal. States radiated to mid back.  Obstetrical/Gynecological History: OB History    Grav Para Term Preterm Abortions TAB SAB Ect Mult Living   2 0 0 0 1 0 1 0 0 0       Past Medical History: Past Medical History  Diagnosis Date  . Trichomonas   . Headache     no hx prior to preg  . GERD (gastroesophageal reflux disease)     Past Surgical History: Past Surgical History  Procedure Date  . No past surgeries     Family History: Family History  Problem Relation Age of Onset  . Anesthesia problems Neg Hx   . Diabetes Maternal Grandmother     Social History: History  Substance Use Topics  . Smoking status: Former Smoker -- 0.2 packs/day    Types: Cigarettes    Quit date: 02/18/2011  . Smokeless tobacco: Never Used  . Alcohol Use: No    Allergies: No Known Allergies  Prescriptions prior to admission  Medication Sig Dispense Refill  . butalbital-acetaminophen-caffeine (FIORICET, ESGIC) 50-325-40 MG per tablet Take 1 tablet by mouth every 6 (six) hours as needed. For headache      . cyclobenzaprine (FLEXERIL) 10 MG tablet Take 10 mg by mouth 3 (three) times daily as needed. As needed for spasms/pain      . famotidine (PEPCID) 20 MG tablet Take 1 tablet (20 mg total) by mouth 2 (two) times daily.  30 tablet  0  . ondansetron (ZOFRAN-ODT) 8 MG disintegrating tablet Take 8 mg by mouth every 8 (eight) hours as needed. For nausea      . promethazine (PHENERGAN) 25 MG tablet Take 25 mg by mouth every 6 (six) hours as needed. For nausea        Review of Systems - Gastrointestinal ROS: positive for - RUQ pain, sharp  radiating thru to back  Physical Exam   Blood pressure 111/69, pulse 80, temperature 98.3 F (36.8 C), temperature source Oral, resp. rate 18, last menstrual period 02/05/2011.  General: General appearance - acyanotic, in no respiratory distress, anxious and crying, standing at side of bed rocking, requesting medication for pain Mental status - anxious, uncomfortable Focused Gynecological Exam: examination not indicated FHR: 140, mod variability, + accels Cat I tracing Toco: no contractions  ED Course: 12:18 AM Pt lying in bed, flat, smiling. States pain improved by still present after GI cocktail and tylenol. Will obtain labs and continue observations  Labs: Recent Results (from the past 24 hour(s))  URINALYSIS, ROUTINE W REFLEX MICROSCOPIC   Collection Time   08/15/11 10:00 PM      Component Value Range   Color, Urine YELLOW  YELLOW    APPearance CLEAR  CLEAR    Specific Gravity, Urine 1.020  1.005 - 1.030    pH 7.5  5.0 - 8.0    Glucose, UA NEGATIVE  NEGATIVE (mg/dL)   Hgb urine dipstick TRACE (*) NEGATIVE    Bilirubin Urine NEGATIVE  NEGATIVE    Ketones, ur NEGATIVE  NEGATIVE (mg/dL)   Protein, ur NEGATIVE  NEGATIVE (mg/dL)   Urobilinogen, UA 0.2  0.0 - 1.0 (mg/dL)   Nitrite NEGATIVE  NEGATIVE    Leukocytes, UA NEGATIVE  NEGATIVE   URINE MICROSCOPIC-ADD ON   Collection Time   08/15/11 10:00 PM      Component Value Range   Squamous Epithelial / LPF MANY (*) RARE    WBC, UA 3-6  <3 (WBC/hpf)   RBC / HPF 0-2  <3 (RBC/hpf)   Bacteria, UA FEW (*) RARE    Urine-Other MUCOUS PRESENT    COMPREHENSIVE METABOLIC PANEL   Collection Time   08/15/11 11:00 PM      Component Value Range   Sodium 134 (*) 135 - 145 (mEq/L)   Potassium 3.5  3.5 - 5.1 (mEq/L)   Chloride 100  96 - 112 (mEq/L)   CO2 26  19 - 32 (mEq/L)   Glucose, Bld 89  70 - 99 (mg/dL)   BUN 7  6 - 23 (mg/dL)   Creatinine, Ser 4.09  0.50 - 1.10 (mg/dL)   Calcium 9.0  8.4 - 81.1 (mg/dL)   Total Protein 6.2  6.0 -  8.3 (g/dL)   Albumin 2.7 (*) 3.5 - 5.2 (g/dL)   AST 10  0 - 37 (U/L)   ALT 5  0 - 35 (U/L)   Alkaline Phosphatase 62  39 - 117 (U/L)   Total Bilirubin 0.1 (*) 0.3 - 1.2 (mg/dL)   GFR calc non Af Amer >90  >90 (mL/min)   GFR calc Af Amer >90  >90 (mL/min)   ED Course: Pain improved after GI cocktail, tylenol and protonix. Pt requesting to go home prior to return of labwork  Assessment: GERD  Plan: Discharge home Rx Protonix sent to pharmacy FU as scheduled at [facility] [place]. 08/16/2011,12:18 AM

## 2011-08-15 NOTE — Discharge Instructions (Signed)
Heartburn During Pregnancy  Heartburn is a burning sensation in the chest caused by stomach acid backing up into the esophagus. Heartburn (also known as "reflux") is common in pregnancy because a certain hormone (progesterone) changes. The progesterone hormone may relax the valve that separates the esophagus from the stomach. This allows acid to go up into the esophagus, causing heartburn. Heartburn may also happen in pregnancy because the enlarging uterus pushes up on the stomach, which pushes more acid into the esophagus. This is especially true in the later stages of pregnancy. Heartburn problems usually go away after giving birth. CAUSES   The progesterone hormone.   Changing hormone levels.   The growing uterus that pushes stomach acid upward.   Large meals.   Certain foods and drinks.   Exercise.   Increased acid production.  SYMPTOMS   Burning pain in the chest or lower throat.   Bitter taste in the mouth.   Coughing.  DIAGNOSIS  Heartburn is typically diagnosed by your caregiver when taking a careful history of your concern. Your caregiver may order a blood test to check for a certain type of bacteria that is associated with heartburn. Sometimes, heartburn is diagnosed by prescribing a heartburn medicine to see if the symptoms improve. It is rare in pregnancy to have a procedure called an endoscopy. This is when a tube with a light and a camera on the end is used to examine the esophagus and the stomach. TREATMENT   Your caregiver may tell you to use certain over-the-counter medicines (antacids, acid reducers) for mild heartburn.   Your caregiver may prescribe medicines to decrease stomach acid or to protect your stomach lining.   Your caregiver may recommend certain diet changes.   For severe cases, your caregiver may recommend that the head of the bed be elevated on blocks. (Sleeping with more pillows is not an effective treatment as it only changes the position of your  head and does not improve the main problem of stomach acid refluxing into the esophagus.)  HOME CARE INSTRUCTIONS   Take all medicines as directed by your caregiver.   Raise the head of your bed by putting blocks under the legs if instructed to by your caregiver.   Do not exercise right after eating.   Avoid eating 2 or 3 hours before bed. Do not lie down right after eating.   Eat small meals throughout the day instead of 3 large meals.   Identify foods and beverages that make your symptoms worse and avoid them. Foods you may want to avoid include:   Peppers.   Chocolate.   High-fat foods, including fried foods.   Spicy foods.   Garlic and onions.   Citrus fruits, including oranges, grapefruit, lemons, and limes.   Food containing tomatoes or tomato products.   Mint.   Carbonated and caffeinated drinks.   Vinegar.  SEEK IMMEDIATE MEDICAL CARE IF:   You have severe chest pain that goes down your arm or into your jaw or neck.   You feel sweaty, dizzy, or lightheaded.   You become short of breath.   You vomit blood.   You have difficulty or pain with swallowing.   You have bloody or black, tarry stools.   You have episodes of heartburn more than 3 times a week, for more than 2 weeks.  MAKE SURE YOU:  Understand these instructions.   Will watch your condition.   Will get help right away if you are not doing well or   get worse.  Document Released: 03/04/2000 Document Revised: 02/24/2011 Document Reviewed: 08/26/2010 [employer] Patient Information 2012 [employer]. 

## 2011-08-15 NOTE — MAU Note (Signed)
Pt c/o upper abd pain that is worse after she eats and when she sits up straight.  Sometimes it radiates through the center of her mid-upper back.  She states this problem has become frequent over this past week, happening daily.  Denies any bleeding , leaking or discharge.

## 2011-08-15 NOTE — MAU Note (Signed)
Pt reports for the last week she has been having sharp pain in upper and lower abd that radiates to her back. Worse after eating. Denies fever.

## 2011-08-28 ENCOUNTER — Encounter (HOSPITAL_COMMUNITY): Payer: Self-pay | Admitting: Obstetrics and Gynecology

## 2011-08-28 ENCOUNTER — Inpatient Hospital Stay (HOSPITAL_COMMUNITY)
Admission: AD | Admit: 2011-08-28 | Discharge: 2011-08-29 | Disposition: A | Discharge status: Hospice | Payer: Medicaid Other | Source: Ambulatory Visit | Attending: Obstetrics & Gynecology | Admitting: Obstetrics & Gynecology

## 2011-08-28 ENCOUNTER — Inpatient Hospital Stay (HOSPITAL_COMMUNITY): Payer: Medicaid Other

## 2011-08-28 DIAGNOSIS — R112 Nausea with vomiting, unspecified: Secondary | ICD-10-CM

## 2011-08-28 DIAGNOSIS — R1012 Left upper quadrant pain: Secondary | ICD-10-CM | POA: Insufficient documentation

## 2011-08-28 DIAGNOSIS — O99891 Other specified diseases and conditions complicating pregnancy: Secondary | ICD-10-CM | POA: Insufficient documentation

## 2011-08-28 DIAGNOSIS — O479 False labor, unspecified: Secondary | ICD-10-CM

## 2011-08-28 LAB — URINALYSIS, ROUTINE W REFLEX MICROSCOPIC
Bilirubin Urine: NEGATIVE
Glucose, UA: NEGATIVE mg/dL
Ketones, ur: 15 mg/dL — AB
Leukocytes, UA: NEGATIVE
Nitrite: NEGATIVE
Protein, ur: NEGATIVE mg/dL
Specific Gravity, Urine: 1.025 (ref 1.005–1.030)
Urobilinogen, UA: 0.2 mg/dL (ref 0.0–1.0)
pH: 6 (ref 5.0–8.0)

## 2011-08-28 LAB — URINE MICROSCOPIC-ADD ON

## 2011-08-28 LAB — FETAL FIBRONECTIN: Fetal Fibronectin: NEGATIVE

## 2011-08-28 NOTE — MAU Note (Signed)
"  I've had this pain on my upper LT side of abd since before [facility].  It hurts to sit up, eat, or move.  The area is swollen.  It wakes me up out of my sleep.  I wake up throwing up.  The pain goes right through to my back.  My mom just had her gallstones removed.  When I was [place] on [facility], I couldn't get the U/S to check my gallbladder that day because I had eaten and would've had to wait for 8 hours."

## 2011-08-28 NOTE — MAU Note (Signed)
Pt reports continuous vomiting since last pm, constant pain in mid/left upper abd. Pt reports she was seen 2 weeks ago and told it may be her gallbladder, given protonix but has no improvement in pain.

## 2011-08-28 NOTE — MAU Provider Note (Signed)
History     CSN: [id number]  Arrival date and time: 08/28/11 2124   First Provider Initiated Contact with Patient 08/28/11 2211      Chief Complaint  Patient presents with  . Abdominal Pain   HPI  26 yo  G2P0010 presents with left upper quadrant pain which she states began before labor day.  It is constant, cramping, radiates to her back.  On her prior visit, she reports she had been offered a RUQ ultrasound but declined because she would have had to go 8 hours without eating and it was too long a wait.  Of note, on their note they say at that time she had RUQ pain, but she denies this and states that she in fact had LUQ pain then also.  Pain has worsened since labor day continuously.  She fasted for 8 hours prior to this visit.  No contractions, vaginal bleeding, loss of fluid.  +nausea, vomited 8 times today.  Has been vomiting throughout pregnancy.  Has not had a prior ultrasound per her.  OB History    Grav Para Term Preterm Abortions TAB SAB Ect Mult Living   2 0 0 0 1 0 1 0 0 0       Past Medical History  Diagnosis Date  . Trichomonas   . Headache     no hx prior to preg  . GERD (gastroesophageal reflux disease)     Past Surgical History  Procedure Date  . No past surgeries     Family History  Problem Relation Age of Onset  . Anesthesia problems Neg Hx   . Hypotension Neg Hx   . Malignant hyperthermia Neg Hx   . Pseudochol deficiency Neg Hx   . Diabetes Maternal Grandmother     History  Substance Use Topics  . Smoking status: Former Smoker -- 0.2 packs/day    Types: Cigarettes    Quit date: 02/18/2011  . Smokeless tobacco: Never Used  . Alcohol Use: No    Allergies: No Known Allergies  Prescriptions prior to admission  Medication Sig Dispense Refill  . cyclobenzaprine (FLEXERIL) 10 MG tablet Take 10 mg by mouth 3 (three) times daily as needed. As needed for spasms/pain      . famotidine (PEPCID) 20 MG tablet Take 1 tablet (20 mg total) by mouth 2  (two) times daily.  30 tablet  0  . ondansetron (ZOFRAN-ODT) 8 MG disintegrating tablet Take 8 mg by mouth every 8 (eight) hours as needed. For nausea      . pantoprazole (PROTONIX) 40 MG tablet Take 1 tablet (40 mg total) by mouth daily at 12 noon.  30 tablet  2  . promethazine (PHENERGAN) 25 MG tablet Take 25 mg by mouth every 6 (six) hours as needed. For nausea        ROS  ROS as per HPI.  Other pertinent ROS negative.  Physical Exam   Blood pressure 116/71, pulse 80, temperature 97 F (36.1 C), temperature source Oral, resp. rate 18, height 5' 3.5" (1.613 m), weight 74.844 kg (165 lb), last menstrual period 02/05/2011.  Physical Exam  Constitutional: She is oriented to person, place, and time. She appears well-developed and well-nourished. No distress.  HENT:  Head: Normocephalic and atraumatic.  Eyes: Conjunctivae and EOM are normal.  Neck: No tracheal deviation present.  Respiratory: No stridor.  GI: Soft. She exhibits no distension. There is no tenderness. There is no rebound and no guarding.       Gravid.  RUQ no pain or tenderness over gallbladder.  LUQ nontender, but reports pain just over or under her ribs on that side.  Neurological: She is alert and oriented to person, place, and time.  Skin: Skin is warm and dry. She is not diaphoretic.  Psychiatric: She has a normal mood and affect. Her behavior is normal. Judgment and thought content normal.   SE: Cervix normal, no abnormal discharge.  No vaginal or labial lesions.  FFN collected. On palpation, cervix is soft, anterior, 1 cm, -2 station, unable to ascertain presenting part.  Continuous electronic fetal monitoring: regular contractions every 3-4 minutes, which patient cannot feel.  Fetal tracing with moderate variability, accelerations present, no decels.  MAU Course  Procedures  RUQ ultrasound  Gallbladder: Normal sonographic appearance. No gallbladder wall  thickening. Negative sonographic Murphy's sign.    Common bile duct: Normal caliber at 2 mm.  Liver: Normal sonographic appearance. No focal abnormality.  Limited focused imaging of the left upper quadrant is  nondiagnostic.  IMPRESSION:  No cholelithiasis or sonographic evidence for cholecystitis.  FFN negative  Assessment and Plan  26 year old female with abdominal pain. Abdomen is benign RUQ normal  Contractions FFN negative 1L D5LR 10 mg q20 prn for >5 contractions per hour, max 4 doses --> contractions < 5/hr at time of d/c.   Clancy Gourd 08/28/2011, 10:45 PM

## 2011-08-29 DIAGNOSIS — R1012 Left upper quadrant pain: Secondary | ICD-10-CM

## 2011-08-29 MED ORDER — ONDANSETRON HCL 4 MG/2ML IJ SOLN
4.0000 mg | Freq: Once | INTRAMUSCULAR | Status: AC
  Administered 2011-08-29: 4 mg via INTRAVENOUS
  Filled 2011-08-29: qty 2

## 2011-08-29 MED ORDER — DEXTROSE 5 % IN LACTATED RINGERS IV BOLUS
1000.0000 mL | Freq: Once | INTRAVENOUS | Status: AC
  Administered 2011-08-29: 1000 mL via INTRAVENOUS

## 2011-08-29 MED ORDER — ONDANSETRON 8 MG PO TBDP: 8.0000 mg | ORAL_TABLET | Freq: Three times a day (TID) | ORAL | Status: DC | PRN

## 2011-08-29 MED ORDER — ACETAMINOPHEN 325 MG PO TABS
650.0000 mg | ORAL_TABLET | Freq: Once | ORAL | Status: AC
  Administered 2011-08-29: 650 mg via ORAL
  Filled 2011-08-29: qty 2

## 2011-08-29 MED ORDER — GI COCKTAIL ~~LOC~~
30.0000 mL | Freq: Once | ORAL | Status: AC
  Administered 2011-08-29: 30 mL via ORAL
  Filled 2011-08-29: qty 30

## 2011-08-29 MED ORDER — FAMOTIDINE-CA CARB-MAG HYDROX 10-800-165 MG PO CHEW: 2.0000 | CHEWABLE_TABLET | Freq: Two times a day (BID) | ORAL | Status: DC | PRN

## 2011-08-29 MED ORDER — NIFEDIPINE 10 MG PO CAPS
10.0000 mg | ORAL_CAPSULE | ORAL | Status: DC | PRN
  Administered 2011-08-29: 10 mg via ORAL
  Filled 2011-08-29: qty 1

## 2011-08-29 NOTE — Discharge Instructions (Signed)
Nausea and Vomiting  Nausea is a sick feeling that often comes before throwing up (vomiting). Vomiting is a reflex where stomach contents come out of your mouth. Vomiting can cause severe loss of body fluids (dehydration). Children and elderly adults can become dehydrated quickly, especially if they also have diarrhea. Nausea and vomiting are symptoms of a condition or disease. It is important to find the cause of your symptoms.  CAUSES    Direct irritation of the stomach lining. This irritation can result from increased acid production (gastroesophageal reflux disease), infection, food poisoning, taking certain medicines (such as nonsteroidal anti-inflammatory drugs), alcohol use, or tobacco use.   Signals from the brain.These signals could be caused by a headache, heat exposure, an inner ear disturbance, increased pressure in the brain from injury, infection, a tumor, or a concussion, pain, emotional stimulus, or metabolic problems.   An obstruction in the gastrointestinal tract (bowel obstruction).   Illnesses such as diabetes, hepatitis, gallbladder problems, appendicitis, kidney problems, cancer, sepsis, atypical symptoms of a heart attack, or eating disorders.   Medical treatments such as chemotherapy and radiation.   Receiving medicine that makes you sleep (general anesthetic) during surgery.  DIAGNOSIS  Your caregiver may ask for tests to be done if the problems do not improve after a few days. Tests may also be done if symptoms are severe or if the reason for the nausea and vomiting is not clear. Tests may include:   Urine tests.   Blood tests.   Stool tests.   Cultures (to look for evidence of infection).   X-rays or other imaging studies.  Test results can help your caregiver make decisions about treatment or the need for additional tests.  TREATMENT  You need to stay well hydrated. Drink frequently but in small amounts.You may wish to drink water, sports drinks, clear broth, or eat frozen  ice pops or gelatin dessert to help stay hydrated.When you eat, eating slowly may help prevent nausea.There are also some antinausea medicines that may help prevent nausea.  HOME CARE INSTRUCTIONS    Take all medicine as directed by your caregiver.   If you do not have an appetite, do not force yourself to eat. However, you must continue to drink fluids.   If you have an appetite, eat a normal diet unless your caregiver tells you differently.   Eat a variety of complex carbohydrates (rice, wheat, potatoes, bread), lean meats, yogurt, fruits, and vegetables.   Avoid high-fat foods because they are more difficult to digest.   Drink enough water and fluids to keep your urine clear or pale yellow.   If you are dehydrated, ask your caregiver for specific rehydration instructions. Signs of dehydration may include:   Severe thirst.   Dry lips and mouth.   Dizziness.   Dark urine.   Decreasing urine frequency and amount.   Confusion.   Rapid breathing or pulse.  SEEK IMMEDIATE MEDICAL CARE IF:    You have blood or brown flecks (like coffee grounds) in your vomit.   You have black or bloody stools.   You have a severe headache or stiff neck.   You are confused.   You have severe abdominal pain.   You have chest pain or trouble breathing.   You do not urinate at least once every 8 hours.   You develop cold or clammy skin.   You continue to vomit for longer than 24 to 48 hours.   You have a fever.  MAKE SURE YOU:      Understand these instructions.   Will watch your condition.   Will get help right away if you are not doing well or get worse.  Document Released: 03/07/2005 Document Revised: 02/24/2011 Document Reviewed: 08/04/2010  [employer] Patient Information 2012 [employer].

## 2011-08-29 NOTE — MAU Provider Note (Signed)
I was present for the exam and agree with above.  Dorathy Kinsman, CNM 08/29/2011 2:30 AM

## 2011-08-30 ENCOUNTER — Emergency Department (HOSPITAL_COMMUNITY)
Admission: EM | Admit: 2011-08-30 | Discharge: 2011-08-30 | Disposition: A | Discharge status: Home / Self Care | Payer: Medicaid Other | Attending: Emergency Medicine | Admitting: Emergency Medicine

## 2011-08-30 ENCOUNTER — Encounter (HOSPITAL_COMMUNITY): Payer: Self-pay | Admitting: *Deleted

## 2011-08-30 DIAGNOSIS — IMO0002 Reserved for concepts with insufficient information to code with codable children: Secondary | ICD-10-CM | POA: Insufficient documentation

## 2011-08-30 DIAGNOSIS — T169XXA Foreign body in ear, unspecified ear, initial encounter: Secondary | ICD-10-CM | POA: Insufficient documentation

## 2011-08-30 DIAGNOSIS — K219 Gastro-esophageal reflux disease without esophagitis: Secondary | ICD-10-CM | POA: Insufficient documentation

## 2011-08-30 DIAGNOSIS — Z87891 Personal history of nicotine dependence: Secondary | ICD-10-CM | POA: Insufficient documentation

## 2011-08-30 NOTE — Discharge Instructions (Signed)
Ear Foreign Body  An ear foreign body is an object that is stuck in the ear. It is common for young children to put objects into the ear canal. These may include pebbles, beads, beans, and any other small objects which will fit. In adults, objects such as cotton swabs may become lodged in the ear canal. In all ages, the most common foreign bodies are insects that enter the ear canal.   SYMPTOMS   Foreign bodies may cause pain, buzzing or roaring sounds, hearing loss, and ear drainage.   HOME CARE INSTRUCTIONS    Keep all follow-up appointments with your caregiver as told.   Keep small objects out of reach of young children. Tell them not to put anything in their ears.  SEEK IMMEDIATE MEDICAL CARE IF:    You have bleeding from the ear.   You have increased pain or swelling of the ear.   You have reduced hearing.   You have discharge coming from the ear.   You have a fever.   You have a headache.  MAKE SURE YOU:    Understand these instructions.   Will watch your condition.   Will get help right away if you are not doing well or get worse.  Document Released: 03/04/2000 Document Revised: 02/24/2011 Document Reviewed: 10/24/2007  [employer] Patient Information 2012 [employer].

## 2011-08-30 NOTE — ED Notes (Signed)
Pt has earring back stuck in left ear

## 2011-08-30 NOTE — ED Provider Notes (Signed)
History     CSN: [id number]  Arrival date & time 08/30/11  0503   First MD Initiated Contact with Patient 08/30/11 [phone]      Chief Complaint  Patient presents with  . Foreign Body    (Consider location/radiation/quality/duration/timing/severity/associated sxs/prior treatment) HPI Pt reports a short while ago, a small piece of plastic from one of her ear rings fell into her auditory canal. She was unable to get it out at home. Denies any pain, no drainage. Hearing is normal.   Past Medical History  Diagnosis Date  . Trichomonas   . Headache     no hx prior to preg  . GERD (gastroesophageal reflux disease)     Past Surgical History  Procedure Date  . No past surgeries     Family History  Problem Relation Age of Onset  . Anesthesia problems Neg Hx   . Hypotension Neg Hx   . Malignant hyperthermia Neg Hx   . Pseudochol deficiency Neg Hx   . Diabetes Maternal Grandmother     History  Substance Use Topics  . Smoking status: Former Smoker -- 0.2 packs/day    Types: Cigarettes    Quit date: 02/18/2011  . Smokeless tobacco: Never Used  . Alcohol Use: No    OB History    Grav Para Term Preterm Abortions TAB SAB Ect Mult Living   2 0 0 0 1 0 1 0 0 0       Review of Systems  Constitutional: Negative for fever.  HENT: Negative for ear pain.     Allergies  Review of patient's allergies indicates no known allergies.  Home Medications   Current Outpatient Rx  Name Route Sig Dispense Refill  . CYCLOBENZAPRINE HCL 10 MG PO TABS Oral Take 10 mg by mouth 3 (three) times daily as needed. As needed for spasms/pain    . FAMOTIDINE 20 MG PO TABS Oral Take 1 tablet (20 mg total) by mouth 2 (two) times daily. 30 tablet 0  . ONDANSETRON 8 MG PO TBDP Oral Take 1 tablet (8 mg total) by mouth every 8 (eight) hours as needed. For nausea 20 tablet 2  . PANTOPRAZOLE SODIUM 40 MG PO TBEC Oral Take 1 tablet (40 mg total) by mouth daily at 12 noon. 30 tablet 2    BP 111/70  Pulse  70  Temp(Src) 98.1 F (36.7 C) (Oral)  Resp 18  SpO2 100%  LMP 02/05/2011  Physical Exam  Constitutional: She is oriented to person, place, and time. She appears well-developed and well-nourished.  HENT:  Head: Normocephalic and atraumatic.       Small piece of plastic visible in L auditory canal.   Neck: Neck supple.  Pulmonary/Chest: Effort normal.  Neurological: She is alert and oriented to person, place, and time. No cranial nerve deficit.  Psychiatric: She has a normal mood and affect. Her behavior is normal.    ED Course  Procedures (including critical care time)  Labs Reviewed - No data to display    No diagnosis found.    MDM  Ear foreign body removed with saline irrigation. No residual foreign body, TM is intact.        Dora Clauss B. Bernette Mayers, MD 08/30/11 [phone]

## 2011-08-30 NOTE — ED Notes (Signed)
Patient given discharge instructions, information, prescriptions, and diet order. Patient states that they adequately understand discharge information given and to return to ED if symptoms return or worsen.     

## 2011-10-06 LAB — OB RESULTS CONSOLE GBS: GBS: POSITIVE

## 2011-10-12 ENCOUNTER — Inpatient Hospital Stay (HOSPITAL_COMMUNITY)
Admission: AD | Admit: 2011-10-12 | Discharge: 2011-10-12 | Disposition: A | Discharge status: Home / Self Care | Payer: Medicaid Other | Source: Ambulatory Visit | Attending: Obstetrics & Gynecology | Admitting: Obstetrics & Gynecology

## 2011-10-12 ENCOUNTER — Encounter (HOSPITAL_COMMUNITY): Payer: Self-pay

## 2011-10-12 DIAGNOSIS — O210 Mild hyperemesis gravidarum: Secondary | ICD-10-CM

## 2011-10-12 DIAGNOSIS — O212 Late vomiting of pregnancy: Secondary | ICD-10-CM | POA: Insufficient documentation

## 2011-10-12 LAB — URINALYSIS, ROUTINE W REFLEX MICROSCOPIC
Bilirubin Urine: NEGATIVE
Hgb urine dipstick: NEGATIVE
Ketones, ur: NEGATIVE mg/dL
Nitrite: NEGATIVE
Urobilinogen, UA: 0.2 mg/dL (ref 0.0–1.0)

## 2011-10-12 MED ORDER — ONDANSETRON 8 MG/NS 50 ML IVPB
8.0000 mg | Freq: Once | INTRAVENOUS | Status: AC
  Administered 2011-10-12: 8 mg via INTRAVENOUS
  Filled 2011-10-12: qty 8

## 2011-10-12 MED ORDER — SODIUM CHLORIDE 0.9 % IV BOLUS (SEPSIS): 1000.0000 mL | Freq: Once | INTRAVENOUS | Status: DC

## 2011-10-12 MED ORDER — ONDANSETRON HCL 4 MG PO TABS: 8.0000 mg | ORAL_TABLET | Freq: Once | ORAL | Status: DC

## 2011-10-12 NOTE — MAU Note (Signed)
Pt states has had n/v x2days. Last took zofran today with no help. Last ate fruit at 1600, drank water at same time. Did note ctx's yesterday and today. Denies bleeding or lof.

## 2011-10-12 NOTE — MAU Provider Note (Signed)
History   CSN: [id number]  Arrival date and time: 10/12/11 1815   First Provider Initiated Contact with Patient 10/12/11 1931      Chief Complaint  Patient presents with  . Nausea  . Emesis   HPI: Kristie Sandoval is a G2P0010 at 37 weeks + 0 days who [facility].  She has had problems with what sounds like hyperemesis gravidarum throughout the pregnancy, with nausea and repeated vomiting almost every morning for the first trimester, every few days for the second trimester, and every few weeks now.  She sips fluids throughout the day to stay hydrated.  This morning, she vomited 5 times (mostly bile) and couldn't keep her lunch down this afternoon.  Then she felt periodic abdominal tightening around 1400, which she thought were contractions, so she came in for evaluation.  She denies LOF, vaginal bleeding and reports good fetal movement.  She says that other than the vomiting and contractions, she feels fine - no malaise, fever, abdominal pain, back pain, flank pain, hematuria, or prandial discomfort.  Currently, she feels well and cannot feel her contractions at all.  She really would like to go home if she is not in labor.  OB History    Grav Para Term Preterm Abortions TAB SAB Ect Mult Living   2 0 0 0 1 0 1 0 0 0       Past Medical History  Diagnosis Date  . Trichomonas   . Headache     no hx prior to preg  . GERD (gastroesophageal reflux disease)     Past Surgical History  Procedure Date  . No past surgeries     Family History  Problem Relation Age of Onset  . Diabetes Maternal Grandmother     History  Substance Use Topics  . Smoking status: Former Smoker -- 0.2 packs/day    Types: Cigarettes    Quit date: 02/18/2011  . Smokeless tobacco: Never Used  . Alcohol Use: No    Allergies: No Known Allergies  Prescriptions prior to admission  Medication Sig Dispense Refill  . acetaminophen (TYLENOL) 500 MG tablet  Take 500 mg by mouth every 6 (six) hours as needed. Back and stomach pains      . cyclobenzaprine (FLEXERIL) 10 MG tablet Take 10 mg by mouth 3 (three) times daily as needed. As needed for spasms/pain      . famotidine (PEPCID) 20 MG tablet Take 1 tablet (20 mg total) by mouth 2 (two) times daily.  30 tablet  0  . ondansetron (ZOFRAN-ODT) 8 MG disintegrating tablet Take 1 tablet (8 mg total) by mouth every 8 (eight) hours as needed. For nausea  20 tablet  2  . pantoprazole (PROTONIX) 40 MG tablet Take 1 tablet (40 mg total) by mouth daily at 12 noon.  30 tablet  2    Review of Systems  Constitutional: Negative for fever and chills.  Eyes: Negative for blurred vision.  Respiratory: Negative for cough and shortness of breath.   Cardiovascular: Negative for chest pain.  Gastrointestinal: Positive for heartburn, nausea and vomiting. Negative for abdominal pain (no pain besides contractions), diarrhea, constipation and blood in stool.  Genitourinary: Negative for dysuria, urgency and hematuria.  Musculoskeletal: Negative for back pain.  Skin: Negative for itching and rash.  Neurological: Negative for dizziness and headaches.   Physical Exam   Temp:  [98.5 F (36.9 C)] 98.5 F (36.9 C) (07/24 1829) Pulse Rate:  [  67-69] 67  (07/24 2008) Resp:  [16-18] 18  (07/24 2008) BP: (110-113)/(66-67) 110/67 mmHg (07/24 2008) Weight:  [76.658 kg (169 lb)] 76.658 kg (169 lb) (07/24 1829)   Physical Exam  Constitutional: She is oriented to person, place, and time. She appears well-developed and well-nourished. No distress.  HENT:  Head: Normocephalic and atraumatic.  Eyes: Conjunctivae are normal.  Neck: Normal range of motion. Neck supple.  Cardiovascular: Normal rate, regular rhythm and normal heart sounds.   Respiratory: Effort normal and breath sounds normal.  GI: Soft. Bowel sounds are normal. She exhibits no distension. There is no tenderness. There is no rigidity, no rebound, no guarding and  negative Murphy's sign.  Genitourinary: There is no rash, tenderness or lesion on the right labia. There is no rash, tenderness or lesion on the left labia. Cervix exhibits no motion tenderness, no discharge and no friability. No erythema, tenderness or bleeding around the vagina. No vaginal discharge found.       Cervix was 1.5 cm dilated, very thick (maybe 10% effaced), and fairly posterior  Neurological: She is alert and oriented to person, place, and time.  Skin: Skin is warm and dry. She is not diaphoretic.  Psychiatric: She has a normal mood and affect.    MAU Course  Procedures  Pt given one dose of IV Zofran Assessment and Plan  G2P0010 at 37.0 with vomiting: No sign or symptom of systemic illness or infection.  Unlikely to be viral gastroenteritis, pancreatitis, or hepatitis based on her clinical presentation.  No evidence of active labor since no cervical diation after contracting for 5+ hrs.  She was not interested in IVF or a repeat cervical exam, and I think that discharge home with labor precautions and plans for oral rehydration.  Advised her to [facility].  Case discussed with Sharen Counter, CNM  Mora Bellman 10/12/2011, 7:43 PM   Medication List  As of 10/20/2011  3:26 AM   TAKE these medications         acetaminophen 500 MG tablet   Commonly known as: TYLENOL   Take 500 mg by mouth every 6 (six) hours as needed. Back and stomach pains      cyclobenzaprine 10 MG tablet   Commonly known as: FLEXERIL   Take 10 mg by mouth 3 (three) times daily as needed. As needed for spasms/pain      famotidine 20 MG tablet   Commonly known as: PEPCID   Take 1 tablet (20 mg total) by mouth 2 (two) times daily.      ondansetron 8 MG disintegrating tablet   Commonly known as: ZOFRAN-ODT   Take 1 tablet (8 mg total) by mouth every 8 (eight) hours as needed. For nausea      pantoprazole 40 MG tablet   Commonly known as: PROTONIX   Take 1 tablet  (40 mg total) by mouth daily at 12 noon.           I have seen this patient and agree with the above resident's note.  LEFTWICH-KIRBY, Yeriel Mineo Certified Nurse-Midwife

## 2011-10-20 NOTE — MAU Provider Note (Signed)
Attestation of Attending Supervision of Advanced Practitioner (CNM/NP): Evaluation and management procedures were performed by the Advanced Practitioner under my supervision and collaboration.  I have reviewed the Advanced Practitioner's note and chart, and I agree with the management and plan.  Emilina Smarr, M.D.  

## 2011-10-21 ENCOUNTER — Encounter (HOSPITAL_COMMUNITY): Payer: Self-pay | Admitting: *Deleted

## 2011-10-21 ENCOUNTER — Inpatient Hospital Stay (HOSPITAL_COMMUNITY)
Admission: AD | Admit: 2011-10-21 | Discharge: 2011-10-21 | Disposition: A | Discharge status: Home / Self Care | Payer: Medicaid Other | Source: Ambulatory Visit | Attending: Obstetrics and Gynecology | Admitting: Obstetrics and Gynecology

## 2011-10-21 DIAGNOSIS — O212 Late vomiting of pregnancy: Secondary | ICD-10-CM | POA: Insufficient documentation

## 2011-10-21 DIAGNOSIS — R1013 Epigastric pain: Secondary | ICD-10-CM | POA: Insufficient documentation

## 2011-10-21 DIAGNOSIS — O479 False labor, unspecified: Secondary | ICD-10-CM

## 2011-10-21 DIAGNOSIS — O99891 Other specified diseases and conditions complicating pregnancy: Secondary | ICD-10-CM | POA: Insufficient documentation

## 2011-10-21 DIAGNOSIS — O219 Vomiting of pregnancy, unspecified: Secondary | ICD-10-CM

## 2011-10-21 DIAGNOSIS — K219 Gastro-esophageal reflux disease without esophagitis: Secondary | ICD-10-CM

## 2011-10-21 HISTORY — DX: Gonorrhea complicating pregnancy, unspecified trimester: O98.219

## 2011-10-21 HISTORY — DX: Other maternal infectious and parasitic diseases complicating pregnancy, unspecified trimester: O98.819

## 2011-10-21 HISTORY — DX: Chlamydial infection, unspecified: A74.9

## 2011-10-21 LAB — URINALYSIS, ROUTINE W REFLEX MICROSCOPIC
Bilirubin Urine: NEGATIVE
Glucose, UA: NEGATIVE mg/dL
Hgb urine dipstick: NEGATIVE
Protein, ur: NEGATIVE mg/dL
Urobilinogen, UA: 0.2 mg/dL (ref 0.0–1.0)

## 2011-10-21 LAB — WET PREP, GENITAL
Clue Cells Wet Prep HPF POC: NONE SEEN
Trich, Wet Prep: NONE SEEN

## 2011-10-21 MED ORDER — FAMOTIDINE 20 MG PO TABS
20.0000 mg | ORAL_TABLET | Freq: Once | ORAL | Status: AC
  Administered 2011-10-21: 20 mg via ORAL
  Filled 2011-10-21: qty 1

## 2011-10-21 MED ORDER — ONDANSETRON 4 MG PO TBDP
4.0000 mg | ORAL_TABLET | Freq: Once | ORAL | Status: AC
  Administered 2011-10-21: 4 mg via ORAL
  Filled 2011-10-21: qty 1

## 2011-10-21 MED ORDER — ONDANSETRON HCL 4 MG PO TABS: 4.0000 mg | ORAL_TABLET | Freq: Three times a day (TID) | ORAL | Status: AC | PRN

## 2011-10-21 MED ORDER — PANTOPRAZOLE SODIUM 20 MG PO TBEC: 20.0000 mg | DELAYED_RELEASE_TABLET | Freq: Every day | ORAL | Status: DC

## 2011-10-21 MED ORDER — FAMOTIDINE 20 MG PO TABS: 20.0000 mg | ORAL_TABLET | Freq: Two times a day (BID) | ORAL | Status: DC

## 2011-10-21 NOTE — MAU Provider Note (Signed)
History     CSN: [id number]  Arrival date and time: 10/21/11 1642   None     Chief Complaint  Patient presents with  . Labor Eval  . Emesis   HPI Pt is a 26 yo G2P0010 at [redacted]w[redacted]d presenting with abdominal cramping, nausea and vomiting for 1 day. Upper abdominal cramping and vomiting started yesterday. Today she is also having lower abdominal cramping and low back pain starting today.  She has abdominal tightening frequently that causes her to stop walking but she is able to breathe and talk through them. She is not sure how often or how long they last. No vaginal bleeding. No loss of fluid. Baby is moving normally. [facility], was supposed to have appt last week but came to MAU instead for abdominal pain and vomiting. She has been seen 15 times in ED/MAU for vomiting and reflux during this pregnancy. She ran out of zofran, protonix and pepcid.  Pt also c/o difficulty voiding. States she feels urge to go but has trouble starting stream. No dysuria or vaginal discharge.    OB History    Grav Para Term Preterm Abortions TAB SAB Ect Mult Living   2 0 0 0 1 0 1 0 0 0      No complications this pregnancy.  Past Medical History  Diagnosis Date  . Trichomonas   . Headache     no hx prior to preg  . GERD (gastroesophageal reflux disease)   . Gonorrhea, current pregnancy 12/12    Test of cure negative in April  . Chlamydia infection, current pregnancy 12/12    Test of cure negative in April    Past Surgical History  Procedure Date  . No past surgeries     Family History  Problem Relation Age of Onset  . Anesthesia problems Neg Hx   . Hypotension Neg Hx   . Malignant hyperthermia Neg Hx   . Pseudochol deficiency Neg Hx   . Other Neg Hx   . Diabetes Maternal Grandmother     History  Substance Use Topics  . Smoking status: Former Smoker -- 0.0 packs/day    Types: Cigarettes    Quit date: 02/18/2011  . Smokeless tobacco: Never Used  . Alcohol Use:  No    Allergies: No Known Allergies  Prescriptions prior to admission  Medication Sig Dispense Refill  . famotidine (PEPCID) 20 MG tablet Take 1 tablet (20 mg total) by mouth 2 (two) times daily.  30 tablet  0  . ondansetron (ZOFRAN) 4 MG tablet Take 4 mg by mouth every 8 (eight) hours as needed.      . Prenatal Vit-Fe Fumarate-FA (PRENATAL MULTIVITAMIN) TABS Take 1 tablet by mouth daily.      . pantoprazole (PROTONIX) 20 MG tablet Take 20 mg by mouth daily.        Review of Systems  Constitutional: Negative for fever and chills.  Gastrointestinal: Positive for heartburn, nausea, vomiting and abdominal pain.  Genitourinary: Positive for urgency. Negative for dysuria.  Musculoskeletal: Positive for back pain.  Neurological: Positive for headaches.  All other systems reviewed and are negative.   Physical Exam   Blood pressure 105/77, pulse 77, temperature 98.4 F (36.9 C), temperature source Oral, resp. rate 20, height 5\' 3"  (1.6 m), weight 80.015 kg (176 lb 6.4 oz), last menstrual period 02/05/2011.  Physical Exam  Constitutional: She is oriented to person, place, and time. She appears well-developed and well-nourished. No distress.  HENT:  Head: Normocephalic.  Cardiovascular: Normal rate and regular rhythm.   Respiratory: Effort normal.  GI: Soft. There is tenderness (epigastric, LUQ, suprapubic). There is no rebound and no guarding.  Genitourinary: Cervix exhibits no motion tenderness and no friability. There is tenderness around the vagina. Vaginal discharge (thick, white) found.       Sterile speculum exam performed.  Vaginal tenderness, copious thick white discharge.   Cervix 1.5/30/-3 very posterior.  Neurological: She is alert and oriented to person, place, and time.  Skin: Skin is warm and dry.   FHT: 130s, mod variability, accels, no decels. Category I. CTX: uterine irritability  MAU Course  Procedures    Assessment and Plan  26 y.o. G2P0010 at [redacted]w[redacted]d with  vomiting, abdominal pain.  1.  Pregnancy:  Not in labor. FHTs reassuring, reactive NST.  Labor precautions discussed. 2.  Epigastric pain/vomiting:  Zofran and pepcid given in MAU, pt improved. Likely GERD. Will send home with zofran, protonix, pepcid. 3.  Suprapubic pain/cramping:  Sterile spec performed, GC/Chlamydia collected and pending. Wet Prep done, negative. UA sent, negative.   Results for orders placed during the hospital encounter of 10/21/11 (from the past 24 hour(s))  URINALYSIS, ROUTINE W REFLEX MICROSCOPIC     Status: Normal   Collection Time   10/21/11  5:15 PM      Component Value Range   Color, Urine YELLOW  YELLOW   APPearance CLEAR  CLEAR   Specific Gravity, Urine 1.015  1.005 - 1.030   pH 6.5  5.0 - 8.0   Glucose, UA NEGATIVE  NEGATIVE mg/dL   Hgb urine dipstick NEGATIVE  NEGATIVE   Bilirubin Urine NEGATIVE  NEGATIVE   Ketones, ur NEGATIVE  NEGATIVE mg/dL   Protein, ur NEGATIVE  NEGATIVE mg/dL   Urobilinogen, UA 0.2  0.0 - 1.0 mg/dL   Nitrite NEGATIVE  NEGATIVE   Leukocytes, UA NEGATIVE  NEGATIVE  WET PREP, GENITAL     Status: Abnormal   Collection Time   10/21/11  6:45 PM      Component Value Range   Yeast Wet Prep HPF POC NONE SEEN  NONE SEEN   Trich, Wet Prep NONE SEEN  NONE SEEN   Clue Cells Wet Prep HPF POC NONE SEEN  NONE SEEN   WBC, Wet Prep HPF POC FEW (*) NONE SEEN    Napoleon Form 10/21/2011, 6:40 PM

## 2011-10-21 NOTE — MAU Note (Signed)
Patient states she is having contractions every 2-3 minutes with nausea and vomiting. Patient denies any bleeding or leaking and reports good fetal movement.

## 2011-10-22 LAB — GC/CHLAMYDIA PROBE AMP, GENITAL
Chlamydia, DNA Probe: NEGATIVE
GC Probe Amp, Genital: NEGATIVE

## 2011-10-26 ENCOUNTER — Inpatient Hospital Stay (HOSPITAL_COMMUNITY)
Admission: AD | Admit: 2011-10-26 | Discharge: 2011-10-28 | DRG: 775 | Disposition: A | Discharge status: Home / Self Care | Payer: Medicaid Other | Source: Ambulatory Visit | Attending: Obstetrics and Gynecology | Admitting: Obstetrics and Gynecology

## 2011-10-26 ENCOUNTER — Inpatient Hospital Stay (HOSPITAL_COMMUNITY): Payer: Medicaid Other | Admitting: Anesthesiology

## 2011-10-26 ENCOUNTER — Encounter (HOSPITAL_COMMUNITY): Payer: Self-pay | Admitting: Anesthesiology

## 2011-10-26 ENCOUNTER — Encounter (HOSPITAL_COMMUNITY): Payer: Self-pay

## 2011-10-26 DIAGNOSIS — IMO0001 Reserved for inherently not codable concepts without codable children: Secondary | ICD-10-CM

## 2011-10-26 DIAGNOSIS — Z22330 Carrier of Group B streptococcus: Secondary | ICD-10-CM

## 2011-10-26 DIAGNOSIS — O99892 Other specified diseases and conditions complicating childbirth: Principal | ICD-10-CM | POA: Diagnosis present

## 2011-10-26 DIAGNOSIS — O9989 Other specified diseases and conditions complicating pregnancy, childbirth and the puerperium: Secondary | ICD-10-CM

## 2011-10-26 LAB — CBC
HCT: 30.9 % — ABNORMAL LOW (ref 36.0–46.0)
Hemoglobin: 10.2 g/dL — ABNORMAL LOW (ref 12.0–15.0)
MCH: 28.6 pg (ref 26.0–34.0)
MCHC: 33 g/dL (ref 30.0–36.0)
RBC: 3.57 MIL/uL — ABNORMAL LOW (ref 3.87–5.11)

## 2011-10-26 MED ORDER — DIBUCAINE 1 % RE OINT
1.0000 "application " | TOPICAL_OINTMENT | RECTAL | Status: DC | PRN
  Administered 2011-10-26: 1 via RECTAL
  Filled 2011-10-26: qty 28

## 2011-10-26 MED ORDER — EPHEDRINE 5 MG/ML INJ: 10.0000 mg | INTRAVENOUS | Status: DC | PRN

## 2011-10-26 MED ORDER — ZOLPIDEM TARTRATE 5 MG PO TABS: 5.0000 mg | ORAL_TABLET | Freq: Every evening | ORAL | Status: DC | PRN

## 2011-10-26 MED ORDER — PRENATAL MULTIVITAMIN CH
1.0000 | ORAL_TABLET | Freq: Every day | ORAL | Status: DC
  Administered 2011-10-27 – 2011-10-28 (×2): 1 via ORAL
  Filled 2011-10-26 (×2): qty 1

## 2011-10-26 MED ORDER — DIPHENHYDRAMINE HCL 50 MG/ML IJ SOLN: 12.5000 mg | INTRAMUSCULAR | Status: DC | PRN

## 2011-10-26 MED ORDER — SIMETHICONE 80 MG PO CHEW: 80.0000 mg | CHEWABLE_TABLET | ORAL | Status: DC | PRN

## 2011-10-26 MED ORDER — LANOLIN HYDROUS EX OINT: TOPICAL_OINTMENT | CUTANEOUS | Status: DC | PRN

## 2011-10-26 MED ORDER — LIDOCAINE HCL (PF) 1 % IJ SOLN
30.0000 mL | INTRAMUSCULAR | Status: DC | PRN
  Filled 2011-10-26: qty 30

## 2011-10-26 MED ORDER — FENTANYL 2.5 MCG/ML BUPIVACAINE 1/10 % EPIDURAL INFUSION (WH - ANES)
INTRAMUSCULAR | Status: DC | PRN
  Administered 2011-10-26: 12 mL/h via EPIDURAL

## 2011-10-26 MED ORDER — LACTATED RINGERS IV SOLN
500.0000 mL | Freq: Once | INTRAVENOUS | Status: AC
  Administered 2011-10-26: 500 mL via INTRAVENOUS

## 2011-10-26 MED ORDER — EPHEDRINE 5 MG/ML INJ
INTRAVENOUS | Status: AC
  Filled 2011-10-26: qty 4

## 2011-10-26 MED ORDER — IBUPROFEN 600 MG PO TABS
600.0000 mg | ORAL_TABLET | Freq: Four times a day (QID) | ORAL | Status: DC | PRN
Start: 2011-10-26 — End: 2011-10-26

## 2011-10-26 MED ORDER — ACETAMINOPHEN 325 MG PO TABS: 650.0000 mg | ORAL_TABLET | ORAL | Status: DC | PRN

## 2011-10-26 MED ORDER — BENZOCAINE-MENTHOL 20-0.5 % EX AERO
1.0000 "application " | INHALATION_SPRAY | CUTANEOUS | Status: DC | PRN
  Administered 2011-10-26: 1 via TOPICAL
  Filled 2011-10-26: qty 56

## 2011-10-26 MED ORDER — LACTATED RINGERS IV SOLN: 500.0000 mL | Freq: Once | INTRAVENOUS | Status: DC

## 2011-10-26 MED ORDER — LIDOCAINE HCL (PF) 1 % IJ SOLN
INTRAMUSCULAR | Status: DC | PRN
  Administered 2011-10-26 (×2): 8 mL

## 2011-10-26 MED ORDER — FLEET ENEMA 7-19 GM/118ML RE ENEM: 1.0000 | ENEMA | RECTAL | Status: DC | PRN

## 2011-10-26 MED ORDER — IBUPROFEN 600 MG PO TABS
600.0000 mg | ORAL_TABLET | Freq: Four times a day (QID) | ORAL | Status: DC
  Administered 2011-10-26 – 2011-10-28 (×6): 600 mg via ORAL
  Filled 2011-10-26 (×6): qty 1

## 2011-10-26 MED ORDER — DIPHENHYDRAMINE HCL 50 MG/ML IJ SOLN
12.5000 mg | INTRAMUSCULAR | Status: DC | PRN
Start: 2011-10-26 — End: 2011-10-26

## 2011-10-26 MED ORDER — CITRIC ACID-SODIUM CITRATE 334-500 MG/5ML PO SOLN: 30.0000 mL | ORAL | Status: DC | PRN

## 2011-10-26 MED ORDER — ONDANSETRON HCL 4 MG PO TABS
4.0000 mg | ORAL_TABLET | ORAL | Status: DC | PRN
  Administered 2011-10-26: 4 mg via ORAL
  Filled 2011-10-26: qty 1

## 2011-10-26 MED ORDER — DIPHENHYDRAMINE HCL 25 MG PO CAPS: 25.0000 mg | ORAL_CAPSULE | Freq: Four times a day (QID) | ORAL | Status: DC | PRN

## 2011-10-26 MED ORDER — TETANUS-DIPHTH-ACELL PERTUSSIS 5-2.5-18.5 LF-MCG/0.5 IM SUSP
0.5000 mL | Freq: Once | INTRAMUSCULAR | Status: AC
  Administered 2011-10-27: 0.5 mL via INTRAMUSCULAR
  Filled 2011-10-26: qty 0.5

## 2011-10-26 MED ORDER — OXYTOCIN BOLUS FROM INFUSION
250.0000 mL | Freq: Once | INTRAVENOUS | Status: DC
  Filled 2011-10-26: qty 500

## 2011-10-26 MED ORDER — PHENYLEPHRINE 40 MCG/ML (10ML) SYRINGE FOR IV PUSH (FOR BLOOD PRESSURE SUPPORT): 80.0000 ug | PREFILLED_SYRINGE | INTRAVENOUS | Status: DC | PRN

## 2011-10-26 MED ORDER — METHYLERGONOVINE MALEATE 0.2 MG/ML IJ SOLN: 0.2000 mg | INTRAMUSCULAR | Status: DC | PRN

## 2011-10-26 MED ORDER — FENTANYL 2.5 MCG/ML BUPIVACAINE 1/10 % EPIDURAL INFUSION (WH - ANES)
14.0000 mL/h | INTRAMUSCULAR | Status: DC
  Administered 2011-10-26: 14 mL/h via EPIDURAL
  Filled 2011-10-26: qty 60

## 2011-10-26 MED ORDER — PHENYLEPHRINE 40 MCG/ML (10ML) SYRINGE FOR IV PUSH (FOR BLOOD PRESSURE SUPPORT)
PREFILLED_SYRINGE | INTRAVENOUS | Status: AC
  Filled 2011-10-26: qty 5

## 2011-10-26 MED ORDER — OXYTOCIN 40 UNITS IN LACTATED RINGERS INFUSION - SIMPLE MED
1.0000 m[IU]/min | INTRAVENOUS | Status: DC
  Administered 2011-10-26: 2 m[IU]/min via INTRAVENOUS

## 2011-10-26 MED ORDER — FERROUS SULFATE 325 (65 FE) MG PO TABS
325.0000 mg | ORAL_TABLET | Freq: Two times a day (BID) | ORAL | Status: DC
  Administered 2011-10-27 – 2011-10-28 (×3): 325 mg via ORAL
  Filled 2011-10-26 (×3): qty 1

## 2011-10-26 MED ORDER — MEASLES, MUMPS & RUBELLA VAC ~~LOC~~ INJ: 0.5000 mL | INJECTION | Freq: Once | SUBCUTANEOUS | Status: DC

## 2011-10-26 MED ORDER — MAGNESIUM HYDROXIDE 400 MG/5ML PO SUSP: 30.0000 mL | ORAL | Status: DC | PRN

## 2011-10-26 MED ORDER — OXYTOCIN 40 UNITS IN LACTATED RINGERS INFUSION - SIMPLE MED
62.5000 mL/h | Freq: Once | INTRAVENOUS | Status: DC
  Filled 2011-10-26: qty 1000

## 2011-10-26 MED ORDER — ONDANSETRON HCL 4 MG/2ML IJ SOLN
4.0000 mg | Freq: Four times a day (QID) | INTRAMUSCULAR | Status: DC | PRN
  Administered 2011-10-26: 4 mg via INTRAVENOUS
  Filled 2011-10-26: qty 2

## 2011-10-26 MED ORDER — SENNOSIDES-DOCUSATE SODIUM 8.6-50 MG PO TABS
2.0000 | ORAL_TABLET | Freq: Every day | ORAL | Status: DC
  Administered 2011-10-26 – 2011-10-27 (×2): 2 via ORAL

## 2011-10-26 MED ORDER — FENTANYL 2.5 MCG/ML BUPIVACAINE 1/10 % EPIDURAL INFUSION (WH - ANES)
INTRAMUSCULAR | Status: AC
  Filled 2011-10-26: qty 60

## 2011-10-26 MED ORDER — TERBUTALINE SULFATE 1 MG/ML IJ SOLN: 0.2500 mg | Freq: Once | INTRAMUSCULAR | Status: DC | PRN

## 2011-10-26 MED ORDER — FENTANYL 2.5 MCG/ML BUPIVACAINE 1/10 % EPIDURAL INFUSION (WH - ANES): 14.0000 mL/h | INTRAMUSCULAR | Status: DC

## 2011-10-26 MED ORDER — WITCH HAZEL-GLYCERIN EX PADS
1.0000 "application " | MEDICATED_PAD | CUTANEOUS | Status: DC | PRN
  Administered 2011-10-26: 1 via TOPICAL

## 2011-10-26 MED ORDER — LACTATED RINGERS IV SOLN: 500.0000 mL | INTRAVENOUS | Status: DC | PRN

## 2011-10-26 MED ORDER — OXYCODONE-ACETAMINOPHEN 5-325 MG PO TABS: 1.0000 | ORAL_TABLET | ORAL | Status: DC | PRN

## 2011-10-26 MED ORDER — LACTATED RINGERS IV SOLN
INTRAVENOUS | Status: DC
  Administered 2011-10-26 (×2): 125 mL/h via INTRAVENOUS

## 2011-10-26 MED ORDER — OXYCODONE-ACETAMINOPHEN 5-325 MG PO TABS
1.0000 | ORAL_TABLET | ORAL | Status: DC | PRN
  Administered 2011-10-27: 1 via ORAL
  Filled 2011-10-26: qty 1

## 2011-10-26 MED ORDER — ONDANSETRON HCL 4 MG/2ML IJ SOLN: 4.0000 mg | INTRAMUSCULAR | Status: DC | PRN

## 2011-10-26 MED ORDER — PENICILLIN G POTASSIUM 5000000 UNITS IJ SOLR
5.0000 10*6.[IU] | Freq: Once | INTRAVENOUS | Status: AC
  Administered 2011-10-26: 5 10*6.[IU] via INTRAVENOUS
  Filled 2011-10-26: qty 5

## 2011-10-26 MED ORDER — PENICILLIN G POTASSIUM 5000000 UNITS IJ SOLR
2.5000 10*6.[IU] | INTRAVENOUS | Status: DC
  Administered 2011-10-26: 2.5 10*6.[IU] via INTRAVENOUS
  Filled 2011-10-26 (×5): qty 2.5

## 2011-10-26 MED ORDER — METHYLERGONOVINE MALEATE 0.2 MG PO TABS: 0.2000 mg | ORAL_TABLET | ORAL | Status: DC | PRN

## 2011-10-26 NOTE — Progress Notes (Signed)
Kristie Sandoval is a 26 y.o. G2P0010 at [redacted]w[redacted]d admitted for active labor  Subjective: Comfortable with epidural  Objective: BP 97/82  Pulse 63  Temp 97.7 F (36.5 C) (Oral)  Resp 18  Ht 5\' 3"  (1.6 m)  Wt 176 lb (79.833 kg)  BMI 31.18 kg/m2  SpO2 100%  LMP 02/05/2011      FHT:  FHR: 130 bpm, variability: moderate,  accelerations:  Present,  decelerations:  Present variable UC:   regular, every 2-8 minutes SVE:   Dilation: 5 Effacement (%): 90 Station: -1 Exam by:: Dr. Elwyn Reach   Labs: Lab Results  Component Value Date   WBC 9.2 10/26/2011   HGB 10.2* 10/26/2011   HCT 30.9* 10/26/2011   MCV 86.6 10/26/2011   PLT 312 10/26/2011    Assessment / Plan: Spontaneous labor, progressing normally  Labor: Progressing normally, AROM with clear fluid Fetal Wellbeing:  Category II Pain Control:  Epidural I/D:  PCN for GBS positive Anticipated MOD:  NSVD  BOOTH, [place] 10/26/2011, 1:07 PM

## 2011-10-26 NOTE — Progress Notes (Signed)
Kristie Sandoval is a 26 y.o. G2P0010 at [redacted]w[redacted]d admitted for active labor  Subjective: Doing well after epidural placement.  Contractions spaced out some.  Objective: BP 110/71  Pulse 63  Temp 97.6 F (36.4 C) (Oral)  Resp 18  Ht 5\' 3"  (1.6 m)  Wt 176 lb (79.833 kg)  BMI 31.18 kg/m2  SpO2 100%  LMP 02/05/2011      FHT:  FHR: 135 bpm, variability: moderate,  accelerations:  Present,  decelerations:  Absent; did have one prolonged decel after epidural placed UC:   regular, every 3-7 minutes SVE:   Dilation: 4 Effacement (%): 90 Station: -2 (Bulging membranes) Exam by:: Despina Hick, MD   Labs: Lab Results  Component Value Date   WBC 9.2 10/26/2011   HGB 10.2* 10/26/2011   HCT 30.9* 10/26/2011   MCV 86.6 10/26/2011   PLT 312 10/26/2011    Assessment / Plan: Spontaneous labor, progressing normally  Labor: Progressing normally and will AROM at next check Fetal Wellbeing:  Category I Pain Control:  Epidural I/D:  PCN for GBS positive Anticipated MOD:  NSVD  Sandoval, Kristie Abeyta 10/26/2011, 10:50 AM

## 2011-10-26 NOTE — Anesthesia Procedure Notes (Signed)
Epidural Patient location during procedure: OB Start time: 10/26/2011 10:06 AM End time: 10/26/2011 10:11 AM Reason for block: procedure for pain  Staffing Anesthesiologist: Sandrea Hughs Performed by: anesthesiologist   Preanesthetic Checklist Completed: patient identified, site marked, surgical consent, pre-op evaluation, timeout performed, IV checked, risks and benefits discussed and monitors and equipment checked  Epidural Patient position: sitting Prep: site prepped and draped and DuraPrep Patient monitoring: continuous pulse ox and blood pressure Approach: midline Injection technique: LOR air  Needle:  Needle type: Tuohy  Needle gauge: 17 G Needle length: 9 cm Needle insertion depth: 8 cm Catheter type: closed end flexible Catheter size: 19 Gauge Catheter at skin depth: 13 cm Test dose: negative and Other  Assessment Sensory level: T8 Events: blood not aspirated, injection not painful, no injection resistance, negative IV test and no paresthesia

## 2011-10-26 NOTE — MAU Note (Signed)
Pt U/C's started 0200 this am and has gotten progressively every 3 min.  Vaginal spotting only and no ROM.  Good FM.

## 2011-10-26 NOTE — Progress Notes (Signed)
Kristie Sandoval is a 26 y.o. G2P0010 at [redacted]w[redacted]d admitted for active labor  Subjective: Comfortable with epidural  Objective: BP 108/61  Pulse 60  Temp 97.5 F (36.4 C) (Oral)  Resp 18  Ht 5\' 3"  (1.6 m)  Wt 176 lb (79.833 kg)  BMI 31.18 kg/m2  SpO2 100%  LMP 02/05/2011      FHT:  FHR: 135 bpm, variability: moderate,  accelerations:  Present,  decelerations:  Present variable UC:   regular, every 2-7 minutes SVE:   Dilation: 5 Effacement (%): 90 Station: -1 Exam by:: Dr. Elwyn Reach  Labs: Lab Results  Component Value Date   WBC 9.2 10/26/2011   HGB 10.2* 10/26/2011   HCT 30.9* 10/26/2011   MCV 86.6 10/26/2011   PLT 312 10/26/2011    Assessment / Plan: Spontaneous labor, progressing normally  Labor: Progressing normally and will start pitocin if no change in 2 hours Fetal Wellbeing:  Category II Pain Control:  Epidural I/D:  PCN for GBS positive Anticipated MOD:  NSVD  BOOTH, Kristie Sandoval 10/26/2011, 3:28 PM

## 2011-10-26 NOTE — Anesthesia Preprocedure Evaluation (Signed)
Anesthesia Evaluation  Patient identified by MRN, date of birth, ID band Patient awake    Reviewed: Allergy & Precautions, H&P , NPO status , Patient's Chart, lab work & pertinent test results  Airway Mallampati: II TM Distance: >3 FB Neck ROM: full    Dental No notable dental hx.    Pulmonary neg pulmonary ROS,    Pulmonary exam normal       Cardiovascular negative cardio ROS      Neuro/Psych negative psych ROS   GI/Hepatic negative GI ROS, Neg liver ROS,   Endo/Other  negative endocrine ROS  Renal/GU negative Renal ROS  negative genitourinary   Musculoskeletal negative musculoskeletal ROS (+)   Abdominal Normal abdominal exam  (+)   Peds negative pediatric ROS (+)  Hematology negative hematology ROS (+)   Anesthesia Other Findings   Reproductive/Obstetrics (+) Pregnancy                           Anesthesia Physical Anesthesia Plan  ASA: II  Anesthesia Plan: Epidural   Post-op Pain Management:    Induction:   Airway Management Planned:   Additional Equipment:   Intra-op Plan:   Post-operative Plan:   Informed Consent: I have reviewed the patients History and Physical, chart, labs and discussed the procedure including the risks, benefits and alternatives for the proposed anesthesia with the patient or authorized representative who has indicated his/her understanding and acceptance.     Plan Discussed with:   Anesthesia Plan Comments:         Anesthesia Quick Evaluation  

## 2011-10-26 NOTE — H&P (Signed)
Kristie Sandoval is a 26 y.o. female presenting for contractions.  Started yesterday morning.  Now coming every 3 minutes; rated as strong.  No loss of fluid.  Some bloody mucous today.  Good fetal movement.  PNC at HD.   Maternal Medical History:  Reason for admission: Reason for admission: contractions and nausea.  Contractions: Onset was 13-24 hours ago.   Frequency: regular.   Perceived severity is strong.    Fetal activity: Perceived fetal activity is normal.   Last perceived fetal movement was within the past hour.    Prenatal complications: No bleeding or hypertension.   Prenatal Complications - Diabetes: none.    OB History    Grav Para Term Preterm Abortions TAB SAB Ect Mult Living   2 0 0 0 1 0 1 0 0 0      Past Medical History  Diagnosis Date  . Trichomonas   . Headache     no hx prior to preg  . GERD (gastroesophageal reflux disease)   . Gonorrhea, current pregnancy 12/12    Test of cure negative in April  . Chlamydia infection, current pregnancy 12/12    Test of cure negative in April   Past Surgical History  Procedure Date  . No past surgeries    Family History: family history includes Diabetes in her maternal grandmother.  There is no history of Anesthesia problems, and Hypotension, and Malignant hyperthermia, and Pseudochol deficiency, and Other, . Social History:  reports that she quit smoking about 8 months ago. Her smoking use included Cigarettes. She smoked 0 packs per day. She has never used smokeless tobacco. She reports that she does not drink alcohol or use illicit drugs.   Prenatal Transfer Tool  Maternal Diabetes: No Genetic Screening: Normal Maternal Ultrasounds/Referrals: Normal Fetal Ultrasounds or other Referrals:  None Maternal Substance Abuse:  Yes:  Type: Smoker - quit in early pregnancy Significant Maternal Medications:  Meds include: Zantac Significant Maternal Lab Results:  Lab values include: Group B Strep positive Other Comments:   None  Review of Systems  Constitutional: Negative for fever and chills.  Respiratory: Negative for shortness of breath.   Cardiovascular: Negative for chest pain.  Gastrointestinal: Positive for nausea.  Neurological: Negative for dizziness and headaches.    Dilation: 4 Effacement (%): 80 Station: -2 (Bulging membranes) Exam by:: L. Paschal, RN Last menstrual period 02/05/2011. Maternal Exam:  Uterine Assessment: Contraction strength is moderate.  Contraction frequency is regular.   Abdomen: Patient reports no abdominal tenderness. Fundal height is appropriate for dates.   Estimated fetal weight is 7.5.   Fetal presentation: vertex  Cervix: Cervix evaluated by digital exam.     Fetal Exam Fetal Monitor Review: Mode: ultrasound.   Baseline rate: 135.  Variability: moderate (6-25 bpm).   Pattern: accelerations present and no decelerations.    Fetal State Assessment: Category I - tracings are normal.     Physical Exam  Constitutional: She is oriented to person, place, and time. She appears well-developed and well-nourished. She appears distressed (with contractions).  HENT:  Head: Normocephalic and atraumatic.  Eyes: No scleral icterus.  Neck: Normal range of motion. Neck supple.  Cardiovascular: Normal rate, regular rhythm and normal heart sounds.   No murmur heard. Respiratory: Effort normal and breath sounds normal.  GI:       Gravid  Musculoskeletal: She exhibits no edema and no tenderness.  Neurological: She is alert and oriented to person, place, and time. She exhibits normal muscle tone.  Skin: Skin is dry. No rash noted. No erythema.  Psychiatric: She has a normal mood and affect. Her behavior is normal.    Prenatal labs: ABO, Rh: --/--/O POS (01/28 2323) Antibody: NEG (01/28 2318) Rubella:   RPR:    HBsAg:    HIV:    GBS:     Assessment/Plan: 26 yo G2P0010 at [redacted]w[redacted]d in active labor - admit to L&D - GBS positive - start PCN - labor support -  planning epidural   BOOTH, ERIN 10/26/2011, 9:07 AM   Patient seen and examined.  Agree with above note.  Levie Heritage, DO 10/26/2011 10:28 AM

## 2011-10-27 ENCOUNTER — Encounter (HOSPITAL_COMMUNITY): Payer: Self-pay | Admitting: *Deleted

## 2011-10-27 MED ORDER — PANTOPRAZOLE SODIUM 20 MG PO TBEC
20.0000 mg | DELAYED_RELEASE_TABLET | Freq: Every day | ORAL | Status: DC
  Administered 2011-10-27: 20 mg via ORAL
  Filled 2011-10-27 (×2): qty 1

## 2011-10-27 MED ORDER — FAMOTIDINE 20 MG PO TABS
20.0000 mg | ORAL_TABLET | Freq: Two times a day (BID) | ORAL | Status: DC
  Administered 2011-10-27 – 2011-10-28 (×3): 20 mg via ORAL
  Filled 2011-10-27 (×3): qty 1

## 2011-10-27 NOTE — Anesthesia Postprocedure Evaluation (Signed)
Anesthesia Post Note  Patient: Kristie Sandoval  Procedure(s) Performed: * No procedures listed *  Anesthesia type: Epidural  Patient location: Mother/Baby  Post pain: Pain level controlled  Post assessment: Post-op Vital signs reviewed  Last Vitals:  Filed Vitals:   10/27/11 0546  BP: 105/65  Pulse: 67  Temp: 36.8 C  Resp: 18    Post vital signs: Reviewed  Level of consciousness: awake  Complications: No apparent anesthesia complications

## 2011-10-27 NOTE — Plan of Care (Signed)
Problem: Phase I Progression Outcomes Goal: Other Phase I Outcomes/Goals Outcome: Progressing HIV blood work ordered as no result obtainable from prenatal record.

## 2011-10-27 NOTE — Progress Notes (Signed)
Post Partum Day 1 Subjective: Doing well, other than discomfort described as pressure above umbilicus.   Objective: Blood pressure 105/65, pulse 67, temperature 98.2 F (36.8 C), temperature source Oral, resp. rate 18, height 5\' 3"  (1.6 m), weight 79.833 kg (176 lb), last menstrual period 02/05/2011, SpO2 100.00%, unknown if currently breastfeeding.  Physical Exam:  General: alert and cooperative Lochia: appropriate Uterine Fundus: firm DVT Evaluation: No evidence of DVT seen on physical exam.   Basename 10/26/11 0925  HGB 10.2*  HCT 30.9*    Assessment/Plan: Plan for discharge tomorrow and Breastfeeding Encourage ambulation today   LOS: 1 day   Alpha Mysliwiec N 10/27/2011, 7:16 AM

## 2011-10-27 NOTE — Progress Notes (Signed)
I have seen and examined this patient and agree the above assessment. CRESENZO-DISHMAN,Haylei Cobin 10/27/2011 7:32 AM

## 2011-10-27 NOTE — Progress Notes (Signed)
UR Chart review completed.  

## 2011-10-28 MED ORDER — IBUPROFEN 600 MG PO TABS: 600.0000 mg | ORAL_TABLET | Freq: Four times a day (QID) | ORAL | Status: AC

## 2011-10-28 NOTE — Discharge Summary (Signed)
Obstetric Discharge Summary Reason for Admission: onset of labor Prenatal Procedures: none Intrapartum Procedures: spontaneous vaginal delivery and GBS prophylaxis Postpartum Procedures: none Complications-Operative and Postpartum: 1st degree perineal laceration Hemoglobin  Date Value Range Status  10/26/2011 10.2* 12.0 - 15.0 g/dL Final     HCT  Date Value Range Status  10/26/2011 30.9* 36.0 - 46.0 % Final    Physical Exam:  General: alert and cooperative Lochia: appropriate Uterine Fundus: firm Incision: n/a DVT Evaluation: No evidence of DVT seen on physical exam. Negative Homan's sign. No cords or calf tenderness. No significant calf/ankle edema.  Discharge Diagnoses: Term Pregnancy-delivered  Discharge Information: Date: 10/28/2011 Activity: unrestricted Diet: routine Medications: PNV and Ibuprofen Condition: stable Instructions: refer to practice specific booklet Discharge to: home Follow-up Information    Schedule an appointment as soon as possible for a visit to follow up. (Please call the Health department as soon as possibel to schedule a postpartum visit)          Newborn Data: Live born female  Birth Weight: 6 lb 5 oz (2863 g) APGAR: 8, 9  Home with mother.  Marikay Alar 10/28/2011, 7:18 AM  I have seen this patient and agree with the above resident's note.  LEFTWICH-KIRBY, LISA Certified Nurse-Midwife

## 2012-04-08 ENCOUNTER — Encounter (HOSPITAL_COMMUNITY): Payer: Self-pay

## 2012-04-08 ENCOUNTER — Emergency Department (HOSPITAL_COMMUNITY)
Admission: EM | Admit: 2012-04-08 | Discharge: 2012-04-08 | Disposition: A | Discharge status: Home Health | Payer: Self-pay | Attending: Emergency Medicine | Admitting: Emergency Medicine

## 2012-04-08 DIAGNOSIS — R6883 Chills (without fever): Secondary | ICD-10-CM | POA: Insufficient documentation

## 2012-04-08 DIAGNOSIS — Z79899 Other long term (current) drug therapy: Secondary | ICD-10-CM | POA: Insufficient documentation

## 2012-04-08 DIAGNOSIS — Z87891 Personal history of nicotine dependence: Secondary | ICD-10-CM | POA: Insufficient documentation

## 2012-04-08 DIAGNOSIS — M549 Dorsalgia, unspecified: Secondary | ICD-10-CM | POA: Insufficient documentation

## 2012-04-08 DIAGNOSIS — Z8619 Personal history of other infectious and parasitic diseases: Secondary | ICD-10-CM | POA: Insufficient documentation

## 2012-04-08 DIAGNOSIS — K2970 Gastritis, unspecified, without bleeding: Secondary | ICD-10-CM

## 2012-04-08 DIAGNOSIS — A088 Other specified intestinal infections: Secondary | ICD-10-CM | POA: Insufficient documentation

## 2012-04-08 DIAGNOSIS — R350 Frequency of micturition: Secondary | ICD-10-CM | POA: Insufficient documentation

## 2012-04-08 DIAGNOSIS — R112 Nausea with vomiting, unspecified: Secondary | ICD-10-CM | POA: Insufficient documentation

## 2012-04-08 DIAGNOSIS — K219 Gastro-esophageal reflux disease without esophagitis: Secondary | ICD-10-CM | POA: Insufficient documentation

## 2012-04-08 LAB — URINALYSIS, ROUTINE W REFLEX MICROSCOPIC
Glucose, UA: 100 mg/dL — AB
Hgb urine dipstick: NEGATIVE
Ketones, ur: NEGATIVE mg/dL
Protein, ur: 100 mg/dL — AB
pH: 8.5 — ABNORMAL HIGH (ref 5.0–8.0)

## 2012-04-08 LAB — CBC
Hemoglobin: 13.7 g/dL (ref 12.0–15.0)
MCH: 31.5 pg (ref 26.0–34.0)
MCHC: 35.4 g/dL (ref 30.0–36.0)
MCV: 89 fL (ref 78.0–100.0)
RBC: 4.35 MIL/uL (ref 3.87–5.11)

## 2012-04-08 LAB — POCT I-STAT, CHEM 8
BUN: 10 mg/dL (ref 6–23)
Calcium, Ion: 1.15 mmol/L (ref 1.12–1.23)
Chloride: 105 mEq/L (ref 96–112)
Creatinine, Ser: 0.9 mg/dL (ref 0.50–1.10)
Glucose, Bld: 105 mg/dL — ABNORMAL HIGH (ref 70–99)
TCO2: 25 mmol/L (ref 0–100)

## 2012-04-08 LAB — URINE MICROSCOPIC-ADD ON

## 2012-04-08 MED ORDER — POTASSIUM CHLORIDE 10 MEQ/100ML IV SOLN
10.0000 meq | INTRAVENOUS | Status: DC
  Administered 2012-04-08: 10 meq via INTRAVENOUS
  Filled 2012-04-08: qty 100

## 2012-04-08 MED ORDER — LORAZEPAM 2 MG/ML IJ SOLN: 0.5000 mg | Freq: Once | INTRAMUSCULAR | Status: DC

## 2012-04-08 MED ORDER — ONDANSETRON HCL 4 MG/2ML IJ SOLN
4.0000 mg | Freq: Once | INTRAMUSCULAR | Status: AC
  Administered 2012-04-08: 4 mg via INTRAVENOUS

## 2012-04-08 MED ORDER — ONDANSETRON HCL 4 MG/2ML IJ SOLN
4.0000 mg | Freq: Once | INTRAMUSCULAR | Status: AC
  Administered 2012-04-08: 4 mg via INTRAVENOUS
  Filled 2012-04-08 (×2): qty 2

## 2012-04-08 MED ORDER — SODIUM CHLORIDE 0.9 % IV BOLUS (SEPSIS)
1000.0000 mL | Freq: Once | INTRAVENOUS | Status: AC
  Administered 2012-04-08: 1000 mL via INTRAVENOUS

## 2012-04-08 MED ORDER — LORAZEPAM 2 MG/ML IJ SOLN
0.5000 mg | Freq: Once | INTRAMUSCULAR | Status: AC
  Administered 2012-04-08: 0.5 mg via INTRAVENOUS
  Filled 2012-04-08: qty 1

## 2012-04-08 MED ORDER — DIAZEPAM 2 MG PO TABS: 1.0000 mg | ORAL_TABLET | Freq: Four times a day (QID) | ORAL | Status: DC | PRN

## 2012-04-08 MED ORDER — HYDROMORPHONE HCL PF 1 MG/ML IJ SOLN
1.0000 mg | Freq: Once | INTRAMUSCULAR | Status: AC
  Administered 2012-04-08: 1 mg via INTRAVENOUS
  Filled 2012-04-08: qty 1

## 2012-04-08 MED ORDER — POTASSIUM CHLORIDE CRYS ER 20 MEQ PO TBCR
10.0000 meq | EXTENDED_RELEASE_TABLET | Freq: Once | ORAL | Status: AC
  Administered 2012-04-08: 10 meq via ORAL
  Filled 2012-04-08: qty 1

## 2012-04-08 MED ORDER — HYDROMORPHONE HCL PF 1 MG/ML IJ SOLN
1.0000 mg | Freq: Once | INTRAMUSCULAR | Status: DC
  Filled 2012-04-08: qty 1

## 2012-04-08 MED ORDER — ONDANSETRON HCL 4 MG/2ML IJ SOLN
4.0000 mg | Freq: Once | INTRAMUSCULAR | Status: AC
  Administered 2012-04-08: 4 mg via INTRAVENOUS
  Filled 2012-04-08: qty 2

## 2012-04-08 MED ORDER — ONDANSETRON HCL 4 MG PO TABS: 4.0000 mg | ORAL_TABLET | Freq: Three times a day (TID) | ORAL | Status: DC | PRN

## 2012-04-08 MED ORDER — HYDROMORPHONE HCL PF 1 MG/ML IJ SOLN
1.0000 mg | Freq: Once | INTRAMUSCULAR | Status: AC
  Administered 2012-04-08: 1 mg via INTRAVENOUS

## 2012-04-08 MED ORDER — ONDANSETRON HCL 4 MG/2ML IJ SOLN
4.0000 mg | Freq: Once | INTRAMUSCULAR | Status: DC
  Filled 2012-04-08: qty 2

## 2012-04-08 NOTE — ED Provider Notes (Signed)
Medical screening examination/treatment/procedure(s) were conducted as a shared visit with non-physician practitioner(s) and myself.  I personally evaluated the patient during the encounter  Please see my separate respective documentation pertaining to this patient encounter   Beautifull Cisar D Zimri Brennen, MD 04/08/12 2321 

## 2012-04-08 NOTE — ED Provider Notes (Signed)
History     CSN: [id number]  Arrival date & time 04/08/12  8295   First MD Initiated Contact with Patient 04/08/12 [phone]      Chief Complaint  Patient presents with  . Abdominal Pain    (Consider location/radiation/quality/duration/timing/severity/associated sxs/prior treatment) HPI Comments: Sudden gradual onset of diffuse abdominal pain , nausea vomiting  1 loose BM, and onset of menses Patient has Jearld Adjutant in place denies vaginal discharge   Patient is a 27 y.o. female presenting with abdominal pain. The history is provided by the patient.  Abdominal Pain The primary symptoms of the illness include abdominal pain, nausea and vomiting. The primary symptoms of the illness do not include fever, fatigue, shortness of breath, diarrhea, dysuria, vaginal discharge or vaginal bleeding. The current episode started 6 to 12 hours ago. The onset of the illness was gradual. The problem has not changed since onset. The abdominal pain began 6 to 12 hours ago. The pain came on gradually. The abdominal pain is generalized. The severity of the abdominal pain is 10/10. The abdominal pain is relieved by nothing.  Nausea began yesterday.  The vomiting began yesterday. Vomiting occurs more than 10 times per day. The emesis contains bilious material.  The patient states that she believes she is currently not pregnant. The patient has not had a change in bowel habit. Additional symptoms associated with the illness include chills, frequency and back pain. Symptoms associated with the illness do not include constipation, urgency or hematuria.    Past Medical History  Diagnosis Date  . Trichomonas   . Headache     no hx prior to preg  . GERD (gastroesophageal reflux disease)   . Gonorrhea, current pregnancy 12/12    Test of cure negative in April  . Chlamydia infection, current pregnancy 12/12    Test of cure negative in April    Past Surgical History  Procedure Date  . No past surgeries     Family  History  Problem Relation Age of Onset  . Anesthesia problems Neg Hx   . Hypotension Neg Hx   . Malignant hyperthermia Neg Hx   . Pseudochol deficiency Neg Hx   . Other Neg Hx   . Diabetes Maternal Grandmother     History  Substance Use Topics  . Smoking status: Former Smoker -- 0.0 packs/day    Types: Cigarettes    Quit date: 02/18/2011  . Smokeless tobacco: Never Used  . Alcohol Use: No    OB History    Grav Para Term Preterm Abortions TAB SAB Ect Mult Living   2 1 1  0 1 0 1 0 0 1      Review of Systems  Constitutional: Positive for chills. Negative for fever and fatigue.  Respiratory: Negative for shortness of breath.   Gastrointestinal: Positive for nausea, vomiting and abdominal pain. Negative for diarrhea and constipation.  Genitourinary: Positive for frequency. Negative for dysuria, urgency, hematuria, vaginal bleeding and vaginal discharge.  Musculoskeletal: Positive for back pain.    Allergies  Review of patient's allergies indicates no known allergies.  Home Medications   Current Outpatient Rx  Name  Route  Sig  Dispense  Refill  . ACETAMINOPHEN 500 MG PO TABS   Oral   Take 500 mg by mouth every 6 (six) hours as needed. For pain         . PROMETHAZINE HCL 25 MG PO TABS   Oral   Take 25 mg by mouth every 8 (eight) hours as  needed. For nausea         . RANITIDINE HCL 150 MG PO TABS   Oral   Take 150 mg by mouth 2 (two) times daily as needed. For acid reflux           BP 91/56  Pulse 72  Temp 97.9 F (36.6 C) (Oral)  Resp 18  Ht 5\' 5"  (1.651 m)  Wt 160 lb (72.576 kg)  BMI 26.63 kg/m2  SpO2 100%  LMP 04/08/2012  Breastfeeding? No  Physical Exam  Constitutional: She is oriented to person, place, and time. She appears well-developed and well-nourished.  HENT:  Head: Normocephalic.  Pulmonary/Chest: Effort normal and breath sounds normal. She exhibits no tenderness.  Abdominal: Soft. She exhibits no distension and no mass. There is  generalized tenderness. There is no rebound and no guarding.  Musculoskeletal: Normal range of motion. She exhibits no edema and no tenderness.  Neurological: She is alert and oriented to person, place, and time.  Skin: Skin is warm. No rash noted. No erythema.    ED Course  Procedures (including critical care time)  Labs Reviewed  URINALYSIS, ROUTINE W REFLEX MICROSCOPIC - Abnormal; Notable for the following:    pH 8.5 (*)     Glucose, UA 100 (*)     Bilirubin Urine SMALL (*)     Protein, ur 100 (*)     All other components within normal limits  POCT I-STAT, CHEM 8 - Abnormal; Notable for the following:    Potassium 3.3 (*)     Glucose, Bld 105 (*)     All other components within normal limits  CBC  URINE MICROSCOPIC-ADD ON   No results found.   No diagnosis found.    MDM  Patient no longer writhing in pain but still uncomfortable and nauseated   Potassium 3.3 will supplement with 20 mEq iv over the next 2 hours         Arman Filter, NP 04/08/12 0602

## 2012-04-08 NOTE — ED Provider Notes (Signed)
Medical screening examination/treatment/procedure(s) were conducted as a shared visit with non-physician practitioner(s) and myself.  I personally evaluated the patient during the encounter  Please see my separate respective documentation pertaining to this patient encounter   Vida Roller, MD 04/08/12 2321

## 2012-04-08 NOTE — ED Provider Notes (Signed)
Assumed care of the patient from NP [facility]. 27 year old female presents to the emergency department with chief complaint of sudden onset nausea, epigastric abdominal pain and vomiting.  The began are proximally 5:00 PM last night.  Patient states he came out of nowhere.  She then developed epigastric abdominal pain.  She also had several episodes of watery stool.  Her boyfriend had similar symptoms last week.  Patient denies any previous history.  Denies any urinary or vaginal symptoms.  Mirena IUD in place.  Patient complains of continued nausea.  She's had 2 rounds of Zofran and Dilaudid-HP.  She denies any pain at this time.  Abdominal exam presents diffuse tenderness.  Worse in the epigastrium.  No peritoneal signs.  No focal abdominal pain.  She denies hematemesis or hematochezia.  No fevers. Plan is to repeat dose of Zofran with half milligram of Ativan.  Will recheck and 20 minutes.  Patient has received IV fluids.  Seen in shared visit with Dr. Hyacinth Meeker. Please see his note for Physical exam.  9:01 AM Still having mild nausea but tolerating PO fluids and oral potassium.  Will dc with antiemetic. Discussed reasons to seek immediate care. Patient expresses understanding and agrees with plan.   Arthor Captain, PA-C 04/08/12 0901  Arthor Captain, PA-C 04/08/12 [phone]

## 2012-04-08 NOTE — ED Notes (Signed)
Pt discharged to home with family. NAD.  

## 2012-04-08 NOTE — ED Notes (Signed)
Per EMS pt c/o  with abdominal pain since 1700. Pt moaning and rocking side to side. Pt also c/o nausea and HA.
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
## 2012-08-09 ENCOUNTER — Emergency Department (HOSPITAL_COMMUNITY)
Admission: EM | Admit: 2012-08-09 | Discharge: 2012-08-09 | Disposition: A | Discharge status: Home / Self Care | Payer: No Typology Code available for payment source | Attending: Emergency Medicine | Admitting: Emergency Medicine

## 2012-08-09 ENCOUNTER — Encounter (HOSPITAL_COMMUNITY): Payer: Self-pay

## 2012-08-09 DIAGNOSIS — Z87891 Personal history of nicotine dependence: Secondary | ICD-10-CM | POA: Insufficient documentation

## 2012-08-09 DIAGNOSIS — Y9289 Other specified places as the place of occurrence of the external cause: Secondary | ICD-10-CM | POA: Insufficient documentation

## 2012-08-09 DIAGNOSIS — Z8619 Personal history of other infectious and parasitic diseases: Secondary | ICD-10-CM | POA: Insufficient documentation

## 2012-08-09 DIAGNOSIS — IMO0002 Reserved for concepts with insufficient information to code with codable children: Secondary | ICD-10-CM | POA: Insufficient documentation

## 2012-08-09 DIAGNOSIS — Z79899 Other long term (current) drug therapy: Secondary | ICD-10-CM | POA: Insufficient documentation

## 2012-08-09 DIAGNOSIS — Y9389 Activity, other specified: Secondary | ICD-10-CM | POA: Insufficient documentation

## 2012-08-09 DIAGNOSIS — K219 Gastro-esophageal reflux disease without esophagitis: Secondary | ICD-10-CM | POA: Insufficient documentation

## 2012-08-09 DIAGNOSIS — Z8679 Personal history of other diseases of the circulatory system: Secondary | ICD-10-CM | POA: Insufficient documentation

## 2012-08-09 DIAGNOSIS — M549 Dorsalgia, unspecified: Secondary | ICD-10-CM

## 2012-08-09 MED ORDER — CYCLOBENZAPRINE HCL 10 MG PO TABS: 10.0000 mg | ORAL_TABLET | Freq: Two times a day (BID) | ORAL | Status: DC | PRN

## 2012-08-09 MED ORDER — IBUPROFEN 400 MG PO TABS: 400.0000 mg | ORAL_TABLET | Freq: Four times a day (QID) | ORAL | Status: DC | PRN
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
## 2012-08-10 NOTE — ED Provider Notes (Signed)
Medical screening examination/treatment/procedure(s) were performed by non-physician practitioner and as supervising physician I was immediately available for consultation/collaboration.  Arley Phenix, MD 08/10/12 0005

## 2012-08-11 ENCOUNTER — Emergency Department (HOSPITAL_COMMUNITY)
Admission: EM | Admit: 2012-08-11 | Discharge: 2012-08-12 | Disposition: A | Discharge status: Home / Self Care | Payer: No Typology Code available for payment source | Attending: Emergency Medicine | Admitting: Emergency Medicine

## 2012-08-11 ENCOUNTER — Emergency Department (HOSPITAL_COMMUNITY): Payer: No Typology Code available for payment source

## 2012-08-11 DIAGNOSIS — Z87891 Personal history of nicotine dependence: Secondary | ICD-10-CM | POA: Insufficient documentation

## 2012-08-11 DIAGNOSIS — S39012A Strain of muscle, fascia and tendon of lower back, initial encounter: Secondary | ICD-10-CM

## 2012-08-11 DIAGNOSIS — Z8719 Personal history of other diseases of the digestive system: Secondary | ICD-10-CM | POA: Insufficient documentation

## 2012-08-11 DIAGNOSIS — Y9389 Activity, other specified: Secondary | ICD-10-CM | POA: Insufficient documentation

## 2012-08-11 DIAGNOSIS — Z8619 Personal history of other infectious and parasitic diseases: Secondary | ICD-10-CM | POA: Insufficient documentation

## 2012-08-11 DIAGNOSIS — S335XXA Sprain of ligaments of lumbar spine, initial encounter: Secondary | ICD-10-CM | POA: Insufficient documentation

## 2012-08-11 DIAGNOSIS — S298XXA Other specified injuries of thorax, initial encounter: Secondary | ICD-10-CM | POA: Insufficient documentation

## 2012-08-11 DIAGNOSIS — R0789 Other chest pain: Secondary | ICD-10-CM

## 2012-08-11 DIAGNOSIS — M549 Dorsalgia, unspecified: Secondary | ICD-10-CM | POA: Insufficient documentation

## 2012-08-11 DIAGNOSIS — Y9289 Other specified places as the place of occurrence of the external cause: Secondary | ICD-10-CM | POA: Insufficient documentation

## 2012-08-11 MED ORDER — HYDROCODONE-ACETAMINOPHEN 5-325 MG PO TABS: 1.0000 | ORAL_TABLET | Freq: Four times a day (QID) | ORAL | Status: DC | PRN

## 2012-08-11 MED ORDER — IBUPROFEN 800 MG PO TABS: 800.0000 mg | ORAL_TABLET | Freq: Three times a day (TID) | ORAL | Status: DC | PRN

## 2012-08-11 MED ORDER — HYDROCODONE-ACETAMINOPHEN 5-325 MG PO TABS
1.0000 | ORAL_TABLET | Freq: Once | ORAL | Status: AC
  Administered 2012-08-11: 1 via ORAL
  Filled 2012-08-11: qty 1

## 2012-08-11 MED ORDER — IBUPROFEN 800 MG PO TABS
800.0000 mg | ORAL_TABLET | Freq: Once | ORAL | Status: AC
  Administered 2012-08-11: 800 mg via ORAL
  Filled 2012-08-11: qty 1
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
## 2012-08-16 NOTE — ED Provider Notes (Signed)
Medical screening examination/treatment/procedure(s) were performed by non-physician practitioner and as supervising physician I was immediately available for consultation/collaboration.    Fern Canova R Marlina Cataldi, MD 08/16/12 0721 

## 2012-12-24 ENCOUNTER — Encounter (HOSPITAL_COMMUNITY): Payer: Self-pay | Admitting: Emergency Medicine

## 2012-12-24 ENCOUNTER — Emergency Department (HOSPITAL_COMMUNITY)
Admission: EM | Admit: 2012-12-24 | Discharge: 2012-12-24 | Disposition: A | Discharge status: Home / Self Care | Payer: Medicaid Other | Source: Home / Self Care | Attending: Family Medicine | Admitting: Family Medicine

## 2012-12-24 DIAGNOSIS — IMO0002 Reserved for concepts with insufficient information to code with codable children: Secondary | ICD-10-CM

## 2012-12-24 DIAGNOSIS — L03012 Cellulitis of left finger: Secondary | ICD-10-CM

## 2012-12-24 DIAGNOSIS — B9689 Other specified bacterial agents as the cause of diseases classified elsewhere: Secondary | ICD-10-CM

## 2012-12-24 DIAGNOSIS — N760 Acute vaginitis: Secondary | ICD-10-CM

## 2012-12-24 DIAGNOSIS — A499 Bacterial infection, unspecified: Secondary | ICD-10-CM

## 2012-12-24 MED ORDER — DOXYCYCLINE HYCLATE 100 MG PO CAPS: 100.0000 mg | ORAL_CAPSULE | Freq: Two times a day (BID) | ORAL | Status: DC

## 2012-12-24 MED ORDER — METRONIDAZOLE 500 MG PO TABS: 500.0000 mg | ORAL_TABLET | Freq: Two times a day (BID) | ORAL | Status: DC

## 2012-12-24 MED ORDER — FLUCONAZOLE 150 MG PO TABS: 150.0000 mg | ORAL_TABLET | Freq: Once | ORAL | Status: DC

## 2012-12-24 MED ORDER — MELOXICAM 15 MG PO TABS: 15.0000 mg | ORAL_TABLET | Freq: Every day | ORAL | Status: DC | PRN

## 2012-12-24 NOTE — ED Notes (Signed)
C/o pain in left middle finger x 1 wk. Also c/o vaginal itching. Denies discharge and odor. No relief with monistat. X 1 wk  Recent change in soaps.   Denies any other symptoms.

## 2012-12-24 NOTE — ED Provider Notes (Signed)
Kristie Sandoval is a 27 y.o. female who presents to Urgent Care today for left third digit paronychia. Patient has pain and swelling and redness at the radial border of her left third digit at the present for about one week. She's not tried any medications. She denies any nausea vomiting fevers or chills.  Additionally patient notes continued vaginal itching. She's had multiple episodes of bacterial vaginosis and her symptoms are consistent with that. She notes that she did not finish her treatment with Flagyl the last time she had bacterial vaginosis about a month ago. She thinks she perhaps she has again. She tried using Monistat which do help. She denies any vaginal discharge nausea vomiting diarrhea. She feels well otherwise no new sexual partners.   Past Medical History  Diagnosis Date  . Trichomonas   . Headache(784.0)     no hx prior to preg  . GERD (gastroesophageal reflux disease)   . Gonorrhea, current pregnancy 12/12    Test of cure negative in April  . Chlamydia infection, current pregnancy 12/12    Test of cure negative in April   History  Substance Use Topics  . Smoking status: Former Smoker -- 0.00 packs/day    Types: Cigarettes    Quit date: 02/18/2011  . Smokeless tobacco: Never Used  . Alcohol Use: No   ROS as above Medications reviewed. No current facility-administered medications for this encounter.   Current Outpatient Prescriptions  Medication Sig Dispense Refill  . acetaminophen (TYLENOL) 500 MG tablet Take 1,000 mg by mouth every 6 (six) hours as needed. For pain      . doxycycline (VIBRAMYCIN) 100 MG capsule Take 1 capsule (100 mg total) by mouth 2 (two) times daily.  20 capsule  0  . fluconazole (DIFLUCAN) 150 MG tablet Take 1 tablet (150 mg total) by mouth once.  1 tablet  1  . ibuprofen (ADVIL,MOTRIN) 800 MG tablet Take 1 tablet (800 mg total) by mouth every 8 (eight) hours as needed for pain.  21 tablet  0  . levonorgestrel (MIRENA) 20 MCG/24HR IUD 1  each by Intrauterine route continuous.      . meloxicam (MOBIC) 15 MG tablet Take 1 tablet (15 mg total) by mouth daily as needed for pain.  15 tablet  0  . metroNIDAZOLE (FLAGYL) 500 MG tablet Take 1 tablet (500 mg total) by mouth 2 (two) times daily.  14 tablet  1    Exam:  BP 116/69  Pulse 91  Temp(Src) 98.1 F (36.7 C) (Oral)  Resp 16  SpO2 100%  LMP 12/24/2012  Breastfeeding? No Gen: Well NAD HEENT: EOMI,  MMM Lungs: CTABL Nl WOB Heart: RRR no MRG Abd: NABS, NT, ND Exts: Non edematous BL  LE, warm and well perfused.  Left hand: Erythematous tender swollen radial nail border third digit.   Abscess incision and drainage: Left third digit Consent obtained and timeout performed. Based the finger cleaned with alcohol and 4 mL of lidocaine was injected achieving a digital nerve block. The area of infection was cleaned with alcohol and a small incision was made. No significant pus was able to be expressed. The blood was cultured.  Assessment and Plan: 27 y.o. female with  1) paronychia left third digit: Not yet drainable. Plan to treat with doxycycline. Culture is pending. 2) vaginitis: Patient declined vaginal exam. Symptoms are very much consistent with BV for her. Plan to treat for BV with metronidazole. Additional prescribe fluconazole for yeast treatment after her two antibiotics  finish.      Rodolph Bong, MD 12/24/12 [phone]

## 2012-12-27 LAB — CULTURE, ROUTINE-ABSCESS
Culture: NO GROWTH
Gram Stain: NONE SEEN

## 2013-03-27 ENCOUNTER — Emergency Department (HOSPITAL_COMMUNITY)
Admission: EM | Admit: 2013-03-27 | Discharge: 2013-03-27 | Disposition: A | Discharge status: Home / Self Care | Payer: Medicaid Other | Source: Home / Self Care | Attending: Emergency Medicine | Admitting: Emergency Medicine

## 2013-03-27 ENCOUNTER — Encounter (HOSPITAL_COMMUNITY): Payer: Self-pay | Admitting: Emergency Medicine

## 2013-03-27 ENCOUNTER — Other Ambulatory Visit (HOSPITAL_COMMUNITY)
Admission: RE | Admit: 2013-03-27 | Discharge: 2013-03-27 | Disposition: A | Discharge status: Home / Self Care | Payer: Medicaid Other | Source: Ambulatory Visit | Attending: Emergency Medicine | Admitting: Emergency Medicine

## 2013-03-27 DIAGNOSIS — M533 Sacrococcygeal disorders, not elsewhere classified: Secondary | ICD-10-CM

## 2013-03-27 DIAGNOSIS — Z113 Encounter for screening for infections with a predominantly sexual mode of transmission: Secondary | ICD-10-CM | POA: Insufficient documentation

## 2013-03-27 DIAGNOSIS — J019 Acute sinusitis, unspecified: Secondary | ICD-10-CM

## 2013-03-27 DIAGNOSIS — N760 Acute vaginitis: Secondary | ICD-10-CM | POA: Insufficient documentation

## 2013-03-27 LAB — POCT URINALYSIS DIP (DEVICE)
BILIRUBIN URINE: NEGATIVE
GLUCOSE, UA: NEGATIVE mg/dL
KETONES UR: NEGATIVE mg/dL
Nitrite: NEGATIVE
Protein, ur: NEGATIVE mg/dL
SPECIFIC GRAVITY, URINE: 1.02 (ref 1.005–1.030)
Urobilinogen, UA: 0.2 mg/dL (ref 0.0–1.0)
pH: 6 (ref 5.0–8.0)

## 2013-03-27 LAB — POCT PREGNANCY, URINE: PREG TEST UR: NEGATIVE

## 2013-03-27 MED ORDER — AMOXICILLIN-POT CLAVULANATE 875-125 MG PO TABS: 1.0000 | ORAL_TABLET | Freq: Two times a day (BID) | ORAL | Status: DC

## 2013-03-27 MED ORDER — TRAMADOL HCL 50 MG PO TABS: 100.0000 mg | ORAL_TABLET | Freq: Three times a day (TID) | ORAL | Status: DC | PRN

## 2013-03-27 MED ORDER — ACETAMINOPHEN 325 MG PO TABS
ORAL_TABLET | ORAL | Status: AC
  Filled 2013-03-27: qty 2

## 2013-03-27 MED ORDER — FLUCONAZOLE 150 MG PO TABS: 150.0000 mg | ORAL_TABLET | Freq: Once | ORAL | Status: DC

## 2013-03-27 MED ORDER — HYDROCODONE-ACETAMINOPHEN 5-325 MG PO TABS: 2.0000 | ORAL_TABLET | Freq: Once | ORAL | Status: DC

## 2013-03-27 MED ORDER — ACETAMINOPHEN 325 MG PO TABS
650.0000 mg | ORAL_TABLET | Freq: Once | ORAL | Status: AC
  Administered 2013-03-27: 650 mg via ORAL

## 2013-03-27 MED ORDER — METRONIDAZOLE 500 MG PO TABS: 500.0000 mg | ORAL_TABLET | Freq: Two times a day (BID) | ORAL | Status: DC

## 2013-03-27 MED ORDER — MELOXICAM 15 MG PO TABS: 15.0000 mg | ORAL_TABLET | Freq: Every day | ORAL | Status: DC

## 2013-03-27 NOTE — ED Notes (Signed)
Verified call back number.

## 2013-03-27 NOTE — ED Provider Notes (Signed)
Chief Complaint:   Chief Complaint  Patient presents with  . Vaginal Itching    History of Present Illness:   Kristie Sandoval is a 28 year old female who has had a 3 to four-day history of vaginal itching, odor, and suprapubic pain. For the past months she's had pain in her left groin and hip area. This is worse if she walks, and the pain tends to come and go. It may occur about 5 times a week and can last about 30 minutes at a time. It feels better if she massages the area. Also for the past months she's had pain in her right lower back around the SI area. This tends to come on at the same time as the pain in the left hip area. The past week and a half she's had some URI symptoms with cough productive of green sputum, wheezing, and sore throat. She denies any fever, nausea, vomiting, or urinary symptoms.  Review of Systems:  Other than noted above, the patient denies any of the following symptoms: Systemic:  No fever, chills, sweats, or weight loss. GI:  No abdominal pain, nausea, anorexia, vomiting, diarrhea, constipation, melena or hematochezia. GU:  No dysuria, frequency, urgency, hematuria, vaginal discharge, itching, or abnormal vaginal bleeding. Skin:  No rash or itching.  PMFSH:  Past medical history, family history, social history, meds, and allergies were reviewed. She has a [employer] fast streamer.  Physical Exam:   Vital signs:  BP 126/86  Pulse 84  Temp(Src) 98.3 F (36.8 C) (Oral)  Resp 16  SpO2 98%  LMP 03/24/2013 General:  Alert, oriented and in no distress. ENT: The pharynx is red with cobblestoning. Lungs:  Breath sounds clear and equal bilaterally.  No wheezes, rales or rhonchi. Heart:  Regular rhythm.  No gallops or murmers. Abdomen:  Soft, flat and non-distended.  No organomegaly or mass.  No tenderness, guarding or rebound.  Bowel sounds normally active. Pelvic exam:  Normal external genitalia. There was a scant amount of whitish discharge which was mildly malodorous. Her IUD  string was in place. There was no cervical motion tenderness. Uterus was normal in size and shape and nontender. No adnexal tenderness or mass. Back: She has tenderness to palpation over the SI areas bilaterally. Straight leg raising is negative. Extremities: There is tenderness to palpation over the lateral aspect of the left hip, the left groin, and left buttock. The hip has a full range of motion with no pain on flexion. Faber maneuver was positive. Fadir maneuver was also positive but less so. Skin:  Clear, warm and dry.  Labs:   Results for orders placed during the hospital encounter of 03/27/13  POCT URINALYSIS DIP (DEVICE)      Result Value Range   Glucose, UA NEGATIVE  NEGATIVE mg/dL   Bilirubin Urine NEGATIVE  NEGATIVE   Ketones, ur NEGATIVE  NEGATIVE mg/dL   Specific Gravity, Urine 1.020  1.005 - 1.030   Hgb urine dipstick MODERATE (*) NEGATIVE   pH 6.0  5.0 - 8.0   Protein, ur NEGATIVE  NEGATIVE mg/dL   Urobilinogen, UA 0.2  0.0 - 1.0 mg/dL   Nitrite NEGATIVE  NEGATIVE   Leukocytes, UA TRACE (*) NEGATIVE  POCT PREGNANCY, URINE      Result Value Range   Preg Test, Ur NEGATIVE  NEGATIVE    Assessment:  The primary encounter diagnosis was Vaginitis. Diagnoses of Acute sinusitis and Sacro ilial pain were also pertinent to this visit.  The cause for the hip pain  may be sacroiliac arthritis, tendinitis, bursitis, or arthritis of the hip itself. She will need a followup with orthopedics.  Plan:   1.  Meds:  The following meds were prescribed:   Discharge Medication List as of 03/27/2013  3:04 PM    START taking these medications   Details  amoxicillin-clavulanate (AUGMENTIN) 875-125 MG per tablet Take 1 tablet by mouth 2 (two) times daily., Starting 03/27/2013, Until Discontinued, Normal    !! fluconazole (DIFLUCAN) 150 MG tablet Take 1 tablet (150 mg total) by mouth once., Starting 03/27/2013, Normal    !! meloxicam (MOBIC) 15 MG tablet Take 1 tablet (15 mg total) by mouth daily.,  Starting 03/27/2013, Until Discontinued, Normal    !! metroNIDAZOLE (FLAGYL) 500 MG tablet Take 1 tablet (500 mg total) by mouth 2 (two) times daily., Starting 03/27/2013, Until Discontinued, Normal    traMADol (ULTRAM) 50 MG tablet Take 2 tablets (100 mg total) by mouth every 8 (eight) hours as needed., Starting 03/27/2013, Until Discontinued, Normal     !! - Potential duplicate medications found. Please discuss with provider.      2.  Patient Education/Counseling:  The patient was given appropriate handouts, self care instructions, and instructed in symptomatic relief.  3.  Follow up:  The patient was told to follow up if no better in 3 to 4 days, if becoming worse in any way, and given some red flag symptoms such as worsening pain which would prompt immediate return.  Follow up with Dr. Marcene CorningPeter Dalldorf.     Reuben Likesavid C Betzayda Braxton, MD 03/27/13 2219

## 2013-03-27 NOTE — ED Notes (Signed)
Pt c/o vag itching/irritation onset 3 days w/a slight odor to it... Reports she has been using a new body soap Denies abn vag d/c, urinary sxs, abd pain Alert w/no signs of acute distress.
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
## 2013-03-27 NOTE — Discharge Instructions (Signed)
Do exercises twice daily followed by moist heat for 15 minutes. ° ° ° ° ° °Try to be as active as possible. ° °If no better in 2 weeks, follow up with orthopedist. ° ° °

## 2013-03-29 NOTE — ED Notes (Signed)
GC/Chlamydia neg., Affirm: Candida and Trich neg., Gardnerella pos.  Pt. adequately treated with Flagyl. Vassie MoselleYork, Reganne Messerschmidt M 03/29/2013

## 2014-01-20 ENCOUNTER — Encounter (HOSPITAL_COMMUNITY): Payer: Self-pay | Admitting: Emergency Medicine

## 2014-11-02 ENCOUNTER — Encounter (HOSPITAL_COMMUNITY): Payer: Self-pay | Admitting: Oncology

## 2014-11-02 ENCOUNTER — Emergency Department (HOSPITAL_COMMUNITY)
Admission: EM | Admit: 2014-11-02 | Discharge: 2014-11-02 | Disposition: A | Discharge status: Home / Self Care | Payer: Medicaid Other | Attending: Emergency Medicine | Admitting: Emergency Medicine

## 2014-11-02 DIAGNOSIS — K088 Other specified disorders of teeth and supporting structures: Secondary | ICD-10-CM | POA: Diagnosis present

## 2014-11-02 DIAGNOSIS — K029 Dental caries, unspecified: Secondary | ICD-10-CM | POA: Insufficient documentation

## 2014-11-02 MED ORDER — TRAMADOL HCL 50 MG PO TABS: 100.0000 mg | ORAL_TABLET | Freq: Three times a day (TID) | ORAL | Status: DC | PRN

## 2014-11-02 MED ORDER — TRAMADOL HCL 50 MG PO TABS
50.0000 mg | ORAL_TABLET | Freq: Once | ORAL | Status: AC
  Administered 2014-11-02: 50 mg via ORAL
  Filled 2014-11-02: qty 1

## 2014-11-02 NOTE — Discharge Instructions (Signed)
Dental Caries Dental caries is tooth decay. This decay can cause a hole in teeth (cavity) that can get bigger and deeper over time. HOME CARE  Brush and floss your teeth. Do this at least two times a day.  Use a fluoride toothpaste.  Use a mouth rinse if told by your dentist or doctor.  Eat less sugary and starchy foods. Drink less sugary drinks.  Avoid snacking often on sugary and starchy foods. Avoid sipping often on sugary drinks.  Keep regular checkups and cleanings with your dentist.  Use fluoride supplements if told by your dentist or doctor.  Allow fluoride to be applied to teeth if told by your dentist or doctor. Document Released: 12/15/2007 Document Revised: 07/22/2013 Document Reviewed: 03/09/2012 [facility] Patient Information 2015 [place], [place]. This information is not intended to replace advice given to you by your health care provider. Make sure you discuss any questions you have with your health care provider. As discussed please take a small amount of dental wax that you have purchased at the pharmacy, rub, a small piece pea-sized between your fingers until it is warm and pliable then placed into the cavity that has been formed.  The broken tooth.  This can stay in place for several hours to several days.  She can replace as needed.  Please make sure to make an appointment with your dentist on Monday for definitive treatment

## 2014-11-02 NOTE — ED Notes (Signed)
Per pt she was eating cookies and broke one of her top left molars.  Pt reports that she thought the pain would get better however it got worse.

## 2014-11-03 ENCOUNTER — Inpatient Hospital Stay (EMERGENCY_DEPARTMENT_HOSPITAL)
Admission: AD | Admit: 2014-11-03 | Discharge: 2014-11-03 | Disposition: A | Discharge status: Home / Self Care | Payer: Medicaid Other | Source: Ambulatory Visit | Attending: Family Medicine | Admitting: Family Medicine

## 2014-11-03 ENCOUNTER — Emergency Department (HOSPITAL_COMMUNITY)
Admission: EM | Admit: 2014-11-03 | Discharge: 2014-11-03 | Discharge status: Against Medical Advice | Payer: Medicaid Other | Attending: Emergency Medicine | Admitting: Emergency Medicine

## 2014-11-03 ENCOUNTER — Encounter (HOSPITAL_COMMUNITY): Payer: Self-pay | Admitting: *Deleted

## 2014-11-03 ENCOUNTER — Encounter (HOSPITAL_COMMUNITY): Payer: Self-pay

## 2014-11-03 DIAGNOSIS — R51 Headache: Secondary | ICD-10-CM | POA: Insufficient documentation

## 2014-11-03 DIAGNOSIS — R1110 Vomiting, unspecified: Secondary | ICD-10-CM | POA: Insufficient documentation

## 2014-11-03 DIAGNOSIS — R109 Unspecified abdominal pain: Secondary | ICD-10-CM | POA: Insufficient documentation

## 2014-11-03 DIAGNOSIS — K029 Dental caries, unspecified: Secondary | ICD-10-CM | POA: Diagnosis not present

## 2014-11-03 LAB — COMPREHENSIVE METABOLIC PANEL
ALT: 14 U/L (ref 14–54)
AST: 18 U/L (ref 15–41)
Albumin: 4 g/dL (ref 3.5–5.0)
Alkaline Phosphatase: 46 U/L (ref 38–126)
Anion gap: 10 (ref 5–15)
BUN: 11 mg/dL (ref 6–20)
CO2: 23 mmol/L (ref 22–32)
CREATININE: 0.69 mg/dL (ref 0.44–1.00)
Calcium: 9.3 mg/dL (ref 8.9–10.3)
Chloride: 106 mmol/L (ref 101–111)
GFR calc non Af Amer: >60 mL/min (ref >60)
Glucose, Bld: 88 mg/dL (ref 65–99)
Potassium: 3.1 mmol/L — ABNORMAL LOW (ref 3.5–5.1)
Sodium: 139 mmol/L (ref 135–145)
Total Bilirubin: 0.5 mg/dL (ref 0.3–1.2)
Total Protein: 7.5 g/dL (ref 6.5–8.1)

## 2014-11-03 LAB — LIPASE, BLOOD: Lipase: 45 U/L (ref 22–51)

## 2014-11-03 LAB — CBC
HCT: 36.5 % (ref 36.0–46.0)
Hemoglobin: 12.6 g/dL (ref 12.0–15.0)
MCH: 31.6 pg (ref 26.0–34.0)
MCHC: 34.5 g/dL (ref 30.0–36.0)
MCV: 91.5 fL (ref 78.0–100.0)
PLATELETS: 255 10*3/uL (ref 150–400)
RBC: 3.99 MIL/uL (ref 3.87–5.11)
RDW: 12.8 % (ref 11.5–15.5)
WBC: 7 10*3/uL (ref 4.0–10.5)

## 2014-11-03 MED ORDER — KETOROLAC TROMETHAMINE 10 MG PO TABS: 10.0000 mg | ORAL_TABLET | Freq: Four times a day (QID) | ORAL | Status: DC | PRN

## 2014-11-03 MED ORDER — KETOROLAC TROMETHAMINE 60 MG/2ML IM SOLN
60.0000 mg | Freq: Once | INTRAMUSCULAR | Status: AC
  Administered 2014-11-03: 60 mg via INTRAMUSCULAR
  Filled 2014-11-03: qty 2

## 2014-11-03 MED ORDER — ONDANSETRON 8 MG PO TBDP: 8.0000 mg | ORAL_TABLET | Freq: Three times a day (TID) | ORAL | Status: DC | PRN

## 2014-11-03 MED ORDER — ONDANSETRON 8 MG PO TBDP
8.0000 mg | ORAL_TABLET | Freq: Once | ORAL | Status: AC
  Administered 2014-11-03: 8 mg via ORAL
  Filled 2014-11-03: qty 1

## 2014-11-03 MED ORDER — ONDANSETRON 4 MG PO TBDP
4.0000 mg | ORAL_TABLET | Freq: Once | ORAL | Status: AC
  Administered 2014-11-03: 4 mg via ORAL
  Filled 2014-11-03: qty 1

## 2014-11-03 NOTE — ED Notes (Signed)
Pt was here yesterday for dental pain.  Pt states took at 10 am and 3pm.  Since early this morning she has been having abdominal pain with vomiting.  Headache.  Pt took meds on empty stomach.

## 2014-11-03 NOTE — Discharge Instructions (Signed)
Take all pain medicine with food.  Dental Pain A tooth ache may be caused by cavities (tooth decay). Cavities expose the nerve of the tooth to air and hot or cold temperatures. It may come from an infection or abscess (also called a boil or furuncle) around your tooth. It is also often caused by dental caries (tooth decay). This causes the pain you are having. DIAGNOSIS  Your caregiver can diagnose this problem by exam. TREATMENT   If caused by an infection, it may be treated with medications which kill germs (antibiotics) and pain medications as prescribed by your caregiver. Take medications as directed.  Only take over-the-counter or prescription medicines for pain, discomfort, or fever as directed by your caregiver.  Whether the tooth ache today is caused by infection or dental disease, you should see your dentist as soon as possible for further care. SEEK MEDICAL CARE IF: The exam and treatment you received today has been provided on an emergency basis only. This is not a substitute for complete medical or dental care. If your problem worsens or new problems (symptoms) appear, and you are unable to meet with your dentist, call or return to this location. SEEK IMMEDIATE MEDICAL CARE IF:   You have a fever.  You develop redness and swelling of your face, jaw, or neck.  You are unable to open your mouth.  You have severe pain uncontrolled by pain medicine. MAKE SURE YOU:   Understand these instructions.  Will watch your condition.  Will get help right away if you are not doing well or get worse. Document Released: 03/07/2005 Document Revised: 05/30/2011 Document Reviewed: 10/24/2007 [employer] Patient Information 2015 [place], [place]. This information is not intended to replace advice given to you by your health care provider. Make sure you discuss any questions you have with your health care provider.

## 2014-11-03 NOTE — MAU Note (Signed)
Pt was being seen at [facility] but walked out because it was taking too long. Pt came into MAU screaming stating she was having a reaction to Tramadol taken at 1500 and needed somethiing for tooth pain and stomach pain.

## 2014-11-03 NOTE — [facility] Provider Note (Signed)
Chief Complaint: Dental Pain   First Provider Initiated Contact with Patient 11/03/14 2148     SUBJECTIVE HPI: Kristie Sandoval is a 29 y.o. G2P1011 non-pregnant female who presents to [facility] reporting severe tooth pain and N/V, abd pain from taking tramadol which she was given for tooth pain at ED visit yesterday. Went to [facility] ED for tooth pain prior to coming to [facility], but left because the wait was too long. Had blood drawn, but no other eval or Tx. Has not been to dentist for this problem.   Pt screaming very loudly upon arrival. Reported tooth and abd pain to RN, but told CNM that abd did not hurt, only felt like she needed to vomit. Felt better after vomiting.   Location:left molars Quality: throbbing Severity: 10/10 on pain scale Duration: 2 days Context: After breaking molar Timing: constant Modifying factors: worse w/ eating Associated signs and symptoms: Neg for fever, chills, facial swelling, drainage from gums. Pos for N/V, abd pain--possibly from pain medicine.  Past Medical History  Diagnosis Date  . Trichomonas   . Headache(784.0)     no hx prior to preg  . GERD (gastroesophageal reflux disease)   . Gonorrhea, current pregnancy 12/12    Test of cure negative in April  . Chlamydia infection, current pregnancy 12/12    Test of cure negative in April   OB History  Gravida Para Term Preterm AB SAB TAB Ectopic Multiple Living  0 1 1 0 0 0 1    # Outcome Date GA Lbr Len/2nd Weight Sex Delivery Anes PTL Lv  2 Term 10/26/11 [redacted]w[redacted]d 15:36 / 00:42 6 lb 5 oz (2.863 kg) F Vag-Spont EPI  Y     Comments: WNL   1 SAB              Past Surgical History  Procedure Laterality Date  . No past surgeries     Social History   Social History  . Marital Status: Single    Spouse Name: N/A  . Number of Children: N/A  . Years of Education: N/A   Occupational History  . Not on file.   Social History Main Topics  . Smoking status: Former Smoker -- 0.00  packs/day    Types: Cigarettes    Quit date: 02/18/2011  . Smokeless tobacco: Never Used  . Alcohol Use: No  . Drug Use: No     Comment: no longer  . Sexual Activity: Yes    Birth Control/ Protection: Implant   Other Topics Concern  . Not on file   Social History Narrative   No current facility-administered medications on file prior to encounter.   Current Outpatient Prescriptions on File Prior to Encounter  Medication Sig Dispense Refill  . acetaminophen (TYLENOL) 500 MG tablet Take 500-1,000 mg by mouth every 6 (six) hours as needed. For pain    . levonorgestrel (MIRENA) 20 MCG/24HR IUD 1 each by Intrauterine route continuous. Started 2013.    . traMADol (ULTRAM) 50 MG tablet Take 2 tablets (100 mg total) by mouth every 8 (eight) hours as needed. (Patient taking differently: Take 100 mg by mouth every 8 (eight) hours as needed for moderate pain (dental pain). ) 30 tablet 0   No Known Allergies  I have reviewed the past Medical Hx, Surgical Hx, Social Hx, Allergies and Medications.   Review of Systems  Constitutional: Negative for fever and chills.  HENT: Positive for dental problem. Negative for facial  swelling, mouth sores and trouble swallowing.   Gastrointestinal: Positive for nausea and vomiting. Negative for abdominal pain, diarrhea and constipation.    OBJECTIVE Patient Vitals for the past 24 hrs:  BP Temp Temp src Pulse Resp  11/03/14 2130 (!) 105/46 mmHg 97.8 F (36.6 C) Oral (!) 49 18   Constitutional: Well-developed, well-nourished female in severe distress.  Cardiovascular: mild bradycardia.  Respiratory: normal rate and effort. No SOB.  GI: Abd soft, non-tender. Pos BS x 4 Mouth: Broken upper left molar. No swelling of gums or drainage. No facial swelling or tenderness.  Neurologic: Alert and oriented x 4.   LAB RESULTS Results for orders placed or performed during the hospital encounter of 11/03/14 (from the past 24 hour(s))  Lipase, blood     Status:  None   Collection Time: 11/03/14  7:25 PM  Result Value Ref Range   Lipase 45 22 - 51 U/L  Comprehensive metabolic panel     Status: Abnormal   Collection Time: 11/03/14  7:25 PM  Result Value Ref Range   Sodium 139 135 - 145 mmol/L   Potassium 3.1 (L) 3.5 - 5.1 mmol/L   Chloride 106 101 - 111 mmol/L   CO2 23 22 - 32 mmol/L   Glucose, Bld 88 65 - 99 mg/dL   BUN 11 6 - 20 mg/dL   Creatinine, Ser 1.61 0.44 - 1.00 mg/dL   Calcium 9.3 8.9 - 09.6 mg/dL   Total Protein 7.5 6.5 - 8.1 g/dL   Albumin 4.0 3.5 - 5.0 g/dL   AST 18 15 - 41 U/L   ALT 14 14 - 54 U/L   Alkaline Phosphatase 46 38 - 126 U/L   Total Bilirubin 0.5 0.3 - 1.2 mg/dL   GFR calc non Af Amer >60 >60 mL/min   GFR calc Af Amer >60 >60 mL/min   Anion gap 10 5 - 15  CBC     Status: None   Collection Time: 11/03/14  7:25 PM  Result Value Ref Range   WBC 7.0 4.0 - 10.5 K/uL   RBC 3.99 3.87 - 5.11 MIL/uL   Hemoglobin 12.6 12.0 - 15.0 g/dL   HCT 04.5 40.9 - 81.1 %   MCV 91.5 78.0 - 100.0 fL   MCH 31.6 26.0 - 34.0 pg   MCHC 34.5 30.0 - 36.0 g/dL   RDW 91.4 78.2 - 95.6 %   Platelets 255 150 - 400 K/uL    IMAGING No results found.  MAU COURSE Toradol, Zofran given. Vomited once in MAU.  Feeling much better. Pain resolved.  MDM 60 year non-pregnant female w/ dental pain. Pt informed that [facility]U specializes in Ob/Gyn. No obvious dental abscess or emergent condition present, but offered eval at ED. Strongly encouraged pt to ultimately F/U w/ dentist.   ASSESSMENT 1. Pain due to dental caries     PLAN Discharge home in stable condition. F/U at ED for facial swelling, worsening pain, fever, chills.      Follow-up Information    Follow up with Dentist.   Why:  As soon as possible      Follow up with [facility].   Why:  As needed in emergencies   Contact information:   [address] [place] [address]        Medication List    STOP taking these medications        traMADol 50 MG  tablet  Commonly known as:  [name]  TAKE these medications        acetaminophen 500 MG tablet  Commonly known as:  TYLENOL  Take 500-1,000 mg by mouth every 6 (six) hours as needed. For pain     ketorolac 10 MG tablet  Commonly known as:  TORADOL  Take 1 tablet (10 mg total) by mouth every 6 (six) hours as needed. Take with food.     levonorgestrel 20 MCG/24HR IUD  Commonly known as:  MIRENA  1 each by Intrauterine route continuous. Started 2013.     ondansetron 8 MG disintegrating tablet  Commonly known as:  ZOFRAN ODT  Take 1 tablet (8 mg total) by mouth every 8 (eight) hours as needed for nausea or vomiting.     ranitidine 150 MG capsule  Commonly known as:  ZANTAC  Take 150 mg by mouth daily as needed for heartburn.       [place], CNM 11/03/2014  11:08 PM

## 2014-11-09 ENCOUNTER — Encounter (HOSPITAL_COMMUNITY): Payer: Self-pay | Admitting: Emergency Medicine

## 2014-11-09 ENCOUNTER — Emergency Department (HOSPITAL_COMMUNITY)
Admission: EM | Admit: 2014-11-09 | Discharge: 2014-11-09 | Disposition: A | Discharge status: Home / Self Care | Payer: Medicaid Other | Attending: Emergency Medicine | Admitting: Emergency Medicine

## 2014-11-09 DIAGNOSIS — Z87891 Personal history of nicotine dependence: Secondary | ICD-10-CM | POA: Diagnosis not present

## 2014-11-09 DIAGNOSIS — Z8619 Personal history of other infectious and parasitic diseases: Secondary | ICD-10-CM | POA: Diagnosis not present

## 2014-11-09 DIAGNOSIS — K219 Gastro-esophageal reflux disease without esophagitis: Secondary | ICD-10-CM | POA: Insufficient documentation

## 2014-11-09 DIAGNOSIS — K088 Other specified disorders of teeth and supporting structures: Secondary | ICD-10-CM | POA: Diagnosis present

## 2014-11-09 DIAGNOSIS — K029 Dental caries, unspecified: Secondary | ICD-10-CM | POA: Diagnosis not present

## 2014-11-09 MED ORDER — KETOROLAC TROMETHAMINE 30 MG/ML IJ SOLN
30.0000 mg | Freq: Once | INTRAMUSCULAR | Status: AC
  Administered 2014-11-09: 30 mg via INTRAMUSCULAR
  Filled 2014-11-09: qty 1

## 2014-11-09 MED ORDER — KETOROLAC TROMETHAMINE 10 MG PO TABS: 10.0000 mg | ORAL_TABLET | Freq: Four times a day (QID) | ORAL | Status: DC | PRN

## 2014-11-09 NOTE — ED Notes (Signed)
Pt alert, oriented, and ambulatory upon DC. She was advised to follow with dentistry.

## 2014-11-09 NOTE — Discharge Instructions (Signed)
Dental Caries Dental caries is tooth decay. This decay can cause a hole in teeth (cavity) that can get bigger and deeper over time. HOME CARE  Brush and floss your teeth. Do this at least two times a day.  Use a fluoride toothpaste.  Use a mouth rinse if told by your dentist or doctor.  Eat less sugary and starchy foods. Drink less sugary drinks.  Avoid snacking often on sugary and starchy foods. Avoid sipping often on sugary drinks.  Keep regular checkups and cleanings with your dentist.  Use fluoride supplements if told by your dentist or doctor.  Allow fluoride to be applied to teeth if told by your dentist or doctor. Document Released: 12/15/2007 Document Revised: 07/22/2013 Document Reviewed: 03/09/2012 [facility] Patient Information 2015 [place], [place]. This information is not intended to replace advice given to you by your health care provider. Make sure you discuss any questions you have with your health care provider. Please call Dr. Geannie Risen office on Monday tell them you referred to the emergency department.  They will make every effort to get to seen within 24 hours of your home

## 2014-11-09 NOTE — ED Notes (Signed)
Pt requesting shot of Toradol and possible script for same

## 2014-11-09 NOTE — ED Provider Notes (Cosign Needed Addendum)
CSN: [id number]     Arrival date & time 11/02/14  0216 History   First MD Initiated Contact with Patient 11/02/14 0249     Chief Complaint  Patient presents with  . Dental Pain     (Consider location/radiation/quality/duration/timing/severity/associated sxs/prior Treatment) HPI Comments: Cavity pain   The history is provided by the patient.    Past Medical History  Diagnosis Date  . Trichomonas   . Headache(784.0)     no hx prior to preg  . GERD (gastroesophageal reflux disease)   . Gonorrhea, current pregnancy 12/12    Test of cure negative in April  . Chlamydia infection, current pregnancy 12/12    Test of cure negative in April   Past Surgical History  Procedure Laterality Date  . No past surgeries     Family History  Problem Relation Age of Onset  . Anesthesia problems Neg Hx   . Hypotension Neg Hx   . Malignant hyperthermia Neg Hx   . Pseudochol deficiency Neg Hx   . Other Neg Hx   . Diabetes Maternal Grandmother    Social History  Substance Use Topics  . Smoking status: Former Smoker -- 0.00 packs/day    Types: Cigarettes    Quit date: 02/18/2011  . Smokeless tobacco: Never Used  . Alcohol Use: No   OB History    Gravida Para Term Preterm AB TAB SAB Ectopic Multiple Living   2 1 1  0 1 0 1 0 0 1     Review of Systems  HENT: Positive for dental problem.   Eyes: Negative.   Respiratory: Negative.   Cardiovascular: Negative.   Gastrointestinal: Negative.   Genitourinary: Negative.   Allergic/Immunologic: Negative.   Neurological: Negative.   Hematological: Negative.   Psychiatric/Behavioral: Negative.   All other systems reviewed and are negative.     Allergies  Review of patient's allergies indicates no known allergies.  Home Medications   Prior to Admission medications   Medication Sig Start Date End Date Taking? Authorizing Provider  acetaminophen (TYLENOL) 500 MG tablet Take 500-1,000 mg by mouth every 6 (six) hours as needed. For pain     Historical Provider, MD  ketorolac (TORADOL) 10 MG tablet Take 1 tablet (10 mg total) by mouth every 6 (six) hours as needed. Take with food. 11/09/14   Earley Favor, NP  levonorgestrel (MIRENA) 20 MCG/24HR IUD 1 each by Intrauterine route continuous. Started 2013.    Historical Provider, MD  ondansetron (ZOFRAN ODT) 8 MG disintegrating tablet Take 1 tablet (8 mg total) by mouth every 8 (eight) hours as needed for nausea or vomiting. 11/03/14   Dorathy Kinsman, CNM  ranitidine (ZANTAC) 150 MG capsule Take 150 mg by mouth daily as needed for heartburn.    Historical Provider, MD   BP 143/88 mmHg  Pulse 72  Temp(Src) 98.2 F (36.8 C) (Oral)  Resp 18  Ht 5\' 4"  (1.626 m)  Wt 155 lb (70.308 kg)  BMI 26.59 kg/m2  SpO2 100% Physical Exam  Constitutional: She appears well-developed and well-nourished.  HENT:  Large cavity  Eyes: Pupils are equal, round, and reactive to light.  Neck: Normal range of motion.  Nursing note and vitals reviewed.   ED Course  Procedures (including critical care time) Labs Review Labs Reviewed - No data to display  Imaging Review No results found. I have personally reviewed and evaluated these images and lab results as part of my medical decision-making.   EKG Interpretation None  MDM   Final diagnoses:  Dental caries         Earley Favor, NP 11/09/14 1610  Marisa Severin, MD 11/09/14 9604  Earley Favor, NP 12/27/14 2020

## 2014-11-09 NOTE — ED Provider Notes (Signed)
CSN: [id number]     Arrival date & time 11/09/14  0455 History   First MD Initiated Contact with Patient 11/09/14 0524     Chief Complaint  Patient presents with  . Dental Pain     (Consider location/radiation/quality/duration/timing/severity/associated sxs/prior Treatment) HPI Comments: Patient with a large dental cavity was seen by this department, given by mouth, Toradol, which she states helps.  She was seen by a general dentist recommends that she be seen by an oral surgeon.  Unfortunately, there are no oral oral surgery appointments available until October and she presents with dental pain.  She is again requesting Ketoralac to get her through to her dental appointment  Patient is a 29 y.o. female presenting with tooth pain. The history is provided by the patient.  Dental Pain Location:  Upper Upper teeth location:  15/LU 2nd molar Quality:  Aching Severity:  Moderate Onset quality:  Gradual Timing:  Constant Progression:  Worsening Chronicity:  Recurrent Context: dental caries   Relieved by: ketoralac  Worsened by:  Touching Associated symptoms: no difficulty swallowing, no facial pain, no facial swelling, no fever and no gum swelling     Past Medical History  Diagnosis Date  . Trichomonas   . Headache(784.0)     no hx prior to preg  . GERD (gastroesophageal reflux disease)   . Gonorrhea, current pregnancy 12/12    Test of cure negative in April  . Chlamydia infection, current pregnancy 12/12    Test of cure negative in April   Past Surgical History  Procedure Laterality Date  . No past surgeries     Family History  Problem Relation Age of Onset  . Anesthesia problems Neg Hx   . Hypotension Neg Hx   . Malignant hyperthermia Neg Hx   . Pseudochol deficiency Neg Hx   . Other Neg Hx   . Diabetes Maternal Grandmother    Social History  Substance Use Topics  . Smoking status: Former Smoker -- 0.00 packs/day    Types: Cigarettes    Quit date: 02/18/2011  .  Smokeless tobacco: Never Used  . Alcohol Use: No   OB History    Gravida Para Term Preterm AB TAB SAB Ectopic Multiple Living   0 1 0 1 0 0 1     Review of Systems  Constitutional: Negative for fever and chills.  HENT: Positive for dental problem. Negative for facial swelling and trouble swallowing.   All other systems reviewed and are negative.     Allergies  Review of patient's allergies indicates no known allergies.  Home Medications   Prior to Admission medications   Medication Sig Start Date End Date Taking? Authorizing Provider  acetaminophen (TYLENOL) 500 MG tablet Take 500-1,000 mg by mouth every 6 (six) hours as needed. For pain    Historical Provider, MD  ketorolac (TORADOL) 10 MG tablet Take 1 tablet (10 mg total) by mouth every 6 (six) hours as needed. Take with food. 11/09/14   Earley Favor, NP  levonorgestrel (MIRENA) 20 MCG/24HR IUD 1 each by Intrauterine route continuous. Started 2013.    Historical Provider, MD  ondansetron (ZOFRAN ODT) 8 MG disintegrating tablet Take 1 tablet (8 mg total) by mouth every 8 (eight) hours as needed for nausea or vomiting. 11/03/14   Dorathy Kinsman, CNM  ranitidine (ZANTAC) 150 MG capsule Take 150 mg by mouth daily as needed for heartburn.    Historical Provider, MD   BP 125/75 mmHg  Pulse 53  Temp(Src) 97.9 F (36.6 C) (Oral)  Resp 18  Ht 5\' 5"  (1.651 m)  Wt 155 lb (70.308 kg)  BMI 25.79 kg/m2  SpO2 100% Physical Exam  Constitutional: She appears well-developed and well-nourished.  HENT:  Head: Normocephalic.  Mouth/Throat:    Eyes: Pupils are equal, round, and reactive to light.  Neck: Normal range of motion.  Cardiovascular: Normal rate and regular rhythm.   Pulmonary/Chest: Effort normal.  Musculoskeletal: Normal range of motion.  Lymphadenopathy:    She has no cervical adenopathy.  Neurological: She is alert.  Skin: Skin is warm.  Nursing note and vitals reviewed.   ED Course  Procedures (including  critical care time) Labs Review Labs Reviewed - No data to display  Imaging Review No results found. I have personally reviewed and evaluated these images and lab results as part of my medical decision-making.   EKG Interpretation None      MDM   Final diagnoses:  Dental cavity         Earley Favor, NP 11/09/14 1610  Marisa Severin, MD 11/09/14 [phone]

## 2014-11-09 NOTE — ED Notes (Signed)
Pt c/o L upper dental pain, pt states she has been seen for this by dentist but is unable to get in to oral surgeon for extraction.

## 2015-09-20 ENCOUNTER — Encounter (HOSPITAL_COMMUNITY): Payer: Self-pay | Admitting: Emergency Medicine

## 2015-09-20 ENCOUNTER — Emergency Department (HOSPITAL_COMMUNITY): Payer: Medicaid Other

## 2015-09-20 ENCOUNTER — Emergency Department (HOSPITAL_COMMUNITY)
Admission: EM | Admit: 2015-09-20 | Discharge: 2015-09-20 | Disposition: A | Discharge status: Home / Self Care | Payer: Medicaid Other | Attending: Emergency Medicine | Admitting: Emergency Medicine

## 2015-09-20 DIAGNOSIS — Y999 Unspecified external cause status: Secondary | ICD-10-CM | POA: Insufficient documentation

## 2015-09-20 DIAGNOSIS — Y9344 Activity, trampolining: Secondary | ICD-10-CM | POA: Diagnosis not present

## 2015-09-20 DIAGNOSIS — S8992XA Unspecified injury of left lower leg, initial encounter: Secondary | ICD-10-CM | POA: Diagnosis present

## 2015-09-20 DIAGNOSIS — M25562 Pain in left knee: Secondary | ICD-10-CM

## 2015-09-20 DIAGNOSIS — S86892A Other injury of other muscle(s) and tendon(s) at lower leg level, left leg, initial encounter: Secondary | ICD-10-CM

## 2015-09-20 DIAGNOSIS — X503XXA Overexertion from repetitive movements, initial encounter: Secondary | ICD-10-CM | POA: Diagnosis not present

## 2015-09-20 DIAGNOSIS — S86802A Unspecified injury of other muscle(s) and tendon(s) at lower leg level, left leg, initial encounter: Secondary | ICD-10-CM | POA: Diagnosis not present

## 2015-09-20 DIAGNOSIS — Y9289 Other specified places as the place of occurrence of the external cause: Secondary | ICD-10-CM | POA: Insufficient documentation

## 2015-09-20 DIAGNOSIS — Z87891 Personal history of nicotine dependence: Secondary | ICD-10-CM | POA: Diagnosis not present

## 2015-09-20 MED ORDER — ONDANSETRON 4 MG PO TBDP
4.0000 mg | ORAL_TABLET | Freq: Three times a day (TID) | ORAL | Status: DC | PRN
Start: 2015-09-20 — End: 2016-03-23

## 2015-09-20 MED ORDER — IBUPROFEN 800 MG PO TABS
800.0000 mg | ORAL_TABLET | Freq: Once | ORAL | Status: AC
Start: 2015-09-20 — End: 2015-09-20
  Administered 2015-09-20: 800 mg via ORAL
  Filled 2015-09-20: qty 1

## 2015-09-20 NOTE — ED Notes (Signed)
Pt states she was at the trampoline park and was jumping and her left leg went back behind her body Pt is c/o left knee pain  Pt states the pain is worse on the sides of her knee

## 2015-09-20 NOTE — Discharge Instructions (Signed)
You may have a tear in your patellar tendon. Wear knee immobilizer and use crutches to walk, avoid bearing weight on your left leg. Keep leg elevated at rest, ice, and use ibuprofen and tylenol for pain. Call orthopedic surgery for follow-up (listed above).  You also asked about acid reflux medications. You can take OTC prilosec (or omeprazole generic brand).  Tendon Injury Tendons are strong, cordlike structures that connect muscle to bone. Tendons are made up of woven fibers, like a rope. A tendon injury is a tear (rupture) of the tendon. The rupture may be partial (only a few of the fibers in your tendon rupture) or complete (your entire tendon ruptures). CAUSES  Tendon injuries can be caused by high-stress activities, such as sports. They also can be caused by a repetitive injury or by a single injury from an excessive, rapid force. SYMPTOMS  Symptoms of tendon injury include pain when you move the joint close to the tendon. Other symptoms are swelling, redness, and warmth. DIAGNOSIS  Tendon injuries often can be diagnosed by physical exam. However, sometimes an X-ray exam or advanced imaging, such as magnetic resonance imaging (MRI), is necessary to determine the extent of the injury. TREATMENT  Partial tendon ruptures often can be treated with immobilization. A splint, bandage, or removable brace usually is used to immobilize the injured tendon. Most injured tendons need to be immobilized for 1-2 months before they are completely healed. Complete tendon ruptures may require surgical reattachment.   This information is not intended to replace advice given to you by your health care provider. Make sure you discuss any questions you have with your health care provider.   Document Released: 04/14/2004 Document Revised: 02/24/2011 Document Reviewed: 05/29/2011 Elsevier Interactive Patient Education [employer]2016 Elsevier Inc.

## 2015-09-20 NOTE — ED Provider Notes (Signed)
CSN: [id number]     Arrival date & time 09/20/15  2200 History   First MD Initiated Contact with Patient 09/20/15 2229     Chief Complaint  Patient presents with  . Knee Pain     (Consider location/radiation/quality/duration/timing/severity/associated sxs/prior Treatment) HPI 30 year old female who presents with left knee pain. She is otherwise healthy. I was at a trampoline park earlier today and around noon states that she landed on her butt but her knee was bent all the way backwards. States that she had pain in her knee, initially I felt that her symptoms would go away. However due to persistent pain and some swelling in her knee came to ED for evaluation. Past Medical History  Diagnosis Date  . Trichomonas   . Headache(784.0)     no hx prior to preg  . GERD (gastroesophageal reflux disease)   . Gonorrhea, current pregnancy 12/12    Test of cure negative in April  . Chlamydia infection, current pregnancy 12/12    Test of cure negative in April   Past Surgical History  Procedure Laterality Date  . No past surgeries     Family History  Problem Relation Age of Onset  . Anesthesia problems Neg Hx   . Hypotension Neg Hx   . Malignant hyperthermia Neg Hx   . Pseudochol deficiency Neg Hx   . Other Neg Hx   . Diabetes Maternal Grandmother    Social History  Substance Use Topics  . Smoking status: Former Smoker -- 0.00 packs/day    Types: Cigarettes    Quit date: 02/18/2011  . Smokeless tobacco: Never Used  . Alcohol Use: No   OB History    Gravida Para Term Preterm AB TAB SAB Ectopic Multiple Living   2 1 1  0 1 0 1 0 0 1     Review of Systems  Constitutional: Negative for fever.  Musculoskeletal: Positive for arthralgias (left knee pain).  Skin: Negative for wound.  Neurological: Negative for weakness and numbness.  Hematological: Does not bruise/bleed easily.  All other systems reviewed and are negative.     Allergies  Review of patient's allergies indicates  no known allergies.  Home Medications   Prior to Admission medications   Medication Sig Start Date End Date Taking? Authorizing Provider  acetaminophen (TYLENOL) 500 MG tablet Take 500-1,000 mg by mouth every 6 (six) hours as needed. For pain    Historical Provider, MD  ketorolac (TORADOL) 10 MG tablet Take 1 tablet (10 mg total) by mouth every 6 (six) hours as needed. Take with food. 11/09/14   Earley FavorGail Schulz, NP  levonorgestrel (MIRENA) 20 MCG/24HR IUD 1 each by Intrauterine route continuous. Started 2013.    Historical Provider, MD  ondansetron (ZOFRAN ODT) 8 MG disintegrating tablet Take 1 tablet (8 mg total) by mouth every 8 (eight) hours as needed for nausea or vomiting. 11/03/14   Dorathy KinsmanVirginia Smith, CNM  ranitidine (ZANTAC) 150 MG capsule Take 150 mg by mouth daily as needed for heartburn.    Historical Provider, MD   BP 117/76 mmHg  Pulse 75  Temp(Src) 98.9 F (37.2 C) (Oral)  Resp 18  Ht 5\' 4"  (1.626 m)  Wt 154 lb (69.854 kg)  BMI 26.42 kg/m2  SpO2 100%  LMP 08/23/2015 Physical Exam Physical Exam  Nursing note and vitals reviewed. Constitutional: Well developed, well nourished, non-toxic, and in no acute distress Head: Normocephalic and atraumatic.  Mouth/Throat: Oropharynx moist Neck: Normal range of motion. Neck supple.  Cardiovascular: +  2 DP pulses bilaterally Pulmonary/Chest: Effort normal Abdominal: Soft. There is no tenderness. There is no rebound and no guarding.  Musculoskeletal: Focused exam of LLE with mild swelling and tenderness along patellar tendon, pain with extension and flexion, no laxity with anterior/posterior drawer testing Neurological: Alert, no facial droop, fluent speech, sensation to light touch in tact in bilateral lower extremities, full strength ankle dorsi/plantarflexion bilaterally Skin: Skin is warm and dry.  Psychiatric: Cooperative  ED Course  Procedures (including critical care time) Labs Review Labs Reviewed - No data to display  Imaging  Review Dg Knee Complete 4 Views Left  09/20/2015  CLINICAL DATA:  Anterior left knee pain. Pain after jumping on a trampoline today and knee gave out. EXAM: LEFT KNEE - COMPLETE 4+ VIEW COMPARISON:  None. FINDINGS: Mild patella Alta. Patellar tendon soft tissue planes appear radiographically intact. Minimal anterior soft tissue edema suspected. No acute fracture. Tibial femoral alignment is maintained. There is no joint effusion. IMPRESSION: Mild patellar [place] and anterior soft tissue edema. Recommend correlation for patellar tendon integrity. No acute fracture. Electronically Signed   By: Rubye OaksMelanie  Ehinger M.D.   On: 09/20/2015 23:05   I have personally reviewed and evaluated these images and lab results as part of my medical decision-making.   EKG Interpretation None      MDM   Final diagnoses:  None    Presenting after hyperflexion injury of the left knee. She is well-appearing in no acute distress. Left lower extremity is neurovascularly intact. Swelling and tenderness along the patellar tendon. Pain over this area with extension and flexion. X-ray without fracture but there is question of mild patellar all tongue and anterior soft tissue edema. This could suggest patellar tendon tear/injury. Do not suspect for rupture at this time and she does have full extension ability of her knee. Knee immobilizer placed and she is given crutches. Given orthopedic surgery follow-up for reevaluation for potential patellar tendon tear. I discussed strict return follow-up instructions. Discussed supportive care instructions for home. She expressed understanding of all discharge instructions, and felt comfortable with the plan of care    Lavera Guiseana Duo Mycala Warshawsky, MD 09/20/15 2335

## 2016-03-23 ENCOUNTER — Encounter (HOSPITAL_COMMUNITY): Payer: Self-pay | Admitting: *Deleted

## 2016-03-23 ENCOUNTER — Inpatient Hospital Stay (HOSPITAL_COMMUNITY)
Admission: AD | Admit: 2016-03-23 | Discharge: 2016-03-23 | Disposition: A | Discharge status: Home / Self Care | Payer: Medicaid Other | Source: Ambulatory Visit | Attending: Family Medicine | Admitting: Family Medicine

## 2016-03-23 DIAGNOSIS — K1370 Unspecified lesions of oral mucosa: Secondary | ICD-10-CM | POA: Insufficient documentation

## 2016-03-23 DIAGNOSIS — F17210 Nicotine dependence, cigarettes, uncomplicated: Secondary | ICD-10-CM | POA: Diagnosis not present

## 2016-03-23 DIAGNOSIS — K219 Gastro-esophageal reflux disease without esophagitis: Secondary | ICD-10-CM | POA: Diagnosis not present

## 2016-03-23 DIAGNOSIS — Z79899 Other long term (current) drug therapy: Secondary | ICD-10-CM | POA: Insufficient documentation

## 2016-03-23 LAB — URINALYSIS, ROUTINE W REFLEX MICROSCOPIC
Bacteria, UA: NONE SEEN
GLUCOSE, UA: NEGATIVE mg/dL
KETONES UR: 5 mg/dL — AB
Nitrite: NEGATIVE
PROTEIN: 30 mg/dL — AB
Specific Gravity, Urine: 1.036 — ABNORMAL HIGH (ref 1.005–1.030)
pH: 5 (ref 5.0–8.0)

## 2016-03-23 LAB — POCT PREGNANCY, URINE: Preg Test, Ur: NEGATIVE

## 2016-03-23 NOTE — MAU Provider Note (Signed)
Chief Complaint: Mouth Lesions   First Provider Initiated Contact with Patient 03/23/16 1814      SUBJECTIVE HPI: Kristie Sandoval is a 31 y.o. G2P1011 who presents to maternity admissions reporting a lesion on her lower lip that she is concerned could be a cold sore.  Her daughter recently had a lesion on her lip that she thinks may have been a cold sore.  The pt denies any pain, and indicates that she has an area on the inside of her lower lip that is irritated x 2-3 days. She is so upset and worried about this that she has not eaten in 2 days. She is putting Carmex medicated lip product on her lips frequently since symptoms started. She denies pain. She denies visible raised area/blister. She denies any area of crusting/flaking.  She has many questions about herpes of the mouth vs genital herpes. She is concerned that she could genital herpes because she has a cold sore.  She denies abdominal pain, vaginal bleeding, vaginal itching/burning, urinary symptoms, h/a, dizziness, n/v, or fever/chills.     HPI  Past Medical History:  Diagnosis Date  . Chlamydia infection, current pregnancy 12/12   Test of cure negative in April  . GERD (gastroesophageal reflux disease)   . Gonorrhea, current pregnancy 12/12   Test of cure negative in April  . Headache(784.0)    no hx prior to preg  . Trichomonas    Past Surgical History:  Procedure Laterality Date  . NO PAST SURGERIES     Social History   Social History  . Marital status: Single    Spouse name: N/A  . Number of children: N/A  . Years of education: N/A   Occupational History  . Not on file.   Social History Main Topics  . Smoking status: Current Every Day Smoker    Packs/day: 0.00    Types: Cigarettes    Last attempt to quit: 02/18/2011  . Smokeless tobacco: Current User  . Alcohol use No  . Drug use: No     Comment: no longer  . Sexual activity: Yes    Birth control/ protection: Implant   Other Topics Concern  . Not on  file   Social History Narrative  . No narrative on file   No current facility-administered medications on file prior to encounter.    Current Outpatient Prescriptions on File Prior to Encounter  Medication Sig Dispense Refill  . acetaminophen (TYLENOL) 500 MG tablet Take 500-1,000 mg by mouth every 6 (six) hours as needed. For pain    . ketorolac (TORADOL) 10 MG tablet Take 1 tablet (10 mg total) by mouth every 6 (six) hours as needed. Take with food. (Patient not taking: Reported on 03/23/2016) 40 tablet 0  . levonorgestrel (MIRENA) 20 MCG/24HR IUD 1 each by Intrauterine route continuous. Started 2013.    . ondansetron (ZOFRAN ODT) 4 MG disintegrating tablet Take 1 tablet (4 mg total) by mouth every 8 (eight) hours as needed for nausea or vomiting. (Patient not taking: Reported on 03/23/2016) 20 tablet 0  . ondansetron (ZOFRAN ODT) 8 MG disintegrating tablet Take 1 tablet (8 mg total) by mouth every 8 (eight) hours as needed for nausea or vomiting. (Patient not taking: Reported on 03/23/2016) 20 tablet 1  . ranitidine (ZANTAC) 150 MG capsule Take 150 mg by mouth daily as needed for heartburn.     No Known Allergies  ROS:  Review of Systems  Constitutional: Negative for chills, fatigue and fever.  HENT: Positive for mouth sores.   Respiratory: Negative for shortness of breath.   Cardiovascular: Negative for chest pain.  Genitourinary: Negative for difficulty urinating, dysuria, flank pain, pelvic pain, vaginal bleeding, vaginal discharge and vaginal pain.  Neurological: Negative for dizziness and headaches.  Psychiatric/Behavioral: Negative.      I have reviewed patient's Past Medical Hx, Surgical Hx, Family Hx, Social Hx, medications and allergies.   Physical Exam  Patient Vitals for the past 24 hrs:  BP Temp Temp src Pulse Resp Weight  03/23/16 1729 114/66 98.7 F (37.1 C) Oral 95 16 139 lb (63 kg)   Constitutional: Well-developed, well-nourished female in moderate distress.    Cardiovascular: normal rate Respiratory: normal effort GI: not evaluated MS: Extremities nontender, no edema, normal ROM Neurologic: Alert and oriented x 4.  GU: not evaluated  Physical Exam  Constitutional: She is oriented to person, place, and time. She appears distressed.  HENT:  Head: Normocephalic and atraumatic.  Mouth/Throat: Oropharynx is clear and moist.    Eyes: Conjunctivae and EOM are normal.  Neck: Normal range of motion.  Cardiovascular: Normal rate.   Pulmonary/Chest: Effort normal.  Abdominal: Soft.  Musculoskeletal: Normal range of motion. She exhibits no edema.  Neurological: She is alert and oriented to person, place, and time.  Skin: Skin is warm and dry.  Psychiatric: Mood and affect normal.  Nursing note and vitals reviewed.   LAB RESULTS Results for orders placed or performed during the hospital encounter of 03/23/16 (from the past 24 hour(s))  Urinalysis, Routine w reflex microscopic     Status: Abnormal   Collection Time: 03/23/16  5:30 PM  Result Value Ref Range   Color, Urine AMBER (A) YELLOW   APPearance HAZY (A) CLEAR   Specific Gravity, Urine 1.036 (H) 1.005 - 1.030   pH 5.0 5.0 - 8.0   Glucose, UA NEGATIVE NEGATIVE mg/dL   Hgb urine dipstick LARGE (A) NEGATIVE   Bilirubin Urine SMALL (A) NEGATIVE   Ketones, ur 5 (A) NEGATIVE mg/dL   Protein, ur 30 (A) NEGATIVE mg/dL   Nitrite NEGATIVE NEGATIVE   Leukocytes, UA TRACE (A) NEGATIVE   RBC / HPF 6-30 0 - 5 RBC/hpf   WBC, UA 0-5 0 - 5 WBC/hpf   Bacteria, UA NONE SEEN NONE SEEN   Squamous Epithelial / LPF 0-5 (A) NONE SEEN   Mucous PRESENT   Pregnancy, urine POC     Status: None   Collection Time: 03/23/16  5:46 PM  Result Value Ref Range   Preg Test, Ur NEGATIVE NEGATIVE       IMAGING No results found.  MAU Management/MDM: On physical exam, no clear evidence of HSV lesion.  Small erythemetous area swabbed for HSV and sent to lab.  Provided written and verbal education about HSV,  reassurance provided that although virus is the same (HSV 1 or HSV 2) that cold sores do not indicate a genital infection.  Do not recommend blood testing for HSV because of high numbers of positive results/exposure that do not result in outbreaks.  Recommend pt seek medical care if raised painful, honey-crusted lesion appears.  Recommend pt stop use of medicated lip balm and use moisturizing products instead.  Also recommend pt take her daughter to child's provider when she has an active lesion for confirmation.  Pt states understanding.  Pt stable at time of discharge.  ASSESSMENT 1. Lesion of mouth     PLAN Discharge home  Allergies as of 03/23/2016   No Known  Allergies     Medication List    STOP taking these medications   ketorolac 10 MG tablet Commonly known as:  TORADOL   ondansetron 4 MG disintegrating tablet Commonly known as:  ZOFRAN ODT   ondansetron 8 MG disintegrating tablet Commonly known as:  ZOFRAN ODT     TAKE these medications   acetaminophen 500 MG tablet Commonly known as:  TYLENOL Take 500-1,000 mg by mouth every 6 (six) hours as needed. For pain   levonorgestrel 20 MCG/24HR IUD Commonly known as:  MIRENA 1 each by Intrauterine route continuous. Started 2013.   ranitidine 150 MG capsule Commonly known as:  ZANTAC Take 150 mg by mouth daily as needed for heartburn.      Follow-up Information    [facility] up.   Why:  For emergencies Contact information: [address] [place] [phone]          Sharen CounterLisa Leftwich-Kirby Certified Nurse-Midwife 03/23/2016  7:37 PM

## 2016-03-23 NOTE — Discharge Instructions (Signed)
Cold Sore A cold sore (fever blister) is a skin infection caused by the herpes simplex virus (HSV-1). HSV-1 is closely related to the virus that causes genital herpes (HSV-2), and both viruses can cause oral and genital infections. Cold sores are small, fluid-filled sores inside of the mouth or on the lips, gums, nose, chin, cheeks, or fingers.  The herpes simplex virus can be easily passed (contagious) to other people through close personal contact, such as kissing or sharing personal items. The virus can also spread to other parts of the body, such as the eyes or genitals. Cold sores are contagious until the sores crust over completely. They often heal within 2 weeks.  Once a person is infected, the herpes simplex virus remains permanently in the body. Therefore, there is no cure for cold sores, and they often recur when a person is tired, stressed, sick, or gets too much sun. Additional factors that can cause a recurrence include hormone changes in menstruation or pregnancy, certain drugs, and cold weather.  CAUSES  Cold sores are caused by the herpes simplex virus. The virus is spread from person to person through close contact, such as through kissing, touching the affected area, or sharing personal items such as lip balm, razors, or eating utensils.  SYMPTOMS  The first infection may not cause symptoms. If symptoms develop, the symptoms often go through different stages. Here is how a cold sore develops:   Tingling, itching, or burning is felt 1-2 days before the outbreak.   Fluid-filled blisters appear on the lips, inside the mouth, nose, or on the cheeks.   The blisters start to ooze clear fluid.   The blisters dry up and a yellow crust appears in its place.   The crust falls off.  Symptoms depend on whether it is the initial outbreak or a recurrence. Some other symptoms with the first outbreak may include:   Fever.   Sore throat.   Headache.   Muscle aches.   Swollen  neck glands.  DIAGNOSIS  A diagnosis is often made based on your symptoms and looking at the sores. Sometimes, a sore may be swabbed and then examined in the lab to make a final diagnosis. If the sores are not present, blood tests can find the herpes simplex virus.  TREATMENT  There is no cure for cold sores and no vaccine for the herpes simplex virus. Within 2 weeks, most cold sores go away on their own without treatment. Medicines cannot make the infection go away, but medicine can help relieve some of the pain associated with the sores, can work to stop the virus from multiplying, and can also shorten healing time. Medicine may be in the form of creams, gels, pills, or a shot.  HOME CARE INSTRUCTIONS   Only take over-the-counter or prescription medicines for pain, discomfort, or fever as directed by your caregiver. Do not use aspirin.   Use a cotton-tip swab to apply creams or gels to your sores.   Do not touch the sores or pick the scabs. Wash your hands often. Do not touch your eyes without washing your hands first.   Avoid kissing, oral sex, and sharing personal items until sores heal.   Apply an ice pack on your sores for 10-15 minutes to ease any discomfort.   Avoid hot, cold, or salty foods because they may hurt your mouth. Eat a soft, bland diet to avoid irritating the sores. Use a straw to drink if you have pain when drinking out  of a glass.   Keep sores clean and dry to prevent an infection of other tissues.   Avoid the sun and limit stress if these things trigger outbreaks. If sun causes cold sores, apply sunscreen on the lips before being out in the sun.  SEEK MEDICAL CARE IF:   You have a fever or persistent symptoms for more than 2-3 days.   You have a fever and your symptoms suddenly get worse.   You have pus, not clear fluid, coming from the sores.   You have redness that is spreading.   You have pain or irritation in your eye.   You get sores on your  genitals.   Your sores do not heal within 2 weeks.   You have a weakened immune system.   You have frequent recurrences of cold sores.  MAKE SURE YOU:   Understand these instructions.  Will watch your condition.  Will get help right away if you are not doing well or get worse. This information is not intended to replace advice given to you by your health care provider. Make sure you discuss any questions you have with your health care provider. Document Released: 03/04/2000 Document Revised: 03/28/2014 Document Reviewed: 12/26/2014 Elsevier Interactive Patient Education  2017 [employer].

## 2016-03-23 NOTE — MAU Note (Signed)
89 yr old daughter had a cold sore.  She was kissing on her  About 2.5 wks later she had a cold sore.  She has never had one before.  Used some aloevera, car max and different things.  Pt states feels like there is a cut  On her lip.(Cold sore is not visible to nurse). Is wanting to be tested.

## 2016-03-28 NOTE — MAU Note (Signed)
Lab called to say they had been informed by [facility] that they do not have a HSV culture for this pt.

## 2016-04-04 ENCOUNTER — Inpatient Hospital Stay (HOSPITAL_COMMUNITY)
Admission: AD | Admit: 2016-04-04 | Discharge: 2016-04-04 | Discharge status: Against Medical Advice | Payer: Medicaid Other | Source: Ambulatory Visit | Attending: Family Medicine | Admitting: Family Medicine

## 2016-04-04 DIAGNOSIS — R1031 Right lower quadrant pain: Secondary | ICD-10-CM | POA: Diagnosis not present

## 2016-04-04 DIAGNOSIS — Z5321 Procedure and treatment not carried out due to patient leaving prior to being seen by health care provider: Secondary | ICD-10-CM | POA: Diagnosis not present

## 2016-04-04 DIAGNOSIS — R102 Pelvic and perineal pain: Secondary | ICD-10-CM | POA: Insufficient documentation

## 2016-04-04 LAB — URINALYSIS, ROUTINE W REFLEX MICROSCOPIC
Bilirubin Urine: NEGATIVE
Glucose, UA: NEGATIVE mg/dL
KETONES UR: NEGATIVE mg/dL
Nitrite: NEGATIVE
PH: 6 (ref 5.0–8.0)
Protein, ur: NEGATIVE mg/dL
Specific Gravity, Urine: 1.012 (ref 1.005–1.030)

## 2016-04-04 LAB — POCT PREGNANCY, URINE: Preg Test, Ur: NEGATIVE

## 2016-04-04 NOTE — MAU Note (Addendum)
Pt had testing for cold sore earlier in the month, has not received results, also seeing white bumps on L upper lip.  Pt states she had a papsmear in September & was told she had a bartholin's cyst, wants it checked, would like for it to be drained.  Has some R groin & pelvic pain.  Denies bleeding or discharge.

## 2016-04-04 NOTE — MAU Note (Signed)
Called pt from lobby, no answer 

## 2016-04-04 NOTE — MAU Note (Signed)
Called from lobby, pt not there

## 2016-04-07 ENCOUNTER — Encounter (HOSPITAL_COMMUNITY): Payer: Self-pay | Admitting: *Deleted

## 2016-04-07 ENCOUNTER — Emergency Department (HOSPITAL_COMMUNITY)
Admission: EM | Admit: 2016-04-07 | Discharge: 2016-04-07 | Disposition: A | Discharge status: Home / Self Care | Payer: Medicaid Other | Attending: Emergency Medicine | Admitting: Emergency Medicine

## 2016-04-07 DIAGNOSIS — K6289 Other specified diseases of anus and rectum: Secondary | ICD-10-CM | POA: Diagnosis present

## 2016-04-07 DIAGNOSIS — L0591 Pilonidal cyst without abscess: Secondary | ICD-10-CM | POA: Diagnosis not present

## 2016-04-07 DIAGNOSIS — F17210 Nicotine dependence, cigarettes, uncomplicated: Secondary | ICD-10-CM | POA: Insufficient documentation

## 2016-04-07 DIAGNOSIS — N750 Cyst of Bartholin's gland: Secondary | ICD-10-CM

## 2016-04-07 MED ORDER — SULFAMETHOXAZOLE-TRIMETHOPRIM 800-160 MG PO TABS
1.0000 | ORAL_TABLET | Freq: Once | ORAL | Status: AC
  Administered 2016-04-07: 1 via ORAL
  Filled 2016-04-07: qty 1

## 2016-04-07 MED ORDER — SULFAMETHOXAZOLE-TRIMETHOPRIM 800-160 MG PO TABS: 1.0000 | ORAL_TABLET | Freq: Two times a day (BID) | ORAL | 0 refills | Status: AC

## 2016-04-07 NOTE — ED Provider Notes (Signed)
[facility]-EMERGENCY DEPT Provider Note   CSN: [id number] Arrival date & time: 04/07/16  1201     History   Chief Complaint Chief Complaint  Patient presents with  . Rectal Pain    HPI Malay L Mcandrew is a 31 y.o. female.  Patient is a healthy 31 year old female presenting today with 2 separate complaints. Initial complaint is of pain in her gluteal cleft for the last 2 weeks that is gradually worsening. She describes it as a sharp pain which is worse with sitting. She has noticed some mild swelling in the area but denies any drainage or redness. She has never had problems like this before but did notice several weeks ago black heads in the area that is tender. 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 She denies any pain or drainage. She has tried soaking it without any improvement in her symptoms. She has had no difficulty urinating and has not noticed any redness.   The history is provided by the patient.    Past Medical History:  Diagnosis Date  . Chlamydia infection, current pregnancy 12/12   Test of cure negative in April  . GERD (gastroesophageal reflux disease)   . Gonorrhea, current pregnancy 12/12   Test of cure negative in April  . Headache(784.0)    no hx prior to preg  . Trichomonas     There are no active problems to display for this patient.   Past Surgical History:  Procedure Laterality Date  . NO PAST SURGERIES      OB History    Gravida Para Term Preterm AB Living   2 1 1  0 1 1   SAB TAB Ectopic Multiple Live Births   1 0 0 0 1       Home Medications    Prior to Admission medications   Medication Sig Start Date End Date Taking? Authorizing Provider  acetaminophen (TYLENOL) 500 MG tablet Take 500-1,000 mg by mouth every 6 (six) hours as needed. For pain    Historical Provider, MD  levonorgestrel (MIRENA) 20 MCG/24HR IUD 1 each by Intrauterine route continuous.  Started 2013.    Historical Provider, MD  ranitidine (ZANTAC) 150 MG capsule Take 150 mg by mouth daily as needed for heartburn.    Historical Provider, MD  sulfamethoxazole-trimethoprim (BACTRIM DS,SEPTRA DS) 800-160 MG tablet Take 1 tablet by mouth 2 (two) times daily. 04/07/16 04/14/16  Gwyneth Sprout, MD    Family History Family History  Problem Relation Age of Onset  . Diabetes Maternal Grandmother   . Anesthesia problems Neg Hx   . Hypotension Neg Hx   . Malignant hyperthermia Neg Hx   . Pseudochol deficiency Neg Hx   . Other Neg Hx     Social History Social History  Substance Use Topics  . Smoking status: Current Every Day Smoker    Packs/day: 0.00    Types: Cigarettes    Last attempt to quit: 02/18/2011  . Smokeless tobacco: Current User  . Alcohol use No     Allergies   Patient has no known allergies.   Review of Systems Review of Systems  All other systems reviewed and are negative.    Physical Exam Updated Vital Signs BP 109/58 (BP Location: Left Arm)   Pulse 80   Temp 98.3 F (36.8 C) (Oral)   Resp 18   LMP 03/22/2015 (Approximate)   SpO2 100%  Physical Exam  Constitutional: She is oriented to person, place, and time. She appears well-developed and well-nourished. No distress.  HENT:  Head: Normocephalic and atraumatic.  Eyes: EOM are normal. Pupils are equal, round, and reactive to light.  Cardiovascular: Normal rate.   Pulmonary/Chest: Effort normal.  Genitourinary:     Neurological: She is alert and oriented to person, place, and time.  Skin: Skin is warm and dry. No erythema.  Psychiatric: She has a normal mood and affect. Her behavior is normal.  Nursing note and vitals reviewed.    ED Treatments / Results  Labs (all labs ordered are listed, but only abnormal results are displayed) Labs Reviewed - No data to display  EKG  EKG Interpretation None       Radiology No results found.  Procedures Procedures (including  critical care time)  Medications Ordered in ED Medications  sulfamethoxazole-trimethoprim (BACTRIM DS,SEPTRA DS) 800-160 MG per tablet 1 tablet (1 tablet Oral Given 04/07/16 1336)     Initial Impression / Assessment and Plan / ED Course  I have reviewed the triage vital signs and the nursing notes.  Pertinent labs & imaging results that were available during my care of the patient were reviewed by me and considered in my medical decision making (see chart for details).    Patient is a 31 year old female here with an uncomplicated Bartholin's gland cyst that does not need acute management. There is no sign of infection at this time and gave her referral to OB/GYN for possible removal in the future. Secondly patient has a small developing pilonidal cyst without erythema or significant induration. No reason for I&D today but they give a course of Bactrim in case early infection as patient is having pain.  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     Gwyneth SproutWhitney Verta Riedlinger, MD 04/07/16 1343

## 2016-04-07 NOTE — ED Triage Notes (Signed)
Pt is here with pain in her butt and states it is her bone and feels a lump on the side.  No drainage

## 2016-04-07 NOTE — ED Notes (Signed)
ED Provider at bedside. 

## 2016-04-09 ENCOUNTER — Inpatient Hospital Stay (EMERGENCY_DEPARTMENT_HOSPITAL)
Admission: AD | Admit: 2016-04-09 | Discharge: 2016-04-09 | Disposition: A | Discharge status: Home / Self Care | Payer: Medicaid Other | Source: Ambulatory Visit | Attending: Obstetrics and Gynecology | Admitting: Obstetrics and Gynecology

## 2016-04-09 ENCOUNTER — Emergency Department (HOSPITAL_COMMUNITY): Payer: Medicaid Other

## 2016-04-09 ENCOUNTER — Emergency Department (HOSPITAL_COMMUNITY)
Admission: EM | Admit: 2016-04-09 | Discharge: 2016-04-10 | Disposition: A | Discharge status: Home / Self Care | Payer: Medicaid Other | Attending: Emergency Medicine | Admitting: Emergency Medicine

## 2016-04-09 ENCOUNTER — Encounter (HOSPITAL_COMMUNITY): Payer: Self-pay

## 2016-04-09 ENCOUNTER — Encounter (HOSPITAL_COMMUNITY): Payer: Self-pay | Admitting: *Deleted

## 2016-04-09 DIAGNOSIS — N760 Acute vaginitis: Secondary | ICD-10-CM

## 2016-04-09 DIAGNOSIS — B9689 Other specified bacterial agents as the cause of diseases classified elsewhere: Secondary | ICD-10-CM | POA: Insufficient documentation

## 2016-04-09 DIAGNOSIS — Z87891 Personal history of nicotine dependence: Secondary | ICD-10-CM | POA: Insufficient documentation

## 2016-04-09 DIAGNOSIS — N907 Vulvar cyst: Secondary | ICD-10-CM | POA: Diagnosis not present

## 2016-04-09 DIAGNOSIS — R202 Paresthesia of skin: Secondary | ICD-10-CM | POA: Insufficient documentation

## 2016-04-09 DIAGNOSIS — F17210 Nicotine dependence, cigarettes, uncomplicated: Secondary | ICD-10-CM

## 2016-04-09 DIAGNOSIS — R3129 Other microscopic hematuria: Secondary | ICD-10-CM | POA: Diagnosis not present

## 2016-04-09 DIAGNOSIS — R935 Abnormal findings on diagnostic imaging of other abdominal regions, including retroperitoneum: Secondary | ICD-10-CM | POA: Insufficient documentation

## 2016-04-09 DIAGNOSIS — R21 Rash and other nonspecific skin eruption: Secondary | ICD-10-CM | POA: Diagnosis not present

## 2016-04-09 DIAGNOSIS — L723 Sebaceous cyst: Secondary | ICD-10-CM | POA: Insufficient documentation

## 2016-04-09 DIAGNOSIS — Z8619 Personal history of other infectious and parasitic diseases: Secondary | ICD-10-CM

## 2016-04-09 LAB — COMPREHENSIVE METABOLIC PANEL
ALBUMIN: 4.4 g/dL (ref 3.5–5.0)
ALK PHOS: 44 U/L (ref 38–126)
ALT: 12 U/L — AB (ref 14–54)
AST: 14 U/L — AB (ref 15–41)
Anion gap: 8 (ref 5–15)
BILIRUBIN TOTAL: 0.4 mg/dL (ref 0.3–1.2)
BUN: 7 mg/dL (ref 6–20)
CALCIUM: 9.3 mg/dL (ref 8.9–10.3)
CO2: 23 mmol/L (ref 22–32)
CREATININE: 0.86 mg/dL (ref 0.44–1.00)
Chloride: 105 mmol/L (ref 101–111)
GFR calc Af Amer: >60 mL/min (ref >60)
GLUCOSE: 86 mg/dL (ref 65–99)
POTASSIUM: 3.2 mmol/L — AB (ref 3.5–5.1)
Sodium: 136 mmol/L (ref 135–145)
TOTAL PROTEIN: 7.2 g/dL (ref 6.5–8.1)

## 2016-04-09 LAB — CBC WITH DIFFERENTIAL/PLATELET
BASOS ABS: 0 10*3/uL (ref 0.0–0.1)
BASOS PCT: 0 %
Eosinophils Absolute: 0 10*3/uL (ref 0.0–0.7)
Eosinophils Relative: 0 %
HEMATOCRIT: 34.4 % — AB (ref 36.0–46.0)
HEMOGLOBIN: 12.5 g/dL (ref 12.0–15.0)
LYMPHS PCT: 39 %
Lymphs Abs: 2.7 10*3/uL (ref 0.7–4.0)
MCH: 32.1 pg (ref 26.0–34.0)
MCHC: 36.3 g/dL — ABNORMAL HIGH (ref 30.0–36.0)
MCV: 88.2 fL (ref 78.0–100.0)
Monocytes Absolute: 0.6 10*3/uL (ref 0.1–1.0)
Monocytes Relative: 9 %
NEUTROS ABS: 3.5 10*3/uL (ref 1.7–7.7)
NEUTROS PCT: 52 %
Platelets: 251 10*3/uL (ref 150–400)
RBC: 3.9 MIL/uL (ref 3.87–5.11)
RDW: 13.3 % (ref 11.5–15.5)
WBC: 6.8 10*3/uL (ref 4.0–10.5)

## 2016-04-09 LAB — URINALYSIS, ROUTINE W REFLEX MICROSCOPIC
BILIRUBIN URINE: NEGATIVE
Bacteria, UA: NONE SEEN
GLUCOSE, UA: NEGATIVE mg/dL
KETONES UR: NEGATIVE mg/dL
Nitrite: NEGATIVE
PROTEIN: NEGATIVE mg/dL
Specific Gravity, Urine: 1.024 (ref 1.005–1.030)
pH: 5 (ref 5.0–8.0)

## 2016-04-09 LAB — POCT PREGNANCY, URINE: Preg Test, Ur: NEGATIVE

## 2016-04-09 LAB — HIV ANTIBODY (ROUTINE TESTING W REFLEX): HIV Screen 4th Generation wRfx: NONREACTIVE

## 2016-04-09 LAB — WET PREP, GENITAL
SPERM: NONE SEEN
Trich, Wet Prep: NONE SEEN
YEAST WET PREP: NONE SEEN

## 2016-04-09 MED ORDER — IBUPROFEN 600 MG PO TABS: 600.0000 mg | ORAL_TABLET | Freq: Four times a day (QID) | ORAL | 1 refills | Status: DC | PRN

## 2016-04-09 MED ORDER — ONDANSETRON 4 MG PO TBDP
4.0000 mg | ORAL_TABLET | Freq: Three times a day (TID) | ORAL | Status: DC | PRN
  Administered 2016-04-09 – 2016-04-10 (×2): 4 mg via ORAL
  Filled 2016-04-09 (×2): qty 1

## 2016-04-09 MED ORDER — ONDANSETRON HCL 4 MG PO TABS: 4.0000 mg | ORAL_TABLET | Freq: Four times a day (QID) | ORAL | 0 refills | Status: DC

## 2016-04-09 MED ORDER — METRONIDAZOLE 500 MG PO TABS: 500.0000 mg | ORAL_TABLET | Freq: Two times a day (BID) | ORAL | 0 refills | Status: DC

## 2016-04-09 NOTE — MAU Provider Note (Signed)
History     CSN: [id number]  Arrival date and time: 04/09/16 0115   First Provider Initiated Contact with Patient 04/09/16 0245      Chief Complaint  Patient presents with  . Vaginitis   Kristie Sandoval is a 31 y.o. G2P1011 presenting with genital lesion which she believes is a Bartholin's cyst. She states it's been present for 4 years. It does not drain but causes discomfort. She has never had treatment. She was seen for this 2 days ago in [facility] ED and referred to GYN.  Also at that ED visit she reported 2 wk duration of of rectal pain and is still having "tailbone pain." She was diagnosed with small developing pilonidal cyst and given Bactrim prescription which she began today.  She has another lesion that she believes may be herpes. She expressed pus from this lesion. No known contact or prior diagnosis of genital herpes.  She reports nausea of a few days duration without vomiting, malaise or fever.  Requests STI screening.       OB History  Gravida Para Term Preterm AB Living  2 1 1  0 1 1  SAB TAB Ectopic Multiple Live Births  1 0 0 0 1    # Outcome Date GA Lbr Len/2nd Weight Sex Delivery Anes PTL Lv  2 Term 10/26/11 [redacted]w[redacted]d 15:36 / 00:42 2.863 kg (6 lb 5 oz) F Vag-Spont EPI  LIV     Birth Comments: WNL   1 SAB                Past Medical History:  Diagnosis Date  . Chlamydia infection, current pregnancy 12/12   Test of cure negative in April  . GERD (gastroesophageal reflux disease)   . Gonorrhea, current pregnancy 12/12   Test of cure negative in April  . Headache(784.0)    no hx prior to preg  . Trichomonas     Past Surgical History:  Procedure Laterality Date  . NO PAST SURGERIES      Family History  Problem Relation Age of Onset  . Diabetes Maternal Grandmother   . Anesthesia problems Neg Hx   . Hypotension Neg Hx   . Malignant hyperthermia Neg Hx   . Pseudochol deficiency Neg Hx   . Other Neg Hx     Social History  Substance Use Topics  .  Smoking status: Current Every Day Smoker    Packs/day: 0.00    Types: Cigarettes    Last attempt to quit: 02/18/2011  . Smokeless tobacco: Current User  . Alcohol use No    Allergies: No Known Allergies  Prescriptions Prior to Admission  Medication Sig Dispense Refill Last Dose  . acetaminophen (TYLENOL) 500 MG tablet Take 500-1,000 mg by mouth every 6 (six) hours as needed. For pain   Not Taking at Unknown time  . levonorgestrel (MIRENA) 20 MCG/24HR IUD 1 each by Intrauterine route continuous. Started 2013.   11/03/2014 at Unknown time  . ranitidine (ZANTAC) 150 MG capsule Take 150 mg by mouth daily as needed for heartburn.   Not Taking at Unknown time  . sulfamethoxazole-trimethoprim (BACTRIM DS,SEPTRA DS) 800-160 MG tablet Take 1 tablet by mouth 2 (two) times daily. 14 tablet 0      Review of Systems  Constitutional: Negative for fever.  Gastrointestinal: Positive for nausea and rectal pain. Negative for abdominal pain, anal bleeding, blood in stool, constipation, diarrhea and vomiting.  Genitourinary: Positive for frequency, genital sores, vaginal discharge and vaginal pain. Negative  for hematuria, menstrual problem, pelvic pain, urgency and vaginal bleeding.  Musculoskeletal: Positive for back pain.  Psychiatric/Behavioral: The patient is nervous/anxious.    Physical Exam   Blood pressure 115/78, pulse 69, temperature 98.1 F (36.7 C), temperature source Oral, resp. rate 18, last menstrual period 03/22/2015.  Physical Exam  Nursing note and vitals reviewed. Constitutional: She is oriented to person, place, and time. She appears well-developed and well-nourished. No distress.  HENT:  Head: Normocephalic.  Eyes: Pupils are equal, round, and reactive to light.  Neck: Normal range of motion.  Cardiovascular: Normal rate.   Respiratory: Effort normal.  GI: Soft. There is no tenderness.  Genitourinary: Vaginal discharge found.  Genitourinary Comments: External genitalia: 1cm  firm nodule on mid right labiuim majorum, no skin erythema; 2mm papular lesion inferior to left forchette, not inflamed or draining Spec: scant creamy brownish discharge; cx clean Bimanual: No CMT, Uterus NSSP, no adnexal tenderness or masses    Musculoskeletal: Normal range of motion.  Neurological: She is alert and oriented to person, place, and time.  Skin: Skin is warm and dry.  Slight tenderness to left   Psychiatric: She has a normal mood and affect.    MAU Course  Procedures Results for orders placed or performed during the hospital encounter of 04/09/16 (from the past 24 hour(s))  Urinalysis, Routine w reflex microscopic     Status: Abnormal   Collection Time: 04/09/16  2:12 AM  Result Value Ref Range   Color, Urine YELLOW YELLOW   APPearance HAZY (A) CLEAR   Specific Gravity, Urine 1.024 1.005 - 1.030   pH 5.0 5.0 - 8.0   Glucose, UA NEGATIVE NEGATIVE mg/dL   Hgb urine dipstick LARGE (A) NEGATIVE   Bilirubin Urine NEGATIVE NEGATIVE   Ketones, ur NEGATIVE NEGATIVE mg/dL   Protein, ur NEGATIVE NEGATIVE mg/dL   Nitrite NEGATIVE NEGATIVE   Leukocytes, UA TRACE (A) NEGATIVE   RBC / HPF 6-30 0 - 5 RBC/hpf   WBC, UA 0-5 0 - 5 WBC/hpf   Bacteria, UA NONE SEEN NONE SEEN   Squamous Epithelial / LPF 0-5 (A) NONE SEEN   Mucous PRESENT   Pregnancy, urine POC     Status: None   Collection Time: 04/09/16  2:23 AM  Result Value Ref Range   Preg Test, Ur NEGATIVE NEGATIVE  Wet prep, genital     Status: Abnormal   Collection Time: 04/09/16  3:06 AM  Result Value Ref Range   Yeast Wet Prep HPF POC NONE SEEN NONE SEEN   Trich, Wet Prep NONE SEEN NONE SEEN   Clue Cells Wet Prep HPF POC PRESENT (A) NONE SEEN   WBC, Wet Prep HPF POC FEW (A) NONE SEEN   Sperm NONE SEEN   GC,CT, HIV, Urine culture sent   Assessment and Plan   1. Sebaceous cyst of labia   2. BV (bacterial vaginosis)    Advised hot soaks to genital lesions   Allergies as of 04/09/2016   No Known Allergies      Medication List    TAKE these medications   acetaminophen 500 MG tablet Commonly known as:  TYLENOL Take 500-1,000 mg by mouth every 6 (six) hours as needed. For pain   ibuprofen 600 MG tablet Commonly known as:  ADVIL,MOTRIN Take 1 tablet (600 mg total) by mouth every 6 (six) hours as needed.   levonorgestrel 20 MCG/24HR IUD Commonly known as:  MIRENA 1 each by Intrauterine route continuous. Started 2013.   metroNIDAZOLE 500  MG tablet Commonly known as:  FLAGYL Take 1 tablet (500 mg total) by mouth 2 (two) times daily.   ondansetron 4 MG tablet Commonly known as:  ZOFRAN Take 1 tablet (4 mg total) by mouth every 6 (six) hours.   ranitidine 150 MG capsule Commonly known as:  ZANTAC Take 150 mg by mouth daily as needed for heartburn.   sulfamethoxazole-trimethoprim 800-160 MG tablet Commonly known as:  BACTRIM DS,SEPTRA DS Take 1 tablet by mouth 2 (two) times daily.       Follow-up Information    [facility]. Schedule an appointment as soon as possible for a visit in 1 month(s).   Contact information: [address] [place] [address] [phone]           Gatlyn Lipari 04/09/2016, 3:37 AM

## 2016-04-09 NOTE — Progress Notes (Signed)
D. Poe CNM in to discuss d/c plan with pt. WRitten and verbal d/c instructions given and understanding voiced

## 2016-04-09 NOTE — ED Triage Notes (Signed)
Per EMS- [facility] for [facility]/o blood in her urine. Patient was prescribed sulfa. Patient states she has had a total of 4 tabs. Today, patient c/o tingling all over. Patient also reports spots on her body.

## 2016-04-09 NOTE — ED Notes (Signed)
Bed: WA08 Expected date:  Expected time:  Means of arrival:  Comments: 

## 2016-04-09 NOTE — MAU Note (Signed)
Pt stated she has a small bartholin cyst that is feeling more irritated. Has had pain on her tailbone [facility] and told she had a pilonidal cyst and given antibiotics. Not sure if it was that. Wants to be tested for STDs because she has not felt right for months.  Also c/o a lesion on her vagina that may be herpes

## 2016-04-09 NOTE — ED Notes (Signed)
Patient transported to CT 

## 2016-04-09 NOTE — ED Triage Notes (Signed)
No visible rash present. When moaning and groaning stating sheis 'on fire."

## 2016-04-10 LAB — URINE CULTURE
Culture: <10000 — AB
Special Requests: NORMAL

## 2016-04-10 MED ORDER — PREDNISONE 50 MG PO TABS: 50.0000 mg | ORAL_TABLET | Freq: Every day | ORAL | 0 refills | Status: DC

## 2016-04-10 MED ORDER — POTASSIUM CHLORIDE CRYS ER 20 MEQ PO TBCR: 20.0000 meq | EXTENDED_RELEASE_TABLET | Freq: Two times a day (BID) | ORAL | 0 refills | Status: DC

## 2016-04-10 MED ORDER — DIPHENHYDRAMINE HCL 50 MG/ML IJ SOLN
25.0000 mg | Freq: Once | INTRAMUSCULAR | Status: AC
  Administered 2016-04-10: 25 mg via INTRAMUSCULAR

## 2016-04-10 MED ORDER — DIPHENHYDRAMINE HCL 50 MG/ML IJ SOLN
25.0000 mg | Freq: Once | INTRAMUSCULAR | Status: DC
  Filled 2016-04-10: qty 1

## 2016-04-10 MED ORDER — PREDNISONE 20 MG PO TABS
60.0000 mg | ORAL_TABLET | Freq: Once | ORAL | Status: AC
  Administered 2016-04-10: 60 mg via ORAL
  Filled 2016-04-10: qty 3

## 2016-04-10 MED ORDER — LORATADINE 10 MG PO TABS: 10.0000 mg | ORAL_TABLET | Freq: Every day | ORAL | 0 refills | Status: DC

## 2016-04-10 MED ORDER — POTASSIUM CHLORIDE CRYS ER 20 MEQ PO TBCR
40.0000 meq | EXTENDED_RELEASE_TABLET | Freq: Once | ORAL | Status: AC
  Administered 2016-04-10: 40 meq via ORAL
  Filled 2016-04-10: qty 2

## 2016-04-10 NOTE — ED Provider Notes (Signed)
[facility]-EMERGENCY DEPT Provider Note   CSN: [id number] Arrival date & time: 04/09/16  1759  By signing my name below, I, Cynda AcresHailei Fulton, attest that this documentation has Sandoval prepared under the direction and in the presence of Dione Boozeavid Corran Lalone, MD. Electronically Signed: Cynda AcresHailei Fulton, Scribe. 04/10/16. 12:26 AM.   History   Chief Complaint Chief Complaint  Patient presents with  . Tingling    HPI Comments: Kristie Sandoval is a 31 y.o. female who presents by ambulance to the Emergency Department complaining of intermittent tingling that began one week ago. Patient states she began tingling at her lips, went away and returned all over the body. Patient has an associated dysuria, chills, vomiting, nausea, and a "break out" of the skin. Patient states she feels as if she is on fire.  Pateint was started on antibiotics yesterday. No modifying factors indicated. She denies any diaphoresis, sore throat, weakness, or fever. LNMP was the 03/21/16.   The history is provided by the patient. No language interpreter was used.    Past Medical History:  Diagnosis Date  . Chlamydia infection, current pregnancy 12/12   Test of cure negative in April  . GERD (gastroesophageal reflux disease)   . Gonorrhea, current pregnancy 12/12   Test of cure negative in April  . Headache(784.0)    no hx prior to preg  . Trichomonas     There are no active problems to display for this patient.   Past Surgical History:  Procedure Laterality Date  . NO PAST SURGERIES      OB History    Gravida Para Term Preterm AB Living   2 1 1  0 1 1   SAB TAB Ectopic Multiple Live Births   1 0 0 0 1       Home Medications    Prior to Admission medications   Medication Sig Start Date End Date Taking? Authorizing Provider  acetaminophen (TYLENOL) 500 MG tablet Take 500-1,000 mg by mouth every 6 (six) hours as needed. For pain   Yes Historical Provider, MD  ibuprofen (ADVIL,MOTRIN) 600 MG tablet Take 1 tablet (600 mg  total) by mouth every 6 (six) hours as needed. 04/09/16  Yes Deirdre Colin Mulders Poe, CNM  levonorgestrel (MIRENA) 20 MCG/24HR IUD 1 each by Intrauterine route continuous. Started 2013.   Yes Historical Provider, MD  ranitidine (ZANTAC) 150 MG capsule Take 150 mg by mouth daily as needed for heartburn.   Yes Historical Provider, MD  sulfamethoxazole-trimethoprim (BACTRIM DS,SEPTRA DS) 800-160 MG tablet Take 1 tablet by mouth 2 (two) times daily. 04/07/16 04/14/16 Yes Gwyneth SproutWhitney Plunkett, MD  metroNIDAZOLE (FLAGYL) 500 MG tablet Take 1 tablet (500 mg total) by mouth 2 (two) times daily. 04/09/16   Deirdre C Poe, CNM  ondansetron (ZOFRAN) 4 MG tablet Take 1 tablet (4 mg total) by mouth every 6 (six) hours. 04/09/16   Deirdre Colin Mulders Poe, CNM    Family History Family History  Problem Relation Age of Onset  . Diabetes Maternal Grandmother   . Anesthesia problems Neg Hx   . Hypotension Neg Hx   . Malignant hyperthermia Neg Hx   . Pseudochol deficiency Neg Hx   . Other Neg Hx     Social History Social History  Substance Use Topics  . Smoking status: Former Smoker    Packs/day: 0.00    Types: Cigarettes    Quit date: 02/18/2011  . Smokeless tobacco: Current User  . Alcohol use No     Allergies   Patient has  no known allergies.   Review of Systems Review of Systems  Constitutional: Positive for chills. Negative for diaphoresis and fever.  Gastrointestinal: Positive for vomiting.  All other systems reviewed and are negative.    Physical Exam Updated Vital Signs BP 95/65 (BP Location: Left Arm)   Pulse (!) 43   Temp 98.3 F (36.8 C) (Oral)   Resp 18   Ht 5\' 5"  (1.651 m)   Wt 139 lb (63 kg)   LMP 03/22/2015 (Approximate)   SpO2 92%   BMI 23.13 kg/m   Physical Exam  Constitutional: She is oriented to person, place, and time. She appears well-developed and well-nourished.  HENT:  Head: Normocephalic and atraumatic.  Eyes: EOM are normal. Pupils are equal, round, and reactive to light.  Neck:  Normal range of motion. Neck supple. No JVD present.  Cardiovascular: Normal rate, regular rhythm and normal heart sounds.   No murmur heard. Pulmonary/Chest: Effort normal and breath sounds normal. She has no wheezes. She has no rales. She exhibits no tenderness.  Abdominal: Soft. Bowel sounds are normal. She exhibits no distension and no mass. There is no tenderness.  Musculoskeletal: Normal range of motion. She exhibits no edema.  Lymphadenopathy:    She has no cervical adenopathy.  Neurological: She is alert and oriented to person, place, and time. No cranial nerve deficit. She exhibits normal muscle tone. Coordination normal.  Skin: Skin is warm and dry. No rash noted.  Scattered 1-2 mm macules which are hypopigmented nonspecific in appearance.   Psychiatric: She has a normal mood and affect. Her behavior is normal. Judgment and thought content normal.  Nursing note and vitals reviewed.    ED Treatments / Results  DIAGNOSTIC STUDIES: Oxygen Saturation is 92% on RA, normal by my interpretation.    COORDINATION OF CARE: 12:23 AM Discussed treatment plan with pt at bedside and pt agreed to plan.  Labs (all labs ordered are listed, but only abnormal results are displayed) Labs Reviewed  CBC WITH DIFFERENTIAL/PLATELET - Abnormal; Notable for the following:       Result Value   HCT 34.4 (*)    MCHC 36.3 (*)    All other components within normal limits  COMPREHENSIVE METABOLIC PANEL - Abnormal; Notable for the following:    Potassium 3.2 (*)    AST 14 (*)    ALT 12 (*)    All other components within normal limits    Radiology Ct Renal Stone Study  Result Date: 04/09/2016 CLINICAL DATA:  31 y/o F; right-sided abdominal pain with hematuria. EXAM: CT ABDOMEN AND PELVIS WITHOUT CONTRAST TECHNIQUE: Multidetector CT imaging of the abdomen and pelvis was performed following the standard protocol without IV contrast. COMPARISON:  None. FINDINGS: Lower chest: No acute abnormality.  Hepatobiliary: No focal liver abnormality is seen. No gallstones, gallbladder wall thickening, or biliary dilatation. Pancreas: Unremarkable. No pancreatic ductal dilatation or surrounding inflammatory changes. Spleen: Normal in size without focal abnormality. Adrenals/Urinary Tract: Right kidney lower pole punctate nonobstructing caliceal stone. No hydronephrosis. Normal bladder. Normal adrenal glands. Stomach/Bowel: Stomach is within normal limits. Appendix appears normal. No evidence of bowel wall thickening, distention, or inflammatory changes. Vascular/Lymphatic: No significant vascular findings are present. No enlarged abdominal or pelvic lymph nodes. Reproductive: Uterus and bilateral adnexa are unremarkable. Well seated IUD. Other: No abdominal wall hernia or abnormality. No abdominopelvic ascites. Musculoskeletal: No acute or significant osseous findings. IMPRESSION: 1. Right kidney lower pole punctate nonobstructing caliceal stone. No hydronephrosis or obstructive uropathy. 2. Otherwise unremarkable  CT of abdomen and pelvis without contrast. Electronically Signed   By: Mitzi Hansen M.D.   On: 04/09/2016 22:25    Procedures Procedures (including critical care time)  Medications Ordered in ED Medications  ondansetron (ZOFRAN-ODT) disintegrating tablet 4 mg (4 mg Oral Given 04/09/16 2311)     Initial Impression / Assessment and Plan / ED Course  I have reviewed the triage vital signs and the nursing notes.  Pertinent labs & imaging results that were available during my care of the patient were reviewed by me and considered in my medical decision making (see chart for details).  Clinical Course as of Apr 10 16  Sun Apr 10, 2016  0011 CT Renal Soundra Pilon [DG]    Clinical Course User Index [DG] Dione Booze, MD   Papular rash of uncertain cause. Appearance is quite nonspecific. No evidence of urticaria and appearance is not appear typical of tinea course. Tingling and burning  of uncertain cause. Symptoms predated starting on sulfa, Cytoxan allergy. Laboratory workup does show mild hypokalemia which may be contributing to some of her numbness and tingling. Will give prescription for K Dur and had dry empiric short course of prednisone with loratadine. Old records are reviewed, and she was seen at [facility] yesterday for sebaceous cyst of the vulva and started on Bactrim and metronidazole. She has had hematuria in the last several urine samples over the last 3 weeks without evidence of urinary tract infection. She may need evaluation by urology.  Final Clinical Impressions(s) / ED Diagnoses   Final diagnoses:  Macular rash  Microscopic hematuria  Paresthesia    New Prescriptions New Prescriptions   LORATADINE (CLARITIN) 10 MG TABLET    Take 1 tablet (10 mg total) by mouth daily.   POTASSIUM CHLORIDE SA (K-DUR,KLOR-CON) 20 MEQ TABLET    Take 1 tablet (20 mEq total) by mouth 2 (two) times daily.   PREDNISONE (DELTASONE) 50 MG TABLET    Take 1 tablet (50 mg total) by mouth daily.   I personally performed the services described in this documentation, which was scribed in my presence. The recorded information has Sandoval reviewed and is accurate.       Dione Booze, MD 04/10/16 [phone]

## 2016-04-10 NOTE — Discharge Instructions (Signed)
Have your urine checked after completing the course of Bactrim. If you are still having blood, you will need to see the urologist.  If the rash persists, you will need to see a dermatologist.

## 2016-04-11 LAB — GC/CHLAMYDIA PROBE AMP (~~LOC~~) NOT AT ARMC
Chlamydia: NEGATIVE
Neisseria Gonorrhea: NEGATIVE

## 2016-04-17 ENCOUNTER — Encounter (HOSPITAL_COMMUNITY): Payer: Self-pay | Admitting: *Deleted

## 2016-04-17 ENCOUNTER — Inpatient Hospital Stay (HOSPITAL_COMMUNITY)
Admission: AD | Admit: 2016-04-17 | Discharge: 2016-04-17 | Disposition: A | Discharge status: Home / Self Care | Payer: Medicaid Other | Source: Ambulatory Visit | Attending: Obstetrics & Gynecology | Admitting: Obstetrics & Gynecology

## 2016-04-17 DIAGNOSIS — Z87891 Personal history of nicotine dependence: Secondary | ICD-10-CM | POA: Insufficient documentation

## 2016-04-17 DIAGNOSIS — L989 Disorder of the skin and subcutaneous tissue, unspecified: Secondary | ICD-10-CM | POA: Diagnosis present

## 2016-04-17 DIAGNOSIS — H9202 Otalgia, left ear: Secondary | ICD-10-CM | POA: Insufficient documentation

## 2016-04-17 DIAGNOSIS — Z711 Person with feared health complaint in whom no diagnosis is made: Secondary | ICD-10-CM | POA: Insufficient documentation

## 2016-04-17 DIAGNOSIS — Z8619 Personal history of other infectious and parasitic diseases: Secondary | ICD-10-CM | POA: Insufficient documentation

## 2016-04-17 DIAGNOSIS — F419 Anxiety disorder, unspecified: Secondary | ICD-10-CM | POA: Insufficient documentation

## 2016-04-17 LAB — URINALYSIS, ROUTINE W REFLEX MICROSCOPIC
BACTERIA UA: NONE SEEN
Bilirubin Urine: NEGATIVE
Glucose, UA: NEGATIVE mg/dL
Ketones, ur: NEGATIVE mg/dL
Leukocytes, UA: NEGATIVE
Nitrite: NEGATIVE
Protein, ur: NEGATIVE mg/dL
SPECIFIC GRAVITY, URINE: 1.004 — AB (ref 1.005–1.030)
pH: 6 (ref 5.0–8.0)

## 2016-04-17 LAB — POCT PREGNANCY, URINE: PREG TEST UR: NEGATIVE

## 2016-04-17 NOTE — MAU Provider Note (Signed)
History     CSN: [id number]  Arrival date and time: 04/17/16 1347   First Provider Initiated Contact with Patient 04/17/16 1442      Chief Complaint  Patient presents with  . wanting testing   HPI Kristie Sandoval is a 31 y.o. G2P1011 who presents to MAU today with multiple complaints. The patient states that she wants HIV and Syphilis testing performed. She is requesting the rapid HIV test. She had a negative test last week, but insists that she was not yet in the timeframe for the test to be positive since the sexual contact. She does not know if that person has HIV, but is very anxious that she might because of skin lesions and unexplained weight loss. The sexual contact was ~ 4 weeks ago. She states that she had oral sex with this person once and has not seen him since.   She also has complaint of a sore on her tongue x 2 weeks. She also has complaint of left ear pain and possible fever. She states that she first took her temperature using a tympanic thermometer and temperature was 100.3 F, then repeated it and it was 62 F. She denies cough or congestion.   OB History    Gravida Para Term Preterm AB Living   2 1 1  0 1 1   SAB TAB Ectopic Multiple Live Births   1 0 0 0 1      Past Medical History:  Diagnosis Date  . Chlamydia infection, current pregnancy 12/12   Test of cure negative in April  . GERD (gastroesophageal reflux disease)   . Gonorrhea, current pregnancy 12/12   Test of cure negative in April  . Headache(784.0)    no hx prior to preg  . Trichomonas     Past Surgical History:  Procedure Laterality Date  . NO PAST SURGERIES      Family History  Problem Relation Age of Onset  . Diabetes Maternal Grandmother   . Anesthesia problems Neg Hx   . Hypotension Neg Hx   . Malignant hyperthermia Neg Hx   . Pseudochol deficiency Neg Hx   . Other Neg Hx     Social History  Substance Use Topics  . Smoking status: Former Smoker    Packs/day: 0.00    Types:  Cigarettes    Quit date: 02/18/2011  . Smokeless tobacco: Current User  . Alcohol use No    Allergies: No Known Allergies  Prescriptions Prior to Admission  Medication Sig Dispense Refill Last Dose  . acetaminophen (TYLENOL) 500 MG tablet Take 500-1,000 mg by mouth every 6 (six) hours as needed. For pain   unknown  . ibuprofen (ADVIL,MOTRIN) 600 MG tablet Take 1 tablet (600 mg total) by mouth every 6 (six) hours as needed. 30 tablet 1 04/09/2016 at Unknown time  . levonorgestrel (MIRENA) 20 MCG/24HR IUD 1 each by Intrauterine route continuous. Started 2013.   04/09/2016 at Unknown time  . loratadine (CLARITIN) 10 MG tablet Take 1 tablet (10 mg total) by mouth daily. 15 tablet 0   . metroNIDAZOLE (FLAGYL) 500 MG tablet Take 1 tablet (500 mg total) by mouth 2 (two) times daily. 14 tablet 0 has not picked up  . ondansetron (ZOFRAN) 4 MG tablet Take 1 tablet (4 mg total) by mouth every 6 (six) hours. 6 tablet 0 has not picked up  . potassium chloride SA (K-DUR,KLOR-CON) 20 MEQ tablet Take 1 tablet (20 mEq total) by mouth 2 (two) times  daily. 15 tablet 0   . predniSONE (DELTASONE) 50 MG tablet Take 1 tablet (50 mg total) by mouth daily. 5 tablet 0   . ranitidine (ZANTAC) 150 MG capsule Take 150 mg by mouth daily as needed for heartburn.   3 weeks    Review of Systems  Constitutional: Positive for unexpected weight change. Negative for fever.  HENT: Positive for ear pain and mouth sores. Negative for congestion, ear discharge, rhinorrhea, sinus pain, sinus pressure, sore throat and trouble swallowing.   Respiratory: Negative for cough.   Gastrointestinal: Negative for abdominal pain, constipation, diarrhea, nausea and vomiting.  Genitourinary: Negative for vaginal bleeding.  Skin: Positive for rash.   Physical Exam   Blood pressure 128/83, pulse 94, temperature 98.4 F (36.9 C), temperature source Oral, resp. rate 16, height 5\' 4"  (1.626 m), weight 135 lb 12.8 oz (61.6 kg), last menstrual  period 03/23/2016.  Physical Exam  Nursing note and vitals reviewed. Constitutional: She is oriented to person, place, and time. She appears well-developed and well-nourished. No distress.  HENT:  Head: Normocephalic and atraumatic.  Right Ear: Tympanic membrane, external ear and ear canal normal.  Left Ear: Tympanic membrane, external ear and ear canal normal.  Nose: No mucosal edema or rhinorrhea.  Mouth/Throat: Uvula is midline, oropharynx is clear and moist and mucous membranes are normal. No oral lesions. No oropharyngeal exudate, posterior oropharyngeal edema, posterior oropharyngeal erythema or tonsillar abscesses.  Cardiovascular: Normal rate.   Respiratory: Effort normal.  GI: Soft. She exhibits no distension.  Lymphadenopathy:       Head (right side): No submental, no submandibular and no tonsillar adenopathy present.       Head (left side): No submental, no submandibular and no tonsillar adenopathy present.    She has no cervical adenopathy.  Neurological: She is alert and oriented to person, place, and time.  Skin: Skin is warm and dry. No rash noted. No erythema.     Psychiatric: She has a normal mood and affect.    Results for orders placed or performed during the hospital encounter of 04/17/16 (from the past 24 hour(s))  Urinalysis, Routine w reflex microscopic     Status: Abnormal   Collection Time: 04/17/16  2:10 PM  Result Value Ref Range   Color, Urine STRAW (A) YELLOW   APPearance CLEAR CLEAR   Specific Gravity, Urine 1.004 (L) 1.005 - 1.030   pH 6.0 5.0 - 8.0   Glucose, UA NEGATIVE NEGATIVE mg/dL   Hgb urine dipstick MODERATE (A) NEGATIVE   Bilirubin Urine NEGATIVE NEGATIVE   Ketones, ur NEGATIVE NEGATIVE mg/dL   Protein, ur NEGATIVE NEGATIVE mg/dL   Nitrite NEGATIVE NEGATIVE   Leukocytes, UA NEGATIVE NEGATIVE   RBC / HPF 0-5 0 - 5 RBC/hpf   WBC, UA 0-5 0 - 5 WBC/hpf   Bacteria, UA NONE SEEN NONE SEEN   Squamous Epithelial / LPF 0-5 (A) NONE SEEN    Pregnancy, urine POC     Status: None   Collection Time: 04/17/16  2:18 PM  Result Value Ref Range   Preg Test, Ur NEGATIVE NEGATIVE    MAU Course  Procedures None  MDM UPT - negative UA, RPR, HIV today  Patient advised that she would not receive the rapid HIV test today because it was not warranted. Patient has MyChart and will review results when finalized unless positive, then we will call to discuss the results with the patient  Assessment and Plan  A: Physically well but worried Anxiety  P: Discharge home Patient [facility] Patient advised to follow-up with [facility] as needed for routine care Patient may return to MAU as needed or if her condition were to change or worsen  Marny LowensteinJulie N Bailynn Dyk, PA-C  04/17/2016, 3:03 PM

## 2016-04-17 NOTE — Discharge Instructions (Signed)

## 2016-04-17 NOTE — MAU Note (Signed)
Just really want to get an HIV and a syphilis test done. Can we do the fast one.  Has lost a lot of weight, having a lot of diarrhea.  Doesn't know if it is from the antibiotic she was on.  Had a sore throat.  Rash under her skin, itches.  Left ear is hurting right now.  Tongue feels swollen. Temp 100.3 last night

## 2016-04-18 LAB — HIV ANTIBODY (ROUTINE TESTING W REFLEX): HIV Screen 4th Generation wRfx: NONREACTIVE

## 2016-04-18 LAB — RPR: RPR Ser Ql: NONREACTIVE

## 2016-04-19 ENCOUNTER — Other Ambulatory Visit: Payer: Self-pay | Admitting: Surgery

## 2016-04-21 ENCOUNTER — Emergency Department (HOSPITAL_COMMUNITY): Payer: Medicaid Other

## 2016-04-21 ENCOUNTER — Encounter (HOSPITAL_COMMUNITY): Payer: Self-pay

## 2016-04-21 ENCOUNTER — Emergency Department (HOSPITAL_COMMUNITY)
Admission: EM | Admit: 2016-04-21 | Discharge: 2016-04-22 | Disposition: A | Discharge status: Home / Self Care | Payer: Medicaid Other | Attending: Emergency Medicine | Admitting: Emergency Medicine

## 2016-04-21 DIAGNOSIS — M791 Myalgia, unspecified site: Secondary | ICD-10-CM

## 2016-04-21 DIAGNOSIS — Z87891 Personal history of nicotine dependence: Secondary | ICD-10-CM | POA: Diagnosis not present

## 2016-04-21 DIAGNOSIS — B370 Candidal stomatitis: Secondary | ICD-10-CM

## 2016-04-21 DIAGNOSIS — R590 Localized enlarged lymph nodes: Secondary | ICD-10-CM | POA: Diagnosis present

## 2016-04-21 LAB — CBC WITH DIFFERENTIAL/PLATELET
BASOS PCT: 0 %
Basophils Absolute: 0 10*3/uL (ref 0.0–0.1)
Eosinophils Absolute: 0.1 10*3/uL (ref 0.0–0.7)
Eosinophils Relative: 1 %
HCT: 36.5 % (ref 36.0–46.0)
HEMOGLOBIN: 12.6 g/dL (ref 12.0–15.0)
Lymphocytes Relative: 52 %
Lymphs Abs: 4.4 10*3/uL — ABNORMAL HIGH (ref 0.7–4.0)
MCH: 31.6 pg (ref 26.0–34.0)
MCHC: 34.5 g/dL (ref 30.0–36.0)
MCV: 91.5 fL (ref 78.0–100.0)
MONO ABS: 0.6 10*3/uL (ref 0.1–1.0)
MONOS PCT: 7 %
NEUTROS PCT: 40 %
Neutro Abs: 3.4 10*3/uL (ref 1.7–7.7)
Platelets: 264 10*3/uL (ref 150–400)
RBC: 3.99 MIL/uL (ref 3.87–5.11)
RDW: 12.9 % (ref 11.5–15.5)
WBC: 8.5 10*3/uL (ref 4.0–10.5)

## 2016-04-21 LAB — RAPID STREP SCREEN (MED CTR MEBANE ONLY): STREPTOCOCCUS, GROUP A SCREEN (DIRECT): NEGATIVE

## 2016-04-21 NOTE — ED Triage Notes (Signed)
Pt endorses swollen lymphnode on right side of neck and generalized bodyaches. Pt states "I've lost a lot of weight in a short amount of time and I have tongue swelling and my throat is sore when I swallow" VSS.  

## 2016-04-21 NOTE — ED Triage Notes (Signed)
Pt endorses swollen lymphnode on right side of neck and generalized bodyaches. Pt states "I've lost a lot of weight in a short amount of time and I have tongue swelling and my throat is sore when I swallow" VSS.

## 2016-04-21 NOTE — ED Provider Notes (Signed)
[facility]EMER[facility]ENCY DEPT Provider Note   CSN: [id number] Arrival date & time: 04/21/16  2238     History   Chief Complaint Chief Complaint  Patient presents with  . Lymphadenopathy  . multiple complaints    HPI Kristie Sandoval is a 31 y.o. female.  Pt is a 31 y/o female with no significant medical problems who has presented multiple times in the last few weeks for vague complaints.  Today she c/o of diffuse aches and pains over the last few weeks.  No fever but pain of the tongue, mouth and feeling swollen lymph nodes in her neck.  She is concerned after looking on the internet for hepatitis, infectious diseases and cancer.  She has had pain with deep breathing and cough that is non-productive.  She takes no medications and denies heavy alcohol or drug use.  She tested neg for HIV last week.  She had abx about a month ago but no other abx.  No rashes or swelling.   The history is provided by the patient.    Past Medical History:  Diagnosis Date  . Chlamydia infection, current pregnancy 12/12   Test of cure negative in April  . GERD (gastroesophageal reflux disease)   . Gonorrhea, current pregnancy 12/12   Test of cure negative in April  . Headache(784.0)    no hx prior to preg  . Trichomonas     There are no active problems to display for this patient.   Past Surgical History:  Procedure Laterality Date  . NO PAST SURGERIES      OB History    Gravida Para Term Preterm AB Living   2 1 1  0 1 1   SAB TAB Ectopic Multiple Live Births   1 0 0 0 1       Home Medications    Prior to Admission medications   Medication Sig Start Date End Date Taking? Authorizing Provider  acetaminophen (TYLENOL) 500 MG tablet Take 500-1,000 mg by mouth every 6 (six) hours as needed. For pain    Historical Provider, MD  ibuprofen (ADVIL,MOTRIN) 600 MG tablet Take 1 tablet (600 mg total) by mouth every 6 (six) hours as needed. 04/09/16   Deirdre Colin Mulders Poe, CNM  levonorgestrel (MIRENA) 20  MCG/24HR IUD 1 each by Intrauterine route continuous. Started 2013.    Historical Provider, MD  loratadine (CLARITIN) 10 MG tablet Take 1 tablet (10 mg total) by mouth daily. 04/10/16   Dione Boozeavid Glick, MD  metroNIDAZOLE (FLAGYL) 500 MG tablet Take 1 tablet (500 mg total) by mouth 2 (two) times daily. 04/09/16   Deirdre C Poe, CNM  ondansetron (ZOFRAN) 4 MG tablet Take 1 tablet (4 mg total) by mouth every 6 (six) hours. 04/09/16   Deirdre C Poe, CNM  potassium chloride SA (K-DUR,KLOR-CON) 20 MEQ tablet Take 1 tablet (20 mEq total) by mouth 2 (two) times daily. 04/10/16   Dione Boozeavid Glick, MD  predniSONE (DELTASONE) 50 MG tablet Take 1 tablet (50 mg total) by mouth daily. 04/10/16   Dione Boozeavid Glick, MD  ranitidine (ZANTAC) 150 MG capsule Take 150 mg by mouth daily as needed for heartburn.    Historical Provider, MD    Family History Family History  Problem Relation Age of Onset  . Diabetes Maternal Grandmother   . Anesthesia problems Neg Hx   . Hypotension Neg Hx   . Malignant hyperthermia Neg Hx   . Pseudochol deficiency Neg Hx   . Other Neg Hx     Social  History Social History  Substance Use Topics  . Smoking status: Former Smoker    Packs/day: 0.00    Types: Cigarettes    Quit date: 02/18/2011  . Smokeless tobacco: Current User  . Alcohol use No     Allergies   Patient has no known allergies.   Review of Systems Review of Systems  All other systems reviewed and are negative.    Physical Exam Updated Vital Signs BP 117/78 (BP Location: Right Arm)   Pulse 81   Temp 98.3 F (36.8 C) (Oral)   Resp 16   Ht 5\' 4"  (1.626 m)   Wt 135 lb (61.2 kg)   LMP 03/23/2016   BMI 23.17 kg/m   Physical Exam  Constitutional: She is oriented to person, place, and time. She appears well-developed and well-nourished. No distress.  HENT:  Head: Normocephalic and atraumatic.  Right Ear: Tympanic membrane normal.  Left Ear: Tympanic membrane normal.  Thick white exudate present over the tongue and  palate  Eyes: Conjunctivae and EOM are normal. Pupils are equal, round, and reactive to light.  Neck: Normal range of motion. Neck supple.  Small mobile lymph node present in the right posterior cervical chain  Cardiovascular: Normal rate, regular rhythm and intact distal pulses.   No murmur heard. Pulmonary/Chest: Effort normal and breath sounds normal. No respiratory distress. She has no wheezes. She has no rales. She exhibits tenderness.  Diffuse tenderness with palpation of the chest and abd  Abdominal: Soft. She exhibits no distension. There is no tenderness. There is no rebound and no guarding.  Musculoskeletal: Normal range of motion. She exhibits no edema or tenderness.  Lymphadenopathy:    She has cervical adenopathy.  Neurological: She is alert and oriented to person, place, and time.  Skin: Skin is warm and dry. No rash noted. No erythema.  Psychiatric: She has a normal mood and affect. Her behavior is normal.  Nursing note and vitals reviewed.    ED Treatments / Results  Labs (all labs ordered are listed, but only abnormal results are displayed) Labs Reviewed  RAPID STREP SCREEN (NOT AT [facility])  CULTURE, GROUP A STREP ([facility])  CK  CBC WITH DIFFERENTIAL/PLATELET  COMPREHENSIVE METABOLIC PANEL  HEPATITIS PANEL, ACUTE    EKG  EKG Interpretation None       Radiology No results found.  Procedures Procedures (including critical care time)  Medications Ordered in ED Medications - No data to display   Initial Impression / Assessment and Plan / ED Course  I have reviewed the triage vital signs and the nursing notes.  Pertinent labs & imaging results that were available during my care of the patient were reviewed by me and considered in my medical decision making (see chart for details).    Pt here with multiple vague c/o of 2 ED visits in the last month for various complaints.  Low suspicion for acute illness other than possible thrush.  No fever, productive  cough or flu like sx.  Pt requesting testing for hepatitis, HPV and various other things she has seen on the internet.  She is well appearing with normal VS.  Rapid strep neg.  CXR wnl.  And labs without acute findings.  Pt's  HIV testing was recently neg per pt.  Final Clinical Impressions(s) / ED Diagnoses   Final diagnoses:  None    New Prescriptions New Prescriptions   No medications on file     Gwyneth Sprout, MD 04/22/16 [phone]

## 2016-04-22 LAB — COMPREHENSIVE METABOLIC PANEL
ALK PHOS: 42 U/L (ref 38–126)
ALT: 12 U/L — AB (ref 14–54)
AST: 13 U/L — ABNORMAL LOW (ref 15–41)
Albumin: 4.2 g/dL (ref 3.5–5.0)
Anion gap: 12 (ref 5–15)
BUN: 7 mg/dL (ref 6–20)
CALCIUM: 9.5 mg/dL (ref 8.9–10.3)
CO2: 23 mmol/L (ref 22–32)
Chloride: 103 mmol/L (ref 101–111)
Creatinine, Ser: 0.81 mg/dL (ref 0.44–1.00)
Glucose, Bld: 93 mg/dL (ref 65–99)
Potassium: 3.5 mmol/L (ref 3.5–5.1)
SODIUM: 138 mmol/L (ref 135–145)
Total Bilirubin: 0.5 mg/dL (ref 0.3–1.2)
Total Protein: 6.9 g/dL (ref 6.5–8.1)

## 2016-04-22 LAB — CK: Total CK: 72 U/L (ref 38–234)

## 2016-04-22 MED ORDER — NYSTATIN 100000 UNIT/ML MT SUSP: 500000.0000 [IU] | Freq: Four times a day (QID) | OROMUCOSAL | 0 refills | Status: DC

## 2016-04-23 LAB — CULTURE, GROUP A STREP (THRC)

## 2016-04-23 LAB — HEPATITIS PANEL, ACUTE
HCV Ab: <0.1 s/co ratio (ref 0.0–0.9)
HEP A IGM: NEGATIVE
HEP B C IGM: NEGATIVE
Hepatitis B Surface Ag: NEGATIVE

## 2016-04-25 ENCOUNTER — Encounter (HOSPITAL_COMMUNITY): Payer: Self-pay

## 2016-04-25 ENCOUNTER — Emergency Department (HOSPITAL_COMMUNITY)
Admission: EM | Admit: 2016-04-25 | Discharge: 2016-04-25 | Disposition: A | Discharge status: Home / Self Care | Payer: Medicaid Other | Attending: Emergency Medicine | Admitting: Emergency Medicine

## 2016-04-25 DIAGNOSIS — K0889 Other specified disorders of teeth and supporting structures: Secondary | ICD-10-CM | POA: Diagnosis present

## 2016-04-25 DIAGNOSIS — Z87891 Personal history of nicotine dependence: Secondary | ICD-10-CM | POA: Diagnosis not present

## 2016-04-25 MED ORDER — PENICILLIN V POTASSIUM 500 MG PO TABS: 500.0000 mg | ORAL_TABLET | Freq: Three times a day (TID) | ORAL | 0 refills | Status: DC

## 2016-04-25 MED ORDER — KETOROLAC TROMETHAMINE 60 MG/2ML IM SOLN
60.0000 mg | Freq: Once | INTRAMUSCULAR | Status: AC
  Administered 2016-04-25: 60 mg via INTRAMUSCULAR
  Filled 2016-04-25: qty 2

## 2016-04-25 MED ORDER — PENICILLIN V POTASSIUM 250 MG PO TABS
500.0000 mg | ORAL_TABLET | Freq: Once | ORAL | Status: AC
  Administered 2016-04-25: 500 mg via ORAL
  Filled 2016-04-25: qty 2

## 2016-04-25 MED ORDER — KETOROLAC TROMETHAMINE 10 MG PO TABS: 10.0000 mg | ORAL_TABLET | Freq: Four times a day (QID) | ORAL | 0 refills | Status: DC | PRN

## 2016-04-25 MED ORDER — KETOROLAC TROMETHAMINE 10 MG PO TABS
10.0000 mg | ORAL_TABLET | Freq: Once | ORAL | Status: DC
  Filled 2016-04-25: qty 1

## 2016-04-25 NOTE — ED Triage Notes (Signed)
Pt states that for the past four days she has been having R upper dental pain.

## 2016-04-25 NOTE — Discharge Instructions (Signed)
Take Penicillin as prescribed for infection coverage. You may take Toradol for pain, as needed. Do not take Toradol while taking with Motrin, Advil, ibuprofen, Aleve, or Naproxen. Follow up with your dentist.

## 2016-04-25 NOTE — ED Provider Notes (Signed)
[facility] Provider Note   CSN: [id number] Arrival date & time: 04/25/16  0227 By signing my name below, I, Levon Hedger, attest that this documentation has been prepared under the direction and in the presence of non-physician practitioner, Antony Madura, PA-C. Electronically Signed: Levon Hedger, Scribe. 04/25/2016. 3:28 AM.    History   Chief Complaint Chief Complaint  Patient presents with  . Dental Pain    HPI Kristie Sandoval is a 31 y.o. female who presents to the Emergency Department complaining of constant, throbbing right upper dental pain onset four days ago. Pt has taken ibuprofen with no relief of pain. Per pt, she was given Toradol when she had dental pain in the past which significantly relieved her pain. She denies any drainage, fever, or any other associated symptoms. Pt has no other complaints or symptoms at this time. No known allergies to abx.   The history is provided by the patient. No language interpreter was used.   Past Medical History:  Diagnosis Date  . Chlamydia infection, current pregnancy 12/12   Test of cure negative in April  . GERD (gastroesophageal reflux disease)   . Gonorrhea, current pregnancy 12/12   Test of cure negative in April  . Headache(784.0)    no hx prior to preg  . Trichomonas     There are no active problems to display for this patient.   Past Surgical History:  Procedure Laterality Date  . NO PAST SURGERIES      OB History    Gravida Para Term Preterm AB Living   2 1 1  0 1 1   SAB TAB Ectopic Multiple Live Births   1 0 0 0 1       Home Medications    Prior to Admission medications   Medication Sig Start Date End Date Taking? Authorizing Provider  acetaminophen (TYLENOL) 500 MG tablet Take 500-1,000 mg by mouth every 6 (six) hours as needed. For pain    Historical Provider, MD  ibuprofen (ADVIL,MOTRIN) 600 MG tablet Take 1 tablet (600 mg total) by mouth every 6 (six) hours as needed. 04/09/16   Deirdre Colin Mulders, CNM  ketorolac (TORADOL) 10 MG tablet Take 1 tablet (10 mg total) by mouth every 6 (six) hours as needed for moderate pain or severe pain. 04/25/16   Antony Madura, PA-C  levonorgestrel (MIRENA) 20 MCG/24HR IUD 1 each by Intrauterine route continuous. Started 2013.    Historical Provider, MD  loratadine (CLARITIN) 10 MG tablet Take 1 tablet (10 mg total) by mouth daily. 04/10/16   Dione Booze, MD  metroNIDAZOLE (FLAGYL) 500 MG tablet Take 1 tablet (500 mg total) by mouth 2 (two) times daily. 04/09/16   Deirdre C Poe, CNM  nystatin (MYCOSTATIN) 100000 UNIT/ML suspension Take 5 mLs (500,000 Units total) by mouth 4 (four) times daily. 04/22/16   Antony Madura, PA-C  ondansetron (ZOFRAN) 4 MG tablet Take 1 tablet (4 mg total) by mouth every 6 (six) hours. 04/09/16   Deirdre Colin Mulders, CNM  penicillin v potassium (VEETID) 500 MG tablet Take 1 tablet (500 mg total) by mouth 3 (three) times daily. 04/25/16   Antony Madura, PA-C  potassium chloride SA (K-DUR,KLOR-CON) 20 MEQ tablet Take 1 tablet (20 mEq total) by mouth 2 (two) times daily. 04/10/16   Dione Booze, MD  predniSONE (DELTASONE) 50 MG tablet Take 1 tablet (50 mg total) by mouth daily. 04/10/16   Dione Booze, MD  ranitidine (ZANTAC) 150 MG capsule Take 150 mg by  mouth daily as needed for heartburn.    Historical Provider, MD    Family History Family History  Problem Relation Age of Onset  . Diabetes Maternal Grandmother   . Anesthesia problems Neg Hx   . Hypotension Neg Hx   . Malignant hyperthermia Neg Hx   . Pseudochol deficiency Neg Hx   . Other Neg Hx     Social History Social History  Substance Use Topics  . Smoking status: Former Smoker    Packs/day: 0.00    Types: Cigarettes    Quit date: 02/18/2011  . Smokeless tobacco: Current User  . Alcohol use No     Allergies   Patient has no known allergies.   Review of Systems Review of Systems 10 systems reviewed and all are negative for acute change except as noted in the  HPI.   Physical Exam Updated Vital Signs BP 125/68 (BP Location: Right Arm)   Pulse (!) 41   Temp 98.3 F (36.8 C) (Oral)   Resp 18   Ht 5\' 5"  (1.651 m)   Wt 61.7 kg   LMP 04/24/2016   SpO2 100%   BMI 22.63 kg/m   Physical Exam  Constitutional: She is oriented to person, place, and time. She appears well-developed and well-nourished. No distress.  Nontoxic appearing and in no distress.  HENT:  Head: Normocephalic and atraumatic.  No trismus. Grossly abnormal dentition with dental caries. There is a dental fracture noted to the right upper first premolar with associated tenderness to palpation. No gingival swelling or fluctuance. Patient tolerating secretions without difficulty.  Eyes: Conjunctivae and EOM are normal. No scleral icterus.  Neck: Normal range of motion.  No nuchal rigidity or meningismus  Pulmonary/Chest: Effort normal. No respiratory distress. She has no wheezes.  Respirations even and unlabored.  Musculoskeletal: Normal range of motion.  Neurological: She is alert and oriented to person, place, and time.  Skin: Skin is warm and dry. No rash noted. She is not diaphoretic. No erythema. No pallor.  Psychiatric: She has a normal mood and affect. Her behavior is normal.  Nursing note and vitals reviewed.    ED Treatments / Results  DIAGNOSTIC STUDIES:  Oxygen Saturation is 100% on RA, normal by my interpretation.    COORDINATION OF CARE:  3:14 AM Discussed treatment plan with pt at bedside and pt agreed to plan.   Labs (all labs ordered are listed, but only abnormal results are displayed) Labs Reviewed - No data to display  EKG  EKG Interpretation None       Radiology No results found.  Procedures Procedures (including critical care time)  Medications Ordered in ED Medications  penicillin v potassium (VEETID) tablet 500 mg (500 mg Oral Given 04/25/16 0332)  ketorolac (TORADOL) injection 60 mg (60 mg Intramuscular Given 04/25/16 0333)      Initial Impression / Assessment and Plan / ED Course  I have reviewed the triage vital signs and the nursing notes.  Pertinent labs & imaging results that were available during my care of the patient were reviewed by me and considered in my medical decision making (see chart for details).     Patient with toothache. No gross abscess. Exam unconcerning for Ludwig's angina or spread of infection. Will treat with penicillin and pain medicine. Urged patient to follow-up with dentist. Return precautions discussed and provided. Patient discharged in stable condition with no unaddressed concerns.   Final Clinical Impressions(s) / ED Diagnoses   Final diagnoses:  Pain, dental  New Prescriptions Discharge Medication List as of 04/25/2016  3:19 AM    START taking these medications   Details  ketorolac (TORADOL) 10 MG tablet Take 1 tablet (10 mg total) by mouth every 6 (six) hours as needed for moderate pain or severe pain., Starting Mon 04/25/2016, Print    penicillin v potassium (VEETID) 500 MG tablet Take 1 tablet (500 mg total) by mouth 3 (three) times daily., Starting Mon 04/25/2016, Print       I personally performed the services described in this documentation, which was scribed in my presence. The recorded information has been reviewed and is accurate.      Antony MaduraKelly Vanshika Jastrzebski, PA-C 04/25/16 0400    Dione Boozeavid Glick, MD 04/25/16 [id number]

## 2016-04-25 NOTE — ED Notes (Signed)
Pt departed in NAD, refused use of wheelchair.  

## 2016-04-27 ENCOUNTER — Encounter (HOSPITAL_COMMUNITY): Payer: Self-pay

## 2016-04-27 ENCOUNTER — Inpatient Hospital Stay (HOSPITAL_COMMUNITY)
Admission: AD | Admit: 2016-04-27 | Discharge: 2016-04-27 | Disposition: A | Discharge status: Home / Self Care | Payer: Medicaid Other | Source: Ambulatory Visit | Attending: Obstetrics & Gynecology | Admitting: Obstetrics & Gynecology

## 2016-04-27 DIAGNOSIS — F411 Generalized anxiety disorder: Secondary | ICD-10-CM

## 2016-04-27 DIAGNOSIS — K219 Gastro-esophageal reflux disease without esophagitis: Secondary | ICD-10-CM | POA: Diagnosis not present

## 2016-04-27 DIAGNOSIS — Z87891 Personal history of nicotine dependence: Secondary | ICD-10-CM | POA: Insufficient documentation

## 2016-04-27 DIAGNOSIS — R599 Enlarged lymph nodes, unspecified: Secondary | ICD-10-CM | POA: Diagnosis present

## 2016-04-27 DIAGNOSIS — Z975 Presence of (intrauterine) contraceptive device: Secondary | ICD-10-CM | POA: Diagnosis not present

## 2016-04-27 DIAGNOSIS — Z79899 Other long term (current) drug therapy: Secondary | ICD-10-CM | POA: Insufficient documentation

## 2016-04-27 DIAGNOSIS — Z711 Person with feared health complaint in whom no diagnosis is made: Secondary | ICD-10-CM | POA: Diagnosis not present

## 2016-04-27 LAB — CBC WITH DIFFERENTIAL/PLATELET
BASOS ABS: 0 10*3/uL (ref 0.0–0.1)
Basophils Relative: 0 %
EOS ABS: 0 10*3/uL (ref 0.0–0.7)
Eosinophils Relative: 1 %
HCT: 36.2 % (ref 36.0–46.0)
Hemoglobin: 12.9 g/dL (ref 12.0–15.0)
LYMPHS PCT: 36 %
Lymphs Abs: 2.7 10*3/uL (ref 0.7–4.0)
MCH: 32.4 pg (ref 26.0–34.0)
MCHC: 35.6 g/dL (ref 30.0–36.0)
MCV: 91 fL (ref 78.0–100.0)
Monocytes Absolute: 0.4 10*3/uL (ref 0.1–1.0)
Monocytes Relative: 5 %
Neutro Abs: 4.5 10*3/uL (ref 1.7–7.7)
Neutrophils Relative %: 58 %
PLATELETS: 259 10*3/uL (ref 150–400)
RBC: 3.98 MIL/uL (ref 3.87–5.11)
RDW: 13.3 % (ref 11.5–15.5)
WBC: 7.6 10*3/uL (ref 4.0–10.5)

## 2016-04-27 NOTE — MAU Note (Signed)
Urine in lab 

## 2016-04-27 NOTE — MAU Note (Signed)
Read up, saw on my chart that her lyphocytes were high, so she thinks she might have an infection.  Also she thinks she has a swollen node in her neck

## 2016-04-27 NOTE — MAU Provider Note (Signed)
Chief Complaint: No chief complaint on file.   None     SUBJECTIVE HPI: Kristie Sandoval is a 31 y.o. G2P1011 who presents to maternity admissions reporting concerns that she may have HIV but her testing has been negative. She saw in her medical chart that her lymphocytes were slightly elevated and she has a new swollen lymph node x2 weeks in the right side of her neck and she read that these could be signs of HIV.  This is a recurrent symptom and she reported this on previous ED visit on 04/21/16.  She reports her last sexual encounter was 2 months ago and she denies any recent intercourse.  She was evaluated in MAU and in the ED recently and had negative STD testing including RPR and HIV. She was treated with abx for a dental infection on 04/25/16 but denies any current symptoms.  She denies fever/chills or any URI symptoms. She denies known exposure to respiratory or GI illness.  She has not tried any treatments for the swollen lymph node, nothing makes it better or worse. She denies abdominal pain, vaginal bleeding, vaginal itching/burning, urinary symptoms, h/a, dizziness, n/v, or fever/chills.     HPI  Past Medical History:  Diagnosis Date  . Chlamydia infection, current pregnancy 12/12   Test of cure negative in April  . GERD (gastroesophageal reflux disease)   . Gonorrhea, current pregnancy 12/12   Test of cure negative in April  . Headache(784.0)    no hx prior to preg  . Trichomonas    Past Surgical History:  Procedure Laterality Date  . NO PAST SURGERIES     Social History   Social History  . Marital status: Single    Spouse name: N/A  . Number of children: N/A  . Years of education: N/A   Occupational History  . Not on file.   Social History Main Topics  . Smoking status: Former Smoker    Packs/day: 0.00    Types: Cigarettes    Quit date: 02/18/2011  . Smokeless tobacco: Current User  . Alcohol use No  . Drug use: No     Comment: no longer  . Sexual activity:  Yes    Birth control/ protection: IUD   Other Topics Concern  . Not on file   Social History Narrative  . No narrative on file   No current facility-administered medications on file prior to encounter.    Current Outpatient Prescriptions on File Prior to Encounter  Medication Sig Dispense Refill  . ibuprofen (ADVIL,MOTRIN) 600 MG tablet Take 1 tablet (600 mg total) by mouth every 6 (six) hours as needed. 30 tablet 1  . ondansetron (ZOFRAN) 4 MG tablet Take 1 tablet (4 mg total) by mouth every 6 (six) hours. 6 tablet 0  . penicillin v potassium (VEETID) 500 MG tablet Take 1 tablet (500 mg total) by mouth 3 (three) times daily. 30 tablet 0  . potassium chloride SA (K-DUR,KLOR-CON) 20 MEQ tablet Take 1 tablet (20 mEq total) by mouth 2 (two) times daily. 15 tablet 0  . ketorolac (TORADOL) 10 MG tablet Take 1 tablet (10 mg total) by mouth every 6 (six) hours as needed for moderate pain or severe pain. 12 tablet 0  . levonorgestrel (MIRENA) 20 MCG/24HR IUD 1 each by Intrauterine route continuous. Started 2013.    . predniSONE (DELTASONE) 50 MG tablet Take 1 tablet (50 mg total) by mouth daily. 5 tablet 0   No Known Allergies  ROS:  Review  of Systems  Constitutional: Negative for chills, fatigue and fever.  Respiratory: Negative for shortness of breath.   Cardiovascular: Negative for chest pain.  Genitourinary: Negative for difficulty urinating, dysuria, flank pain, pelvic pain, vaginal bleeding, vaginal discharge and vaginal pain.  Neurological: Negative for dizziness and headaches.  Hematological: Positive for adenopathy.  Psychiatric/Behavioral: The patient is nervous/anxious.      I have reviewed patient's Past Medical Hx, Surgical Hx, Family Hx, Social Hx, medications and allergies.   Physical Exam   Patient Vitals for the past 24 hrs:  BP Temp Temp src Pulse Resp Weight  04/27/16 1533 118/85 - - 92 18 -  04/27/16 1410 109/75 98.3 F (36.8 C) Oral 89 16 137 lb 4 oz (62.3 kg)    Constitutional: Well-developed, well-nourished female in no acute distress.  HEENT: No evidence of lymphadenopathy on today's exam Cardiovascular: normal rate Respiratory: normal effort GI: Abd soft, non-tender. Pos BS x 4 MS: Extremities nontender, no edema, normal ROM Neurologic: Alert and oriented x 4.  GU: Neg CVAT.  LAB RESULTS Results for orders placed or performed during the hospital encounter of 04/27/16 (from the past 24 hour(s))  CBC with Differential/Platelet     Status: None   Collection Time: 04/27/16  2:40 PM  Result Value Ref Range   WBC 7.6 4.0 - 10.5 K/uL   RBC 3.98 3.87 - 5.11 MIL/uL   Hemoglobin 12.9 12.0 - 15.0 g/dL   HCT 16.1 09.6 - 04.5 %   MCV 91.0 78.0 - 100.0 fL   MCH 32.4 26.0 - 34.0 pg   MCHC 35.6 30.0 - 36.0 g/dL   RDW 40.9 81.1 - 91.4 %   Platelets 259 150 - 400 K/uL   Neutrophils Relative % 58 %   Neutro Abs 4.5 1.7 - 7.7 K/uL   Lymphocytes Relative 36 %   Lymphs Abs 2.7 0.7 - 4.0 K/uL   Monocytes Relative 5 %   Monocytes Absolute 0.4 0.1 - 1.0 K/uL   Eosinophils Relative 1 %   Eosinophils Absolute 0.0 0.0 - 0.7 K/uL   Basophils Relative 0 %   Basophils Absolute 0.0 0.0 - 0.1 K/uL       IMAGING   MAU Management/MDM: Ordered labs and reviewed results.  Reviewed normal CBC with diff results with pt.  HIV test pending. Recommended pt f/u with primary care if she feels like lymph node remains swollen.  Pt stable at time of discharge.  ASSESSMENT 1. Anxiety state   2. Physically well but worried     PLAN Discharge home  Allergies as of 04/27/2016   No Known Allergies     Medication List    STOP taking these medications   loratadine 10 MG tablet Commonly known as:  CLARITIN   metroNIDAZOLE 500 MG tablet Commonly known as:  FLAGYL   nystatin 100000 UNIT/ML suspension Commonly known as:  MYCOSTATIN     TAKE these medications   ibuprofen 600 MG tablet Commonly known as:  ADVIL,MOTRIN Take 1 tablet (600 mg total) by mouth  every 6 (six) hours as needed.   ketorolac 10 MG tablet Commonly known as:  TORADOL Take 1 tablet (10 mg total) by mouth every 6 (six) hours as needed for moderate pain or severe pain.   levonorgestrel 20 MCG/24HR IUD Commonly known as:  MIRENA 1 each by Intrauterine route continuous. Started 2013.   ondansetron 4 MG tablet Commonly known as:  ZOFRAN Take 1 tablet (4 mg total) by mouth every 6 (six)  hours.   penicillin v potassium 500 MG tablet Commonly known as:  VEETID Take 1 tablet (500 mg total) by mouth 3 (three) times daily.   potassium chloride SA 20 MEQ tablet Commonly known as:  K-DUR,KLOR-CON Take 1 tablet (20 mEq total) by mouth 2 (two) times daily.   predniSONE 50 MG tablet Commonly known as:  DELTASONE Take 1 tablet (50 mg total) by mouth daily.      Follow-up Information    Primary care provider Follow up.           Sharen CounterLisa Leftwich-Kirby Certified Nurse-Midwife 04/27/2016  3:42 PM

## 2016-04-28 LAB — HIV ANTIBODY (ROUTINE TESTING W REFLEX): HIV Screen 4th Generation wRfx: NONREACTIVE

## 2016-05-19 ENCOUNTER — Emergency Department (HOSPITAL_COMMUNITY)
Admission: EM | Admit: 2016-05-19 | Discharge: 2016-05-19 | Disposition: A | Discharge status: Home / Self Care | Payer: Medicaid Other | Attending: Emergency Medicine | Admitting: Emergency Medicine

## 2016-05-19 ENCOUNTER — Encounter (HOSPITAL_COMMUNITY): Payer: Self-pay | Admitting: *Deleted

## 2016-05-19 DIAGNOSIS — F17210 Nicotine dependence, cigarettes, uncomplicated: Secondary | ICD-10-CM | POA: Insufficient documentation

## 2016-05-19 DIAGNOSIS — K0889 Other specified disorders of teeth and supporting structures: Secondary | ICD-10-CM | POA: Insufficient documentation

## 2016-05-19 MED ORDER — AMOXICILLIN-POT CLAVULANATE 875-125 MG PO TABS: 1.0000 | ORAL_TABLET | Freq: Two times a day (BID) | ORAL | 0 refills | Status: DC

## 2016-05-19 NOTE — ED Triage Notes (Signed)
Pt to ED for multiple complaints. Reports being seen here a month ago for headache, sores on the roof of her mouth, bilateral ear pain, and swelling to L side of face.

## 2016-05-19 NOTE — ED Provider Notes (Signed)
[facility]-EMERGENCY DEPT Provider Note   CSN: [id number] Arrival date & time: 05/19/16  0341  By signing my name below, I, Bing Neighbors., attest that this documentation has been prepared under the direction and in the presence of No att. providers found. Electronically signed: Bing Neighbors., ED Scribe. 05/19/16. 3:58 AM.   History   Chief Complaint Chief Complaint  Patient presents with  . Mouth Lesions    HPI Kristie Sandoval is a 31 y.o. female who presents to the Emergency Department complaining of worsening, mild dental pain with sudden onset x2 days. Pt states that she has had this same dental pain for the past x21 days, but reports the pain worsening in the past x2 days after a root canal. She reports cough, neck pain, generalized body aches, sore throat, mouth lesions. Pt reportedly took penicillin for the thrush in her mouth, and tylenol for pain with no relief. She denies rhinorrhea, congestion, sneezing and fever.     The history is provided by the patient. No language interpreter was used.  Mouth Lesions    Past Medical History:  Diagnosis Date  . Chlamydia infection, current pregnancy 12/12   Test of cure negative in April  . GERD (gastroesophageal reflux disease)   . Gonorrhea, current pregnancy 12/12   Test of cure negative in April  . Headache(784.0)    no hx prior to preg  . Trichomonas     There are no active problems to display for this patient.   Past Surgical History:  Procedure Laterality Date  . NO PAST SURGERIES      OB History    Gravida Para Term Preterm AB Living   2 1 1  0 1 1   SAB TAB Ectopic Multiple Live Births   1 0 0 0 1       Home Medications    Prior to Admission medications   Medication Sig Start Date End Date Taking? Authorizing Provider  ibuprofen (ADVIL,MOTRIN) 600 MG tablet Take 1 tablet (600 mg total) by mouth every 6 (six) hours as needed. 04/09/16   Deirdre Colin Mulders, CNM  ketorolac (TORADOL) 10 MG tablet  Take 1 tablet (10 mg total) by mouth every 6 (six) hours as needed for moderate pain or severe pain. 04/25/16   Antony Madura, PA-C  levonorgestrel (MIRENA) 20 MCG/24HR IUD 1 each by Intrauterine route continuous. Started 2013.    Historical Provider, MD  ondansetron (ZOFRAN) 4 MG tablet Take 1 tablet (4 mg total) by mouth every 6 (six) hours. 04/09/16   Deirdre Colin Mulders, CNM  penicillin v potassium (VEETID) 500 MG tablet Take 1 tablet (500 mg total) by mouth 3 (three) times daily. 04/25/16   Antony Madura, PA-C  potassium chloride SA (K-DUR,KLOR-CON) 20 MEQ tablet Take 1 tablet (20 mEq total) by mouth 2 (two) times daily. 04/10/16   Dione Booze, MD  predniSONE (DELTASONE) 50 MG tablet Take 1 tablet (50 mg total) by mouth daily. 04/10/16   Dione Booze, MD    Family History Family History  Problem Relation Age of Onset  . Diabetes Maternal Grandmother   . Anesthesia problems Neg Hx   . Hypotension Neg Hx   . Malignant hyperthermia Neg Hx   . Pseudochol deficiency Neg Hx   . Other Neg Hx     Social History Social History  Substance Use Topics  . Smoking status: Current Some Day Smoker    Packs/day: 0.00    Types: Cigarettes  Last attempt to quit: 02/18/2011  . Smokeless tobacco: Current User  . Alcohol use No     Allergies   Patient has no known allergies.   Review of Systems Review of Systems  HENT: Positive for mouth sores.     A complete 10 system review of systems was obtained and all systems are negative except as noted in the HPI and PMH.   Physical Exam Updated Vital Signs BP 136/78   Pulse 82   Temp 98 F (36.7 C) (Oral)   Resp 16   LMP 05/18/2016   SpO2 100%   Physical Exam  Constitutional: She is oriented to person, place, and time. She appears well-developed and well-nourished. No distress.  HENT:  Head: Normocephalic and atraumatic.  Nose: Nose normal.  Mouth/Throat: Oropharynx is clear and moist. No oropharyngeal exudate.  Overall poor dentition.  Swelling  seen over the L upper gums.  No lesions in the mouth.  Eyes: Conjunctivae and EOM are normal. Pupils are equal, round, and reactive to light. No scleral icterus.  Neck: Normal range of motion. Neck supple. No JVD present. No tracheal deviation present. No thyromegaly present.  Cardiovascular: Normal rate, regular rhythm and normal heart sounds.  Exam reveals no gallop and no friction rub.   No murmur heard. Pulmonary/Chest: Effort normal and breath sounds normal. No respiratory distress. She has no wheezes. She exhibits no tenderness.  Abdominal: Soft. Bowel sounds are normal. She exhibits no distension and no mass. There is no tenderness. There is no rebound and no guarding.  Musculoskeletal: Normal range of motion. She exhibits no edema or tenderness.  Lymphadenopathy:    She has no cervical adenopathy.  Neurological: She is alert and oriented to person, place, and time. No cranial nerve deficit. She exhibits normal muscle tone.  Skin: Skin is warm and dry. No rash noted. No erythema. No pallor.  Nursing note and vitals reviewed.    ED Treatments / Results   DIAGNOSTIC STUDIES: Oxygen Saturation is 100% on RA, normal by my interpretation.   COORDINATION OF CARE: 3:58 AM-Discussed next steps with pt. Pt verbalized understanding and is agreeable with the plan.    Labs (all labs ordered are listed, but only abnormal results are displayed) Labs Reviewed - No data to display  EKG  EKG Interpretation None       Radiology No results found.  Procedures Procedures (including critical care time)  Medications Ordered in ED Medications - No data to display   Initial Impression / Assessment and Plan / ED Course  I have reviewed the triage vital signs and the nursing notes.  Pertinent labs & imaging results that were available during my care of the patient were reviewed by me and considered in my medical decision making (see chart for details).     Patient presents to the  ED for pain in her mouth that spreads up her face and into her ears. She states the pain improved last time with penicillin but then came back. Will upgrade to augmentin.  Patient is also concerned for oral cancer and tonsillitis after looking this up on the Internet. She is concerned she needs flagyl for treatment. She was reassured and advised to go back to her dentist for oral cancer screening. She demonstrates good understanding of the plan.  She appears well and in NAD. VS remain within her normal limits and she is safe for DC.     Final Clinical Impressions(s) / ED Diagnoses   Final diagnoses:  None    New Prescriptions New Prescriptions   No medications on file   I personally performed the services described in this documentation, which was scribed in my presence. The recorded information has been reviewed and is accurate.       Tomasita CrumbleAdeleke Loribeth Katich, MD 05/19/16 [phone]

## 2016-05-19 NOTE — ED Notes (Signed)
Pt got very upset at dc time, requesting for pain meds prescriptions and referral to dermatology, started getting verbally aggressive, CN notified and EDP notified, security called to escort pt out to waiting room.

## 2017-01-28 ENCOUNTER — Emergency Department (HOSPITAL_COMMUNITY): Payer: Medicaid Other

## 2017-01-28 ENCOUNTER — Encounter (HOSPITAL_COMMUNITY): Payer: Self-pay | Admitting: Emergency Medicine

## 2017-01-28 ENCOUNTER — Emergency Department (HOSPITAL_COMMUNITY)
Admission: EM | Admit: 2017-01-28 | Discharge: 2017-01-28 | Disposition: A | Discharge status: Home / Self Care | Payer: Medicaid Other | Attending: Emergency Medicine | Admitting: Emergency Medicine

## 2017-01-28 DIAGNOSIS — N644 Mastodynia: Secondary | ICD-10-CM | POA: Insufficient documentation

## 2017-01-28 DIAGNOSIS — R079 Chest pain, unspecified: Secondary | ICD-10-CM

## 2017-01-28 DIAGNOSIS — Z791 Long term (current) use of non-steroidal anti-inflammatories (NSAID): Secondary | ICD-10-CM | POA: Diagnosis not present

## 2017-01-28 DIAGNOSIS — R599 Enlarged lymph nodes, unspecified: Secondary | ICD-10-CM | POA: Insufficient documentation

## 2017-01-28 DIAGNOSIS — F17210 Nicotine dependence, cigarettes, uncomplicated: Secondary | ICD-10-CM | POA: Diagnosis not present

## 2017-01-28 DIAGNOSIS — R0789 Other chest pain: Secondary | ICD-10-CM | POA: Diagnosis not present

## 2017-01-28 DIAGNOSIS — Z79899 Other long term (current) drug therapy: Secondary | ICD-10-CM | POA: Insufficient documentation

## 2017-01-28 DIAGNOSIS — J029 Acute pharyngitis, unspecified: Secondary | ICD-10-CM | POA: Diagnosis present

## 2017-01-28 LAB — BASIC METABOLIC PANEL
Anion gap: 7 (ref 5–15)
BUN: 11 mg/dL (ref 6–20)
CALCIUM: 9.2 mg/dL (ref 8.9–10.3)
CO2: 24 mmol/L (ref 22–32)
CREATININE: 0.85 mg/dL (ref 0.44–1.00)
Chloride: 105 mmol/L (ref 101–111)
GFR calc non Af Amer: >60 mL/min (ref >60)
GLUCOSE: 99 mg/dL (ref 65–99)
Potassium: 3.6 mmol/L (ref 3.5–5.1)
Sodium: 136 mmol/L (ref 135–145)

## 2017-01-28 LAB — I-STAT BETA HCG BLOOD, ED (MC, WL, AP ONLY): I-stat hCG, quantitative: <5 m[IU]/mL (ref <5)

## 2017-01-28 LAB — CBC
HCT: 39.3 % (ref 36.0–46.0)
Hemoglobin: 13.4 g/dL (ref 12.0–15.0)
MCH: 31.9 pg (ref 26.0–34.0)
MCHC: 34.1 g/dL (ref 30.0–36.0)
MCV: 93.6 fL (ref 78.0–100.0)
PLATELETS: 288 10*3/uL (ref 150–400)
RBC: 4.2 MIL/uL (ref 3.87–5.11)
RDW: 13.5 % (ref 11.5–15.5)
WBC: 11.9 10*3/uL — ABNORMAL HIGH (ref 4.0–10.5)

## 2017-01-28 LAB — I-STAT TROPONIN, ED: TROPONIN I, POC: 0.01 ng/mL (ref 0.00–0.08)

## 2017-01-28 LAB — MONONUCLEOSIS SCREEN: Mono Screen: NEGATIVE

## 2017-01-28 LAB — RAPID STREP SCREEN (MED CTR MEBANE ONLY): Streptococcus, Group A Screen (Direct): NEGATIVE

## 2017-01-28 MED ORDER — KETOROLAC TROMETHAMINE 30 MG/ML IJ SOLN: 30.0000 mg | Freq: Once | INTRAMUSCULAR | Status: DC

## 2017-01-28 MED ORDER — KETOROLAC TROMETHAMINE 30 MG/ML IJ SOLN
30.0000 mg | Freq: Once | INTRAMUSCULAR | Status: AC
  Administered 2017-01-28: 30 mg via INTRAMUSCULAR
  Filled 2017-01-28: qty 1

## 2017-01-28 MED ORDER — NAPROXEN 500 MG PO TABS: 500.0000 mg | ORAL_TABLET | Freq: Two times a day (BID) | ORAL | 0 refills | Status: DC

## 2017-01-28 NOTE — ED Triage Notes (Signed)
Pt states chest pain for 1 week that comes and goes, to bilateral breasts. Pain is worse with deep breathes. PT is "constant and comes and goes." Pt also states nipples are more sensitive. States she has mirena. Chest pain 10/10, pt also has pain to head "when I touch my head it hurts." multiple complaints.

## 2017-01-28 NOTE — ED Provider Notes (Signed)
[facility]   CSN: [id number] Arrival date & time: 01/28/17  1316     History   Chief Complaint Chief Complaint  Patient presents with  . Chest Pain  . Sore Throat    HPI Kristie Sandoval is a 31 y.o. female.  HPI   Patient is a 31 year old female with a history of headaches, GERD, gonorrhea, chlamydia presenting with multiple complaints.  Patient's most immediate complaint is that she has had a sore throat for the past 3 days.  Patient feels that she has had some swelling in the pharynx and some pain with swallowing.  Patient denies cough, fever, chill, congestion.  Patient has had a protected oral intercourse.  Patient is currently sexually active with one female and one female sexual partner.  Patient is denying any vaginal discharge, dysuria, lower abdominal pain, or vaginal pain or itching.Patient reports that she is having some bilateral flank pain more on right than left times approximately 3 months.  Patient was evaluated in January 2018 for renal stones and found to have a non-obstructing stone in the right renal cyst.  Patient reports that this pain is intermittent.  Patient also reports that she is having some chest discomfort with deep breathing behind her breasts and it has been is more substernally with the last 2-3 weeks.  Her breasts have also been tender for the past 2-3 weeks.  Patient reports she is on Mirena IUD, but is scheduled to have it out soon.  Patient is currently on her menstrual cycle.  Patient also reports she has a headache that is painful to palpation where she presses on her head she will have pain for approximately the next 30 minutes.  Patient is additionally concerned about a lymph node in the posterior cervical chain that has been there for a year unchanged.  Patient is concerned about cancers.  Patient denies any unilateral leg swelling, palpitations, recent immobilization, history of DVT/PE, or hormone  use.  Patient denies any family history of early MI.    Past Medical History:  Diagnosis Date  . Chlamydia infection, current pregnancy 12/12   Test of cure negative in April  . GERD (gastroesophageal reflux disease)   . Gonorrhea, current pregnancy 12/12   Test of cure negative in April  . Headache(784.0)    no hx prior to preg  . Trichomonas     There are no active problems to display for this patient.   Past Surgical History:  Procedure Laterality Date  . NO PAST SURGERIES      OB History    Gravida Para Term Preterm AB Living   2 1 1  0 1 1   SAB TAB Ectopic Multiple Live Births   1 0 0 0 1       Home Medications    Prior to Admission medications   Medication Sig Start Date End Date Taking? Authorizing Provider  amoxicillin-clavulanate (AUGMENTIN) 875-125 MG tablet Take 1 tablet by mouth 2 (two) times daily. One po bid x 7 days 05/19/16   Tomasita Crumbleni, Adeleke, MD  ibuprofen (ADVIL,MOTRIN) 600 MG tablet Take 1 tablet (600 mg total) by mouth every 6 (six) hours as needed. 04/09/16   Poe, Deirdre C, CNM  ketorolac (TORADOL) 10 MG tablet Take 1 tablet (10 mg total) by mouth every 6 (six) hours as needed for moderate pain or severe pain. 04/25/16   Antony MaduraHumes, Kelly, PA-C  levonorgestrel (MIRENA) 20 MCG/24HR IUD 1 each by Intrauterine route  continuous. Started 2013.    [provider]  naproxen (NAPROSYN) 500 MG tablet Take 1 tablet (500 mg total) 2 (two) times daily by mouth. 01/28/17   Aviva KluverMurray, Jericca Russett B, PA-C  ondansetron (ZOFRAN) 4 MG tablet Take 1 tablet (4 mg total) by mouth every 6 (six) hours. 04/09/16   Poe, Deirdre C, CNM  penicillin v potassium (VEETID) 500 MG tablet Take 1 tablet (500 mg total) by mouth 3 (three) times daily. 04/25/16   Antony MaduraHumes, Kelly, PA-C  potassium chloride SA (K-DUR,KLOR-CON) 20 MEQ tablet Take 1 tablet (20 mEq total) by mouth 2 (two) times daily. 04/10/16   Dione BoozeGlick, David, MD  predniSONE (DELTASONE) 50 MG tablet Take 1 tablet (50 mg total) by mouth daily.  04/10/16   Dione BoozeGlick, David, MD    Family History Family History  Problem Relation Age of Onset  . Diabetes Maternal Grandmother   . Anesthesia problems Neg Hx   . Hypotension Neg Hx   . Malignant hyperthermia Neg Hx   . Pseudochol deficiency Neg Hx   . Other Neg Hx     Social History Social History   Tobacco Use  . Smoking status: Current Some Day Smoker    Packs/day: 0.00    Types: Cigarettes    Last attempt to quit: 02/18/2011    Years since quitting: 5.9  . Smokeless tobacco: Current User  Substance Use Topics  . Alcohol use: No  . Drug use: No    Comment: no longer     Allergies   Patient has no known allergies.   Review of Systems Review of Systems  Constitutional: Negative for chills and fever.  HENT: Positive for sore throat. Negative for congestion and rhinorrhea.   Respiratory: Positive for chest tightness. Negative for cough and shortness of breath.   Cardiovascular: Positive for chest pain. Negative for palpitations and leg swelling.  Gastrointestinal: Negative for abdominal pain, nausea and vomiting.  Genitourinary: Negative for dysuria, frequency, vaginal bleeding, vaginal discharge and vaginal pain.  Musculoskeletal: Positive for arthralgias, back pain and myalgias.  Skin: Negative for rash.  Neurological: Positive for headaches.  Hematological: Positive for adenopathy.     Physical Exam Updated Vital Signs BP 101/84   Pulse 73   Temp 98.7 F (37.1 C) (Oral)   Resp 16   Ht 5\' 6"  (1.676 m)   Wt 68 kg (150 lb)   LMP 01/28/2017   SpO2 99%   BMI 24.21 kg/m   Physical Exam  Constitutional: She appears well-developed and well-nourished. No distress.  HENT:  Head: Normocephalic and atraumatic.  Mouth/Throat: Oropharynx is clear and moist.  Normal phonation. No muffled voice sounds. Patient swallows secretions without difficulty. Dentition normal. No lesions of tongue or buccal mucosa. Uvula midline. No asymmetric swelling of the posterior  pharynx. + Erythema of posterior pharynx.  Petechiae and hypopigmented patches of the palate and tonsils.  No tonsillar exuduate. No lingual swelling. No induration inferior to tongue. No submandibular tenderness, swelling, or induration.  Tissues of the neck supple. + cervical lymphadenopathy with single right-sided posterior cervical lymphadenopathy.  Eyes: Conjunctivae and EOM are normal. Pupils are equal, round, and reactive to light.  Neck: Normal range of motion. Neck supple.  Cardiovascular: Normal rate, regular rhythm, S1 normal, S2 normal, intact distal pulses and normal pulses.  No murmur heard. No lower extremity edema.  Pulmonary/Chest: Effort normal and breath sounds normal. She has no wheezes. She has no rales.  Abdominal: Soft. She exhibits no distension. There is no tenderness.  There is no guarding.  Musculoskeletal: Normal range of motion. She exhibits no edema or deformity.  Discomfort to palpation bilateral paraspinal musculature of the lower thoracic and lumbar region.  No midline tenderness.  Lymphadenopathy:    She has no cervical adenopathy.  Neurological: She is alert.  Cranial nerves grossly intact. Patient was extremity symmetrically with good coordination.  Skin: Skin is warm and dry. No rash noted. No erythema.  Breast exam performed with nurse chaperone present.  Patient exhibits fibrocystic breast changes.  No mass of bilateral breast.  No axillary lymphadenopathy.  Psychiatric: She has a normal mood and affect. Her behavior is normal. Judgment and thought content normal.  Nursing note and vitals reviewed.    ED Treatments / Results  Labs (all labs ordered are listed, but only abnormal results are displayed) Labs Reviewed  CBC - Abnormal; Notable for the following components:      Result Value   WBC 11.9 (*)    All other components within normal limits  RAPID STREP SCREEN (NOT AT [facility])  CULTURE, GROUP A STREP [facility])  BASIC METABOLIC PANEL  MONONUCLEOSIS  SCREEN  HIV ANTIBODY (ROUTINE TESTING)  I-STAT TROPONIN, ED  I-STAT BETA HCG BLOOD, ED ([facility], [facility], [facility] ONLY)  GC/CHLAMYDIA PROBE AMP ([place]) NOT AT [facility]    EKG  EKG Interpretation None       Radiology Dg Chest 2 View  Result Date: 01/28/2017 CLINICAL DATA:  Bilateral flank pain and chest pain for 3 months. EXAM: CHEST  2 VIEW COMPARISON:  April 21, 2016 FINDINGS: The heart size and mediastinal contours are within normal limits. Both lungs are clear. The visualized skeletal structures are unremarkable. IMPRESSION: No active cardiopulmonary disease. Electronically Signed   By: Gerome Sam III M.D   On: 01/28/2017 14:20    Procedures Procedures (including critical care time)  Medications Ordered in ED Medications  ketorolac (TORADOL) 30 MG/ML injection 30 mg (30 mg Intramuscular Given 01/28/17 1603)     Initial Impression / Assessment and Plan / ED Course  I have reviewed the triage vital signs and the nursing notes.  Pertinent labs & imaging results that were available during my care of the patient were reviewed by me and considered in my medical decision making (see chart for details).     Final Clinical Impressions(s) / ED Diagnoses   Final diagnoses:  Pharyngitis, unspecified etiology  Enlarged lymph node  Chest pain, central  Breast pain   Patient is nontoxic appearing, afebrile, in no acute distress.  Differential diagnosis includes mononucleosis, strep pharyngitis, viral pharyngitis, viral upper respiratory infection, gonococcal pharyngitis.  Will proceed with GC/CT swab of the throat, strep swab, Monospot test.  Patient does have a leukocytosis of 11.9.  I suspect this is a viral infection.  Patient will likely be able to follow-up outpatient with ENT for her posterior cervical singular adenopathy that is unchanged times 1 year, as well as find a community physician to follow for complaints.  Rapid strep negative. Suspect viral etiology.  Monospot negative.   Patient has no hepatosplenomegaly. Presentation not concerning for PTA or infection spread to soft tissue. No trismus or uvula deviation. Patient with normal phonation. Exam demonstrates soft neck tissue, no swelling or induration inferior to the tongue or in the submandibular space  Treated in the ED with toradol. Rx for naproxen.Specific return precautions discussed for change in voice, inability to tolerate secretions, difficulty breathing or swallowing, or increased nausea or vomiting. Discussed importance of hydration.  Additionally,  patient had improved muscular skeletal pain with Toradol.  Tenderness not concerning for intra-abdominal pathology.  Chest pain appears to be chest wall and breast in etiology.  All workup with troponin, EKG, and x-ray are negative.  I discussed with the patient following up with ear nose and throat for the stable enlarged lymph node.  I provided reassurance, as this is not changed in 1 year.  I recommended PCP establishment at [facility]. All questions answered.    ED Discharge Orders        Ordered    naproxen (NAPROSYN) 500 MG tablet  2 times daily     01/28/17 1808       Delia Chimes 01/28/17 1815    Eber Hong, MD 01/29/17 [phone]

## 2017-01-28 NOTE — ED Provider Notes (Signed)
The patient is a 31 year old female, she has a known history of sore throat for the last 3 days, she is also concerned about a lymph node which has been persistently swollen on the back right side of her neck at the base of her head and scalp.  She states that the neck pain or which is more poignantly termed her sore throat has gotten worse today.  There is no fevers, no abdominal pain, on exam she does have mild erythema of the posterior pharynx, possible isolated pustular area on the right tonsil, no uvular deviation, no peritonsillar swelling, normal phonation, no trismus, no torticollis, moist mucous membranes.  No hepatospleno megaly, clear heart and lung sounds, there are some lymph nodes palpable on the left posterior axillary chain in the occipital nodes however they are normal sized and nontender.  She has no other lymphadenopathy.  Evaluation will include causes for the sore throat, she appears well and can follow-up in the outpatient setting.  Medical screening examination/treatment/procedure(s) were conducted as a shared visit with non-physician practitioner(s) and myself.  I personally evaluated the patient during the encounter.  Clinical Impression:   Final diagnoses:  Pharyngitis, unspecified etiology  Enlarged lymph node  Chest pain, central  Breast pain         Eber HongMiller, Meline Russaw, MD 01/29/17 [phone]

## 2017-01-28 NOTE — Discharge Instructions (Signed)
Please see the information and instructions below regarding your visit.  Your diagnoses today include:  1. Pharyngitis, unspecified etiology   2. Enlarged lymph node   3. Chest pain, central   4. Breast pain    Your exam and testing today is reassuring.  We ordered a couple of tests that will take a couple days to come back, including HIV and gonorrhea swab of the throat.  If either of these are positive you will receive a call early next week.  He will get instructions on how to follow-up.  I would like you to find a primary care provider in the community.  I recommend following up with [facility] which I listed information for.  You may also follow-up with the health department for your breast pain.  It is also reassuring that your lymph node has not changed in 1 year.  Please follow-up with Dr. Suszanne Connerseoh in ENT which I provided the number for on this paperwork regarding this lymph node.  This can be followed up outpatient.  Tests performed today include: See side panel of your discharge paperwork for testing performed today. Vital signs are listed at the bottom of these instructions.   Medications prescribed:    Take any prescribed medications only as prescribed, and any over the counter medications only as directed on the packaging.  Imaging naproxen for your chest wall muscular skeletal pain.  This may also help with the breast pain.  I also recommend obtaining over-the-counter evening primrose oil for your breast pain.    Home care instructions:  Please follow any educational materials contained in this packet.   Please cut down on your caffeine to the you are only drinking 1-2 caffeinated drinks per day to see if this will help with your breast pain.  You may also try warm compresses for the discomfort.  Follow-up instructions: Please follow-up with your primary care provider as soon as possible for establishment of care.   Please follow up with Dr. Suszanne Connerseoh in ENT  in for evaluation of the lymph node on the back of your neck.    Return instructions:  Please return to the Emergency Department if you experience worsening symptoms.  Please return the emergency department for any fever, chills, worsening shortness of breath, worsening chest pain, cough productive of blood, or passing out spells. Please return if you have any other emergent concerns.  Additional Information:  Your vital signs today were: BP 101/84    Pulse 73    Temp 98.7 F (37.1 C) (Oral)    Resp 16    Ht 5\' 6"  (1.676 m)    Wt 68 kg (150 lb)    LMP 01/28/2017    SpO2 99%    BMI 24.21 kg/m  If your blood pressure (BP) was elevated on multiple readings during this visit above 130 for the top number or above 80 for the bottom number, please have this repeated by your primary care provider within one month. --------------  Thank you for allowing us to participate in your care today.

## 2017-01-29 LAB — CULTURE, GROUP A STREP (THRC)

## 2017-01-29 LAB — HIV ANTIBODY (ROUTINE TESTING W REFLEX): HIV Screen 4th Generation wRfx: NONREACTIVE

## 2017-01-30 LAB — GC/CHLAMYDIA PROBE AMP (~~LOC~~) NOT AT ARMC
Chlamydia: NEGATIVE
Neisseria Gonorrhea: NEGATIVE

## 2017-10-28 ENCOUNTER — Ambulatory Visit (HOSPITAL_COMMUNITY)
Admission: EM | Admit: 2017-10-28 | Discharge: 2017-10-28 | Disposition: A | Discharge status: Home / Self Care | Payer: Medicaid Other | Attending: Internal Medicine | Admitting: Internal Medicine

## 2017-10-28 ENCOUNTER — Other Ambulatory Visit: Payer: Self-pay

## 2017-10-28 ENCOUNTER — Encounter (HOSPITAL_COMMUNITY): Payer: Self-pay

## 2017-10-28 DIAGNOSIS — S0501XA Injury of conjunctiva and corneal abrasion without foreign body, right eye, initial encounter: Secondary | ICD-10-CM | POA: Diagnosis not present

## 2017-10-28 MED ORDER — FLUORESCEIN SODIUM 1 MG OP STRP
ORAL_STRIP | OPHTHALMIC | Status: AC
  Filled 2017-10-28: qty 1

## 2017-10-28 MED ORDER — ERYTHROMYCIN 5 MG/GM OP OINT: TOPICAL_OINTMENT | OPHTHALMIC | 0 refills | Status: DC

## 2017-10-28 MED ORDER — TETRACAINE HCL 0.5 % OP SOLN
OPHTHALMIC | Status: AC
  Filled 2017-10-28: qty 4
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
## 2017-10-28 NOTE — Discharge Instructions (Signed)
Please apply erythromycin ointment into the lower lid 3 times a day  Please [facility] 9 [facility] on Monday  [facility] if symptoms worsening
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
## 2017-11-16 ENCOUNTER — Encounter (HOSPITAL_COMMUNITY): Payer: Self-pay | Admitting: *Deleted

## 2017-11-16 ENCOUNTER — Emergency Department (HOSPITAL_COMMUNITY): Payer: Medicaid Other

## 2017-11-16 ENCOUNTER — Emergency Department (HOSPITAL_COMMUNITY)
Admission: EM | Admit: 2017-11-16 | Discharge: 2017-11-16 | Disposition: A | Discharge status: Home / Self Care | Payer: Medicaid Other | Attending: Emergency Medicine | Admitting: Emergency Medicine

## 2017-11-16 DIAGNOSIS — M542 Cervicalgia: Secondary | ICD-10-CM | POA: Insufficient documentation

## 2017-11-16 DIAGNOSIS — R51 Headache: Secondary | ICD-10-CM | POA: Insufficient documentation

## 2017-11-16 DIAGNOSIS — R079 Chest pain, unspecified: Secondary | ICD-10-CM | POA: Insufficient documentation

## 2017-11-16 DIAGNOSIS — N644 Mastodynia: Secondary | ICD-10-CM | POA: Diagnosis not present

## 2017-11-16 DIAGNOSIS — F17210 Nicotine dependence, cigarettes, uncomplicated: Secondary | ICD-10-CM | POA: Insufficient documentation

## 2017-11-16 LAB — I-STAT BETA HCG BLOOD, ED (MC, WL, AP ONLY)

## 2017-11-16 MED ORDER — NAPROXEN 500 MG PO TABS: 500.0000 mg | ORAL_TABLET | Freq: Two times a day (BID) | ORAL | 0 refills | Status: DC

## 2017-11-16 MED ORDER — CYCLOBENZAPRINE HCL 10 MG PO TABS: 10.0000 mg | ORAL_TABLET | Freq: Two times a day (BID) | ORAL | 0 refills | Status: DC | PRN

## 2017-11-16 MED ORDER — OXYCODONE HCL 5 MG PO TABS
5.0000 mg | ORAL_TABLET | Freq: Once | ORAL | Status: AC
  Administered 2017-11-16: 5 mg via ORAL
  Filled 2017-11-16: qty 1

## 2017-11-16 NOTE — ED Provider Notes (Signed)
[facility] Emergency Department Provider Note MRN:  [id number]  Arrival date & time: 11/16/17     Chief Complaint   Breast Pain   History of Present Illness   Kristie Sandoval is a 32 y.o. year-old female with a history of GERD presenting to the ED with chief complaint of breast pain.  Pain located in bilateral breasts, right greater than left for nearly a year, progressively worsening.  Patient explains that the breasts have nearly doubled in size over the course of the past 6 months, remembers an episode of galactorrhea a few months ago, which is very abnormal for her given having not been pregnant for multiple years.  Patient also endorsing a swollen lymph node on the right side of her neck, and area of pain and tenderness on the back of her head for over a year, as well as multiple months of central chest pain worse with deep breathing.  Denies fevers, endorsing blurred vision, no shortness of breath, no abdominal pain.  Review of Systems  A complete 10 system review of systems was obtained and all systems are negative except as noted in the HPI and PMH.   Patient's Health History    Past Medical History:  Diagnosis Date  . Chlamydia infection, current pregnancy 12/12   Test of cure negative in April  . GERD (gastroesophageal reflux disease)   . Gonorrhea, current pregnancy 12/12   Test of cure negative in April  . Headache(784.0)    no hx prior to preg  . Trichomonas     Past Surgical History:  Procedure Laterality Date  . NO PAST SURGERIES      Family History  Problem Relation Age of Onset  . Diabetes Maternal Grandmother   . Anesthesia problems Neg Hx   . Hypotension Neg Hx   . Malignant hyperthermia Neg Hx   . Pseudochol deficiency Neg Hx   . Other Neg Hx     Social History   Socioeconomic History  . Marital status: Single    Spouse name: Not on file  . Number of children: Not on file  . Years of education: Not on file  . Highest education  level: Not on file  Occupational History  . Not on file  Social Needs  . Financial resource strain: Not on file  . Food insecurity:    Worry: Not on file    Inability: Not on file  . Transportation needs:    Medical: Not on file    Non-medical: Not on file  Tobacco Use  . Smoking status: Current Some Day Smoker    Packs/day: 0.00    Types: Cigarettes    Last attempt to quit: 02/18/2011    Years since quitting: 6.7  . Smokeless tobacco: Current User  Substance and Sexual Activity  . Alcohol use: No  . Drug use: No    Types: Marijuana    Comment: no longer  . Sexual activity: Yes    Birth control/protection: IUD  Lifestyle  . Physical activity:    Days per week: Not on file    Minutes per session: Not on file  . Stress: Not on file  Relationships  . Social connections:    Talks on phone: Not on file    Gets together: Not on file    Attends religious service: Not on file    Active member of club or organization: Not on file    Attends meetings of clubs or organizations: Not on file  Relationship status: Not on file  . Intimate partner violence:    Fear of current or ex partner: Not on file    Emotionally abused: Not on file    Physically abused: Not on file    Forced sexual activity: Not on file  Other Topics Concern  . Not on file  Social History Narrative  . Not on file     Physical Exam  Vital Signs and Nursing Notes reviewed Vitals:   11/16/17 1330 11/16/17 1400  BP: 95/64 98/83  Pulse:  69  Resp: 12 15  Temp:    SpO2:  (!) 78%    CONSTITUTIONAL: Well-appearing, NAD NEURO:  Alert and oriented x 3, no focal deficits EYES:  eyes equal and reactive ENT/NECK: 1 cm lymph node of the right lateral neck, tender to palpation, no JVD CARDIO: Regular rate, well-perfused, normal S1 and S2 PULM:  CTAB no wheezing or rhonchi GI/GU:  normal bowel sounds, non-distended, non-tender MSK/SPINE:  No gross deformities, no edema SKIN:  no rash, atraumatic, largely  unremarkable breast exam PSYCH:  Appropriate speech and behavior  Diagnostic and Interventional Summary    EKG Interpretation  Date/Time:  Thursday November 16 2017 13:18:12 EDT Ventricular Rate:  62 PR Interval:    QRS Duration: 86 QT Interval:  403 QTC Calculation: 410 R Axis:   70 Text Interpretation:  Sinus arrhythmia Confirmed by Gerlene Fee [phone]) on 11/16/2017 1:24:12 PM      Labs Reviewed  I-STAT BETA HCG BLOOD, ED ([facility], [facility], [facility] ONLY)    CT HEAD WO CONTRAST  Final Result    DG Chest 2 View  Final Result      Medications  oxyCODONE (Oxy IR/ROXICODONE) immediate release tablet 5 mg (5 mg Oral Given 11/16/17 1314)     Procedures Critical Care  ED Course and Medical Decision Making  I have reviewed the triage vital signs and the nursing notes.  Pertinent labs & imaging results that were available during my care of the patient were reviewed by me and considered in my medical decision making (see below for details). Clinical Course as of Nov 17 1451  Thu Nov 16, 2017  1242 Myriad of complaints today in this 32 year old female, most concerning of which are progressively swelling breast with a galactorrhea, blurred vision.  Unlikely but will screen for pituitary adenoma today.  Months of chest pain worse with deep breathing, favoring MSK, will screen with EKG and chest x-ray.  PERC negative.   [MB]    Clinical Course User Index [MB] Maudie Flakes, MD    CT head unremarkable, EKG and chest x-ray unremarkable, hCG negative.  No evidence of emergent condition today, a number of chronic complaints more appropriate for outpatient setting.  Referred to wellness center, advised to discuss all of her symptoms with her outpatient physician to determine need for further testing.  After the discussed management above, the patient was determined to be safe for discharge.  The patient was in agreement with this plan and all questions regarding their care were answered.  ED return  precautions were discussed and the patient will return to the ED with any significant worsening of condition.  Barth Kirks. Sedonia Small, [place] [email or web address]  Final Clinical Impressions(s) / ED Diagnoses     ICD-10-CM   1. Breast pain N64.4   2. Chest pain R07.9 DG Chest 2 View    DG Chest 2 View  3. Neck pain M54.2  ED Discharge Orders         Ordered    naproxen (NAPROSYN) 500 MG tablet  2 times daily     11/16/17 1452    cyclobenzaprine (FLEXERIL) 10 MG tablet  2 times daily PRN     11/16/17 1452             Maudie Flakes, MD 11/16/17 1454

## 2017-11-16 NOTE — ED Notes (Signed)
Patient transported to X-ray 

## 2017-11-16 NOTE — ED Notes (Signed)
Pt alert and oriented in NAD. Pt verbalized understanding of discharge instructions. 

## 2017-11-16 NOTE — ED Notes (Signed)
Pt remains in imaging.

## 2017-11-16 NOTE — Discharge Instructions (Addendum)
You were evaluated in the Emergency Department and after careful evaluation, we did not find any emergent condition requiring admission or further testing in the hospital. ° °Please return to the Emergency Department if you experience any worsening of your condition.  We encourage you to follow up with a primary care provider.  Thank you for allowing us to be a part of your care. °

## 2017-11-16 NOTE — ED Triage Notes (Signed)
Pt in c/o bilateral breast pain for the last year, also c/o lung pain when she takes a deep breath and pain in her lower back, also pain to a spot on the back of her head that is tender to touch

## 2017-11-21 ENCOUNTER — Encounter (HOSPITAL_COMMUNITY): Payer: Self-pay

## 2017-11-21 ENCOUNTER — Other Ambulatory Visit: Payer: Self-pay

## 2017-11-21 ENCOUNTER — Ambulatory Visit (HOSPITAL_COMMUNITY)
Admission: EM | Admit: 2017-11-21 | Discharge: 2017-11-21 | Disposition: A | Discharge status: Home / Self Care | Payer: Medicaid Other | Attending: Family Medicine | Admitting: Family Medicine

## 2017-11-21 DIAGNOSIS — N6310 Unspecified lump in the right breast, unspecified quadrant: Secondary | ICD-10-CM

## 2017-11-21 DIAGNOSIS — R590 Localized enlarged lymph nodes: Secondary | ICD-10-CM

## 2017-11-21 DIAGNOSIS — N644 Mastodynia: Secondary | ICD-10-CM | POA: Diagnosis not present

## 2017-11-21 DIAGNOSIS — N6314 Unspecified lump in the right breast, lower inner quadrant: Secondary | ICD-10-CM | POA: Diagnosis not present

## 2017-11-21 NOTE — ED Provider Notes (Signed)
[facility]    CSN: [id number] Arrival date & time: 11/21/17  1459     History   Chief Complaint Chief Complaint  Patient presents with  . Jaw Pain  . breast pain    HPI Kristie Sandoval is a 32 y.o. female.   HPI  Patient was just seen in the emergency room 4 days ago for breast swelling and breast pain.  An evaluation was done to look for microadenoma of the pituitary, she had blood work EKG chest x-ray and CAT scan of her head.  Everything was normal.  She is back today and is quite tearful and almost hysterical that she needs to have mammogram.  She states that she is had breast pain for a year.  It is intermittent.  Today, however, she finds as an emergency because she found out her mother has breast cancer.  She is crying that she has a 42-year-old daughter and she has to live to raise her.  I explained her that breast pain intermittently for a year was not a cancer warning sign, and that she really needed to find a primary care doctor to take care of her problems. She also has jaw pain.  She has a lump in her face, lump on her neck, some changes of her skin.  She read that lymph node enlargement can be a sign of breast cancer and is certain that the swollen lymph gland on the back of her neck is also in emergency.  This gland was noted in the emergency room.  Again, she is reminded that a family medicine doctor can take care of these problems for her. She is demanding a referral to the [facility] breast center.  I explained to her that urgent care does not do referrals.  With an abnormal breast exam, I am able to order for her mammogram only.  Past Medical History:  Diagnosis Date  . Chlamydia infection, current pregnancy 12/12   Test of cure negative in April  . GERD (gastroesophageal reflux disease)   . Gonorrhea, current pregnancy 12/12   Test of cure negative in April  . Headache(784.0)    no hx prior to preg  . Trichomonas     There are no active problems to  display for this patient.   Past Surgical History:  Procedure Laterality Date  . NO PAST SURGERIES      OB History    Gravida  2   Para  1   Term  1   Preterm  0   AB  1   Living  1     SAB  1   TAB  0   Ectopic  0   Multiple  0   Live Births  1            Home Medications    Prior to Admission medications   Medication Sig Start Date End Date Taking? Authorizing Provider  cyclobenzaprine (FLEXERIL) 10 MG tablet Take 1 tablet (10 mg total) by mouth 2 (two) times daily as needed for muscle spasms. 11/16/17   Sabas Sous, MD  levonorgestrel (MIRENA) 20 MCG/24HR IUD 1 each by Intrauterine route continuous. Started 2013.    [provider]  naproxen (NAPROSYN) 500 MG tablet Take 1 tablet (500 mg total) by mouth 2 (two) times daily. 11/16/17   Sabas Sous, MD    Family History Family History  Problem Relation Age of Onset  . Diabetes Maternal Grandmother   .  Anesthesia problems Neg Hx   . Hypotension Neg Hx   . Malignant hyperthermia Neg Hx   . Pseudochol deficiency Neg Hx   . Other Neg Hx   Mother, breast cancer  Social History Social History   Tobacco Use  . Smoking status: Current Some Day Smoker    Packs/day: 0.00    Types: Cigarettes    Last attempt to quit: 02/18/2011    Years since quitting: 6.7  . Smokeless tobacco: Current User  Substance Use Topics  . Alcohol use: No  . Drug use: No    Types: Marijuana    Comment: no longer     Allergies   Patient has no known allergies.   Review of Systems Review of Systems  Constitutional: Negative for chills and fever.  HENT: Negative for ear pain and sore throat.   Eyes: Negative for pain and visual disturbance.  Respiratory: Negative for cough and shortness of breath.   Cardiovascular: Negative for chest pain and palpitations.  Gastrointestinal: Negative for abdominal pain and vomiting.  Genitourinary: Negative for dysuria and hematuria.       Intermittent breast pain    Musculoskeletal: Negative for arthralgias and back pain.  Skin: Negative for color change and rash.  Neurological: Negative for seizures and syncope.  Hematological: Positive for adenopathy.  Psychiatric/Behavioral: The patient is nervous/anxious.   All other systems reviewed and are negative.    Physical Exam Triage Vital Signs ED Triage Vitals  Enc Vitals Group     BP 11/21/17 1552 120/72     Pulse Rate 11/21/17 1552 90     Resp 11/21/17 1552 18     Temp 11/21/17 1552 98 F (36.7 C)     Temp Source 11/21/17 1552 Oral     SpO2 --      Weight 11/21/17 1553 156 lb (70.8 kg)     Height --      Head Circumference --      Peak Flow --      Pain Score 11/21/17 1552 9     Pain Loc --      Pain Edu? --      Excl. in GC? --    No data found.  Updated Vital Signs BP 120/72 (BP Location: Right Arm)   Pulse 90   Temp 98 F (36.7 C) (Oral)   Resp 18   Wt 70.8 kg   LMP 11/09/2017   BMI 25.18 kg/m       Physical Exam  Constitutional: She appears well-developed and well-nourished. No distress.  HENT:  Head: Normocephalic and atraumatic.  Right Ear: External ear normal.  Left Ear: External ear normal.  Mouth/Throat: Oropharynx is clear and moist.  Eyes: Pupils are equal, round, and reactive to light. Conjunctivae are normal.  Neck: Normal range of motion.  Right posterior cervical node, firm, immobile, present for 1 year  Cardiovascular: Normal rate.  Pulmonary/Chest: Effort normal. No respiratory distress. Right breast exhibits mass.    Abdominal: Soft. She exhibits no distension.  Musculoskeletal: Normal range of motion. She exhibits no edema.  Lymphadenopathy:    She has cervical adenopathy.  Neurological: She is alert.  Skin: Skin is warm and dry.  Psychiatric: Her behavior is normal.  LABILE  Vitals reviewed.    UC Treatments / Results  Labs (all labs ordered are listed, but only abnormal results are displayed) Labs Reviewed - No data to  display  EKG None  Radiology No results found.  Procedures Procedures (including critical  care time)  Medications Ordered in UC Medications - No data to display  Initial Impression / Assessment and Plan / UC Course  I have reviewed the triage vital signs and the nursing notes.  Pertinent labs & imaging results that were available during my care of the patient were reviewed by me and considered in my medical decision making (see chart for details).      Final Clinical Impressions(s) / UC Diagnoses   Final diagnoses:  Breast pain in female  Mass of breast, right  Lymphadenopathy of head and neck region     Discharge Instructions     You need to reduce your caffeine.  This helps with breast pain. I have ordered a mammogram.  They will call you for an appointment You need to call the [facility] to set up an appointment.  They do take Medicaid.  They can see you promptly.  They will do an assessment and refer you for the nodule on your neck.   ED Prescriptions    None     Controlled Substance Prescriptions  Controlled Substance Registry consulted? Not Applicable   Eustace Moore, MD 11/21/17 2108

## 2017-11-21 NOTE — Discharge Instructions (Signed)
You need to reduce your caffeine.  This helps with breast pain. I have ordered a mammogram.  They will call you for an appointment You need to call the [facility] center to set up an appointment.  They do take Medicaid.  They can see you promptly.  They will do an assessment and refer you for the nodule on your neck.

## 2017-11-21 NOTE — ED Triage Notes (Signed)
Pt states she has jaw pain left side 6 mos and breast pain x 1 year.

## 2017-11-22 ENCOUNTER — Other Ambulatory Visit: Payer: Self-pay | Admitting: Family Medicine

## 2017-11-22 DIAGNOSIS — N6310 Unspecified lump in the right breast, unspecified quadrant: Secondary | ICD-10-CM

## 2017-11-28 ENCOUNTER — Ambulatory Visit
Admission: RE | Admit: 2017-11-28 | Discharge: 2017-11-28 | Disposition: A | Discharge status: Home / Self Care | Payer: Medicaid Other | Source: Ambulatory Visit | Attending: Family Medicine | Admitting: Family Medicine

## 2017-11-28 DIAGNOSIS — N6310 Unspecified lump in the right breast, unspecified quadrant: Secondary | ICD-10-CM

## 2017-11-29 NOTE — Progress Notes (Deleted)
Patient ID: Kristie Sandoval, female   DOB: 19-Mar-1986, 32 y.o.   MRN: [id number]   After being seen in the [facility] 11/21/2017.  From ED note: Patient was just seen in the emergency room 4 days ago for breast swelling and breast pain.  An evaluation was done to look for microadenoma of the pituitary, she had blood work EKG chest x-ray and CAT scan of her head.  Everything was normal.  She is back today and is quite tearful and almost hysterical that she needs to have mammogram.  She states that she is had breast pain for a year.  It is intermittent.  Today, however, she finds as an emergency because she found out her mother has breast cancer.  She is crying that she has a 2-year-old daughter and she has to live to raise her.  I explained her that breast pain intermittently for a year was not a cancer warning sign, and that she really needed to find a primary care doctor to take care of her problems. She also has jaw pain.  She has a lump in her face, lump on her neck, some changes of her skin.  She read that lymph node enlargement can be a sign of breast cancer and is certain that the swollen lymph gland on the back of her neck is also in emergency.  This gland was noted in the emergency room.  Again, she is reminded that a family medicine doctor can take care of these problems for her. She is demanding a referral to the [facility] breast center.  I explained to her that urgent care does not do referrals.  With an abnormal breast exam, I am able to order for her mammogram only.

## 2017-11-30 ENCOUNTER — Ambulatory Visit (HOSPITAL_BASED_OUTPATIENT_CLINIC_OR_DEPARTMENT_OTHER): Payer: Medicaid Other | Admitting: Licensed Clinical Social Worker

## 2017-11-30 ENCOUNTER — Inpatient Hospital Stay: Payer: Self-pay

## 2017-11-30 ENCOUNTER — Ambulatory Visit: Payer: Medicaid Other | Attending: Family Medicine | Admitting: Physician Assistant

## 2017-11-30 VITALS — BP 111/76 | HR 83 | Temp 99.4°F | Resp 18 | Ht 64.0 in | Wt 172.0 lb

## 2017-11-30 DIAGNOSIS — N644 Mastodynia: Secondary | ICD-10-CM | POA: Diagnosis not present

## 2017-11-30 DIAGNOSIS — R5383 Other fatigue: Secondary | ICD-10-CM | POA: Insufficient documentation

## 2017-11-30 DIAGNOSIS — F419 Anxiety disorder, unspecified: Secondary | ICD-10-CM | POA: Insufficient documentation

## 2017-11-30 DIAGNOSIS — R52 Pain, unspecified: Secondary | ICD-10-CM | POA: Diagnosis not present

## 2017-11-30 DIAGNOSIS — Z803 Family history of malignant neoplasm of breast: Secondary | ICD-10-CM | POA: Diagnosis not present

## 2017-11-30 DIAGNOSIS — K219 Gastro-esophageal reflux disease without esophagitis: Secondary | ICD-10-CM | POA: Diagnosis not present

## 2017-11-30 DIAGNOSIS — F17200 Nicotine dependence, unspecified, uncomplicated: Secondary | ICD-10-CM | POA: Insufficient documentation

## 2017-11-30 DIAGNOSIS — Z09 Encounter for follow-up examination after completed treatment for conditions other than malignant neoplasm: Secondary | ICD-10-CM

## 2017-11-30 DIAGNOSIS — R591 Generalized enlarged lymph nodes: Secondary | ICD-10-CM | POA: Insufficient documentation

## 2017-11-30 NOTE — Patient Instructions (Addendum)
No researching symptoms on the internet.    Generalized Anxiety Disorder, Adult Generalized anxiety disorder (GAD) is a mental health disorder. People with this condition constantly worry about everyday events. Unlike normal anxiety, worry related to GAD is not triggered by a specific event. These worries also do not fade or get better with time. GAD interferes with life functions, including relationships, work, and school. GAD can vary from mild to severe. People with severe GAD can have intense waves of anxiety with physical symptoms (panic attacks). What are the causes? The exact cause of GAD is not known. What increases the risk? This condition is more likely to develop in:  Women.  People who have a family history of anxiety disorders.  People who are very shy.  People who experience very stressful life events, such as the death of a loved one.  People who have a very stressful family environment.  What are the signs or symptoms? People with GAD often worry excessively about many things in their lives, such as their health and family. They may also be overly concerned about:  Doing well at work.  Being on time.  Natural disasters.  Friendships.  Physical symptoms of GAD include:  Fatigue.  Muscle tension or having muscle twitches.  Trembling or feeling shaky.  Being easily startled.  Feeling like your heart is pounding or racing.  Feeling out of breath or like you cannot take a deep breath.  Having trouble falling asleep or staying asleep.  Sweating.  Nausea, diarrhea, or irritable bowel syndrome (IBS).  Headaches.  Trouble concentrating or remembering facts.  Restlessness.  Irritability.  How is this diagnosed? Your health care provider can diagnose GAD based on your symptoms and medical history. You will also have a physical exam. The health care provider will ask specific questions about your symptoms, including how severe they are, when they  started, and if they come and go. Your health care provider may ask you about your use of alcohol or drugs, including prescription medicines. Your health care provider may refer you to a mental health specialist for further evaluation. Your health care provider will do a thorough examination and may perform additional tests to rule out other possible causes of your symptoms. To be diagnosed with GAD, a person must have anxiety that:  Is out of his or her control.  Affects several different aspects of his or her life, such as work and relationships.  Causes distress that makes him or her unable to take part in normal activities.  Includes at least three physical symptoms of GAD, such as restlessness, fatigue, trouble concentrating, irritability, muscle tension, or sleep problems.  Before your health care provider can confirm a diagnosis of GAD, these symptoms must be present more days than they are not, and they must last for six months or longer. How is this treated? The following therapies are usually used to treat GAD:  Medicine. Antidepressant medicine is usually prescribed for long-term daily control. Antianxiety medicines may be added in severe cases, especially when panic attacks occur.  Talk therapy (psychotherapy). Certain types of talk therapy can be helpful in treating GAD by providing support, education, and guidance. Options include: ? Cognitive behavioral therapy (CBT). People learn coping skills and techniques to ease their anxiety. They learn to identify unrealistic or negative thoughts and behaviors and to replace them with positive ones. ? Acceptance and commitment therapy (ACT). This treatment teaches people how to be mindful as a way to cope with unwanted thoughts  and feelings. ? Biofeedback. This process trains you to manage your body's response (physiological response) through breathing techniques and relaxation methods. You will work with a therapist while machines are used  to monitor your physical symptoms.  Stress management techniques. These include yoga, meditation, and exercise.  A mental health specialist can help determine which treatment is best for you. Some people see improvement with one type of therapy. However, other people require a combination of therapies. Follow these instructions at home:  Take over-the-counter and prescription medicines only as told by your health care provider.  Try to maintain a normal routine.  Try to anticipate stressful situations and allow extra time to manage them.  Practice any stress management or self-calming techniques as taught by your health care provider.  Do not punish yourself for setbacks or for not making progress.  Try to recognize your accomplishments, even if they are small.  Keep all follow-up visits as told by your health care provider. This is important. Contact a health care provider if:  Your symptoms do not get better.  Your symptoms get worse.  You have signs of depression, such as: ? A persistently sad, cranky, or irritable mood. ? Loss of enjoyment in activities that used to bring you joy. ? Change in weight or eating. ? Changes in sleeping habits. ? Avoiding friends or family members. ? Loss of energy for normal tasks. ? Feelings of guilt or worthlessness. Get help right away if:  You have serious thoughts about hurting yourself or others. If you ever feel like you may hurt yourself or others, or have thoughts about taking your own life, get help right away. You can go to your nearest emergency department or call:  Your local emergency services (911 in the [place]  A suicide crisis helpline, such as the [facility] at 484-219-0687. This is open 24 hours a day.  Summary  Generalized anxiety disorder (GAD) is a mental health disorder that involves worry that is not triggered by a specific event.  People with GAD often worry excessively about many  things in their lives, such as their health and family.  GAD may cause physical symptoms such as restlessness, trouble concentrating, sleep problems, frequent sweating, nausea, diarrhea, headaches, and trembling or muscle twitching.  A mental health specialist can help determine which treatment is best for you. Some people see improvement with one type of therapy. However, other people require a combination of therapies. This information is not intended to replace advice given to you by your health care provider. Make sure you discuss any questions you have with your health care provider. Document Released: 07/02/2012 Document Revised: 01/26/2016 Document Reviewed: 01/26/2016 Elsevier Interactive Patient Education  2018 [place] and Stress Management Stress is a normal reaction to life events. It is what you feel when life demands more than you are used to or more than you can handle. Some stress can be useful. For example, the stress reaction can help you catch the last bus of the day, study for a test, or meet a deadline at work. But stress that occurs too often or for too long can cause problems. It can affect your emotional health and interfere with relationships and normal daily activities. Too much stress can weaken your immune system and increase your risk for physical illness. If you already have a medical problem, stress can make it worse. What are the causes? All sorts of life events may cause stress. An event that causes stress  for one person may not be stressful for another person. Major life events commonly cause stress. These may be positive or negative. Examples include losing your job, moving into a new home, getting married, having a baby, or losing a loved one. Less obvious life events may also cause stress, especially if they occur day after day or in combination. Examples include working long hours, driving in traffic, caring for children, being in debt, or being in a  difficult relationship. What are the signs or symptoms? Stress may cause emotional symptoms including, the following:  Anxiety. This is feeling worried, afraid, on edge, overwhelmed, or out of control.  Anger. This is feeling irritated or impatient.  Depression. This is feeling sad, down, helpless, or guilty.  Difficulty focusing, remembering, or making decisions.  Stress may cause physical symptoms, including the following:  Aches and pains. These may affect your head, neck, back, stomach, or other areas of your body.  Tight muscles or clenched jaw.  Low energy or trouble sleeping.  Stress may cause unhealthy behaviors, including the following:  Eating to feel better (overeating) or skipping meals.  Sleeping too little, too much, or both.  Working too much or putting off tasks (procrastination).  Smoking, drinking alcohol, or using drugs to feel better.  How is this diagnosed? Stress is diagnosed through an assessment by your health care provider. Your health care provider will ask questions about your symptoms and any stressful life events.Your health care provider will also ask about your medical history and may order blood tests or other tests. Certain medical conditions and medicine can cause physical symptoms similar to stress. Mental illness can cause emotional symptoms and unhealthy behaviors similar to stress. Your health care provider may refer you to a mental health professional for further evaluation. How is this treated? Stress management is the recommended treatment for stress.The goals of stress management are reducing stressful life events and coping with stress in healthy ways. Techniques for reducing stressful life events include the following:  Stress identification. Self-monitor for stress and identify what causes stress for you. These skills may help you to avoid some stressful events.  Time management. Set your priorities, keep a calendar of events, and  learn to say "no." These tools can help you avoid making too many commitments.  Techniques for coping with stress include the following:  Rethinking the problem. Try to think realistically about stressful events rather than ignoring them or overreacting. Try to find the positives in a stressful situation rather than focusing on the negatives.  Exercise. Physical exercise can release both physical and emotional tension. The key is to find a form of exercise you enjoy and do it regularly.  Relaxation techniques. These relax the body and mind. Examples include yoga, meditation, tai chi, biofeedback, deep breathing, progressive muscle relaxation, listening to music, being out in nature, journaling, and other hobbies. Again, the key is to find one or more that you enjoy and can do regularly.  Healthy lifestyle. Eat a balanced diet, get plenty of sleep, and do not smoke. Avoid using alcohol or drugs to relax.  Strong support network. Spend time with family, friends, or other people you enjoy being around.Express your feelings and talk things over with someone you trust.  Counseling or talktherapy with a mental health professional may be helpful if you are having difficulty managing stress on your own. Medicine is typically not recommended for the treatment of stress.Talk to your health care provider if you think you need medicine for  symptoms of stress. Follow these instructions at home:  Keep all follow-up visits as directed by your health care provider.  Take all medicines as directed by your health care provider. Contact a health care provider if:  Your symptoms get worse or you start having new symptoms.  You feel overwhelmed by your problems and can no longer manage them on your own. Get help right away if:  You feel like hurting yourself or someone else. This information is not intended to replace advice given to you by your health care provider. Make sure you discuss any questions you  have with your health care provider. Document Released: 08/31/2000 Document Revised: 08/13/2015 Document Reviewed: 10/30/2012 Elsevier Interactive Patient Education  2017 [employer].   Smoking Tobacco Information Smoking tobacco will very likely harm your health. Tobacco contains a poisonous (toxic), colorless chemical called nicotine. Nicotine affects the brain and makes tobacco addictive. This change in your brain can make it hard to stop smoking. Tobacco also has other toxic chemicals that can hurt your body and raise your risk of many cancers. How can smoking tobacco affect me? Smoking tobacco can increase your chances of having serious health conditions, such as:  Cancer. Smoking is most commonly associated with lung cancer, but can lead to cancer in other parts of the body.  Chronic obstructive pulmonary disease (COPD). This is a long-term lung condition that makes it hard to breathe. It also gets worse over time.  High blood pressure (hypertension), heart disease, stroke, or heart attack.  Lung infections, such as pneumonia.  Cataracts. This is when the lenses in the eyes become clouded.  Digestive problems. This may include peptic ulcers, heartburn, and gastroesophageal reflux disease (GERD).  Oral health problems, such as gum disease and tooth loss.  Loss of taste and smell.  Smoking can affect your appearance by causing:  Wrinkles.  Yellow or stained teeth, fingers, and fingernails.  Smoking tobacco can also affect your social life.  Many workplaces, [employer], hotels, and public places are tobacco-free. This means that you may experience challenges in finding places to smoke when away from home.  The cost of a smoking habit can be expensive. Expenses for someone who smokes come in two ways: ? You spend money on a regular basis to buy tobacco. ? Your health care costs in the long-term are higher if you smoke.  Tobacco smoke can also affect the health of those  around you. Children of smokers have greater chances of: ? Sudden infant death syndrome (SIDS). ? Ear infections. ? Lung infections.  What lifestyle changes can be made?  Do not start smoking. Quit if you already do.  To quit smoking: ? Make a plan to quit smoking and commit yourself to it. Look for programs to help you and ask your health care provider for recommendations and ideas. ? Talk with your health care provider about using nicotine replacement medicines to help you quit. Medicine replacement medicines include gum, lozenges, patches, sprays, or pills. ? Do not replace cigarette smoking with electronic cigarettes, which are commonly called e-cigarettes. The safety of e-cigarettes is not known, and some may contain harmful chemicals. ? Avoid places, people, or situations that tempt you to smoke. ? If you try to quit but return to smoking, don't give up hope. It is very common for people to try a number of times before they fully succeed. When you feel ready again, give it another try.  Quitting smoking might affect the way you eat as well  as your weight. Be prepared to monitor your eating habits. Get support in planning and following a healthy diet.  Ask your health care provider about having regular tests (screenings) to check for cancer. This may include blood tests, imaging tests, and other tests.  Exercise regularly. Consider taking walks, joining a gym, or doing yoga or exercise classes.  Develop skills to manage your stress. These skills include meditation. What are the benefits of quitting smoking? By quitting smoking, you may:  Lower your risk of getting cancer and other diseases caused by smoking.  Live longer.  Breathe better.  Lower your blood pressure and heart rate.  Stop your addiction to tobacco.  Stop creating secondhand smoke that hurts other people.  Improve your sense of taste and smell.  Look better over time, due to having fewer wrinkles and less  staining.  What can happen if changes are not made? If you do not stop smoking, you may:  Get cancer and other diseases.  Develop COPD or other long-term (chronic) lung conditions.  Develop serious problems with your heart and blood vessels (cardiovascular system).  Need more tests to screen for problems caused by smoking.  Have higher, long-term healthcare costs from medicines or treatments related to smoking.  Continue to have worsening changes in your lungs, mouth, and nose.  Where to find support: To get support to quit smoking, consider:  Asking your health care provider for more information and resources.  Taking classes to learn more about quitting smoking.  Looking for local organizations that offer resources about quitting smoking.  Joining a support group for people who want to quit smoking in your local community.  Where to find more information: You may find more information about quitting smoking from:  HelpGuide.org: www.helpguide.org/articles/addictions/how-to-quit-smoking.htm  https://hall.com/: smokefree.gov  American Lung Association: www.lung.org  Contact a health care provider if:  You have problems breathing.  Your lips, nose, or fingers turn blue.  You have chest pain.  You are coughing up blood.  You feel faint or you pass out.  You have other noticeable changes that cause you to worry. Summary  Smoking tobacco can negatively affect your health, the health of those around you, your finances, and your social life.  Do not start smoking. Quit if you already do. If you need help quitting, ask your health care provider.  Think about joining a support group for people who want to quit smoking in your local community. There are many effective programs that will help you to quit this behavior. This information is not intended to replace advice given to you by your health care provider. Make sure you discuss any questions you have with your health  care provider. Document Released: 03/22/2016 Document Revised: 03/22/2016 Document Reviewed: 03/22/2016 Elsevier Interactive Patient Education  [employer].

## 2017-11-30 NOTE — Progress Notes (Signed)
Patient ID: Kristie Sandoval, female   DOB: [birth year], 32 y.o.   MRN: [id number]      Kristie ReedsShanell Sandoval, is a 32 y.o. female  [id number]  [id number]  DOB - [birth year]  Subjective:  Chief Complaint and HPI: Kristie Sandoval is a 32 y.o. female here today to establish care and for a follow up visit  Seen in ED for breast pain 11/21/2017.  She has been very concerned that she may have cancer of some kind.  MMG and U/S were normal.She is a smoker.  She is googling symptoms and admits to being hypervigilant about any ache or pain she has had.  She is a single mom and working.  Lots of worrying/obsessive thinking.   Multiple complaints-LN on back on R neck-present X several months.  fatigue X several months.  Body aches all over for months.  She has had a neg CXR and CT of her head recently.  Has supportive family.  Not SI/HI.  +anxiety  Mom with breast CA at 47.  Grandmother died in her 450s from Lung CA.   ED/Hospital notes reviewed.   Social History:  Smoker, single mom of 406 yr old Family history:  Mom-breast CA, grandmother lung CA(died in 4350s)  ROS:   Constitutional:  No f/c, No night sweats, No unexplained weight loss. EENT:  No vision changes, No blurry vision, No hearing changes. No mouth, throat, or ear problems.  Respiratory: No cough, No SOB Cardiac: No CP, no palpitations GI:  No abd pain, No N/V/D. GU: No Urinary s/sx Musculoskeletal: +body aches Neuro: No headache, no dizziness, no motor weakness.  Skin: No rash Endocrine:  No polydipsia. No polyuria.  Psych: Denies SI/HI  No problems updated.  ALLERGIES: No Known Allergies  PAST MEDICAL HISTORY: Past Medical History:  Diagnosis Date  . Chlamydia infection, current pregnancy 12/12   Test of cure negative in April  . GERD (gastroesophageal reflux disease)   . Gonorrhea, current pregnancy 12/12   Test of cure negative in April  . Headache(784.0)    no hx prior to preg  . Trichomonas     MEDICATIONS AT  HOME: Prior to Admission medications   Medication Sig Start Date End Date Taking? Authorizing Provider  cyclobenzaprine (FLEXERIL) 10 MG tablet Take 1 tablet (10 mg total) by mouth 2 (two) times daily as needed for muscle spasms. 11/16/17  Yes Sabas SousBero, Michael M, MD  levonorgestrel (MIRENA) 20 MCG/24HR IUD 1 each by Intrauterine route continuous. Started 2013.   Yes [provider]  naproxen (NAPROSYN) 500 MG tablet Take 1 tablet (500 mg total) by mouth 2 (two) times daily. 11/16/17  Yes Sabas SousBero, Michael M, MD     Objective:  EXAM:   Vitals:   11/30/17 1546  BP: 111/76  Pulse: 83  Resp: 18  Temp: 99.4 F (37.4 C)  TempSrc: Oral  SpO2: 100%  Weight: 172 lb (78 kg)  Height: 5\' 4"  (1.626 m)    General appearance : A&OX3. NAD. Non-toxic-appearing HEENT: Atraumatic and Normocephalic.  PERRLA. EOM intact.  TM clear B. Mouth-MMM, post pharynx WNL w/o erythema, No PND. Neck: supple, no JVD. No cervical lymphadenopathy. No thyromegaly.  Post cervical LN on R, <1cm, freely mobile, discreet, and non-tender.   Chest/Lungs:  Breathing-non-labored, Good air entry bilaterally, breath sounds normal without rales, rhonchi, or wheezing  CVS: S1 S2 regular, no murmurs, gallops, rubs  Abdomen: Bowel sounds present, Non tender and not distended with no gaurding, rigidity or rebound. Extremities: Bilateral Lower  Ext shows no edema, both legs are warm to touch with = pulse throughout Neurology:  CN II-XII grossly intact, Non focal.   Psych:  TP linear. J/I WNL. Normal speech. Appropriate eye contact and affect.  Skin:  No Rash  Data Review No results found for: HGBA1C   Assessment & Plan   1. Anxiety Counseled at length.  No more researching symptoms on internet!!!  Spent >62mins face to face.  Also had her meet with Jenel Lucks.  Consider SSRI, counseling if doen't improve.   - TSH - Vitamin D, 25-hydroxy  2. Smoker Smoking cessation discussed at length.    3. Body aches -  Comprehensive metabolic panel - Vitamin D, 25-hydroxy  4. Fatigue, unspecified type - Comprehensive metabolic panel - TSH  5. Lymphadenopathy Isolated and not enlarged, posterior cervical change, follow - Comprehensive metabolic panel - CBC with Differential/Platelet  6. Encounter for examination following treatment at hospital See notes  Patient have been counseled extensively about nutrition and exercise  Return in about 3 weeks (around 12/21/2017) for assign PCP; pap.  The patient was given clear instructions to go to ER or return to medical center if symptoms don't improve, worsen or new problems develop. The patient verbalized understanding. The patient was told to call to get lab results if they haven't heard anything in the next week.     Georgian Co, PA-C [facility] [place], [place] [phone]   11/30/2017, 4:20 PM

## 2017-12-01 LAB — CBC WITH DIFFERENTIAL/PLATELET
Basophils Absolute: 0.1 10*3/uL (ref 0.0–0.2)
Basos: 1 %
EOS (ABSOLUTE): 0.2 10*3/uL (ref 0.0–0.4)
Eos: 4 %
HEMATOCRIT: 37.3 % (ref 34.0–46.6)
HEMOGLOBIN: 12.7 g/dL (ref 11.1–15.9)
Immature Grans (Abs): 0 10*3/uL (ref 0.0–0.1)
Immature Granulocytes: 0 %
LYMPHS ABS: 2.1 10*3/uL (ref 0.7–3.1)
Lymphs: 38 %
MCH: 31.6 pg (ref 26.6–33.0)
MCHC: 34 g/dL (ref 31.5–35.7)
MCV: 93 fL (ref 79–97)
MONOCYTES: 9 %
MONOS ABS: 0.5 10*3/uL (ref 0.1–0.9)
NEUTROS ABS: 2.7 10*3/uL (ref 1.4–7.0)
Neutrophils: 48 %
Platelets: 302 10*3/uL (ref 150–450)
RBC: 4.02 x10E6/uL (ref 3.77–5.28)
RDW: 12.8 % (ref 12.3–15.4)
WBC: 5.7 10*3/uL (ref 3.4–10.8)

## 2017-12-01 LAB — COMPREHENSIVE METABOLIC PANEL
A/G RATIO: 1.6 (ref 1.2–2.2)
ALT: 13 IU/L (ref 0–32)
AST: 15 IU/L (ref 0–40)
Albumin: 4.1 g/dL (ref 3.5–5.5)
Alkaline Phosphatase: 61 IU/L (ref 39–117)
BUN / CREAT RATIO: 11 (ref 9–23)
BUN: 8 mg/dL (ref 6–20)
CO2: 23 mmol/L (ref 20–29)
CREATININE: 0.75 mg/dL (ref 0.57–1.00)
Calcium: 9.5 mg/dL (ref 8.7–10.2)
Chloride: 103 mmol/L (ref 96–106)
GFR calc Af Amer: 124 mL/min/{1.73_m2} (ref >59)
GFR calc non Af Amer: 107 mL/min/{1.73_m2} (ref >59)
GLOBULIN, TOTAL: 2.5 g/dL (ref 1.5–4.5)
Glucose: 84 mg/dL (ref 65–99)
POTASSIUM: 4.1 mmol/L (ref 3.5–5.2)
SODIUM: 140 mmol/L (ref 134–144)
Total Protein: 6.6 g/dL (ref 6.0–8.5)

## 2017-12-01 LAB — VITAMIN D 25 HYDROXY (VIT D DEFICIENCY, FRACTURES): Vit D, 25-Hydroxy: 22.4 ng/mL — ABNORMAL LOW (ref 30.0–100.0)

## 2017-12-01 LAB — HEMOGLOBIN A1C
Est. average glucose Bld gHb Est-mCnc: 117 mg/dL
Hgb A1c MFr Bld: 5.7 % — ABNORMAL HIGH (ref 4.8–5.6)

## 2017-12-01 LAB — TSH: TSH: 1.19 u[IU]/mL (ref 0.450–4.500)

## 2017-12-01 NOTE — BH Specialist Note (Signed)
Integrated Behavioral Health Initial Visit  MRN: [id number] Name: Kristie Sandoval  Number of Integrated Behavioral Health Clinician visits:: 1/6 Session Start time: 4:15 PM  Session End time: 4:45 PM Total time: 30 minutes  Type of Service: Integrated Behavioral Health- Individual/Family Interpretor:No. Interpretor Name and Language: N/A   Warm Hand Off Completed.       SUBJECTIVE: Kristie Sandoval is a 32 y.o. female accompanied by self Patient was referred by Trena PlattPA-C McClung for anxiety. Patient reports the following symptoms/concerns: feelings of worry, restlessness, difficulty sleeping, racing thoughts, decreased concentration, and sadness Duration of problem: 1 year; Severity of problem: moderate  OBJECTIVE: Mood: Anxious and Affect: Appropriate Risk of harm to self or others: No plan to harm self or others Pt scored positive on phq9; however, reported that she circled "thoughts of hurting self or being better off dead nearly everyday was in error. Pt shared that she does not have a hx of SI/HI/AVH  LIFE CONTEXT: Family and Social: Pt reports having a strong support system. She has a 59six year old daughter School/Work: Pt is employed and reports no financial concerns Self-Care: Pt prays to cope with stressors. She reports smoking cigarettes (1.5 ppd) Life Changes: Pt reported an increase in anxiety after mother was diagnosed with cancer a year ago. She has difficulty controlling urges to research symptoms on the Internet resulting in concerns for her physical health.   GOALS ADDRESSED: Patient will: 1. Reduce symptoms of: anxiety and depression 2. Increase knowledge and/or ability of: coping skills and healthy habits  3. Demonstrate ability to: Increase healthy adjustment to current life circumstances and Decrease self-medicating behaviors  INTERVENTIONS: Interventions utilized: Supportive Counseling, Psychoeducation and/or Health Education and Link to [facility]   Standardized Assessments completed: GAD-7 and PHQ 2&9 with C-SSRS  ASSESSMENT: Patient currently experiencing anxiety triggered by mother's diagnosis with cancer a year ago. She has difficulty controlling urges to research symptoms on the Internet resulting in concerns for her physical health. She reports feelings of worry, restlessness, difficulty sleeping, racing thoughts, decreased concentration, and sadness. Pt denies hx of SI/HI/AVH. She receives strong support from family.   Patient may benefit from psychoeducation. LCSWA educated pt on the correlation between one's physical and mental, in addition, to how trauma can negatively impact both. Therapeutic strategies were discussed. Pt is interested in decreasing cigarette use and was provided information on Quitline to provide additional support. LCSWA also encouraged pt to schedule an appointment with CPP for smoking cessation, if needed. Pt was appreciative for the assistance.   PLAN: 1. Follow up with behavioral health clinician on : Pt was encouraged to contact LCSWA if symptoms worsen or fail to improve to [facility] 2. Behavioral recommendations: LCSWA recommends that pt apply healthy coping skills discussed and utilize provided resources. Pt is encouraged to schedule follow up appointment with LCSWA 3. Referral(s): [facility] and Quitline 4. "From scale of 1-10, how likely are you to follow plan?":   Bridgett LarssonJasmine D Lewis, LCSW 12/01/17 9:04 AM

## 2017-12-02 ENCOUNTER — Other Ambulatory Visit: Payer: Self-pay | Admitting: Physician Assistant

## 2017-12-02 MED ORDER — VITAMIN D (ERGOCALCIFEROL) 1.25 MG (50000 UNIT) PO CAPS: 50000.0000 [IU] | ORAL_CAPSULE | ORAL | 0 refills | Status: DC

## 2017-12-05 ENCOUNTER — Telehealth: Payer: Self-pay | Admitting: *Deleted

## 2017-12-05 NOTE — Telephone Encounter (Signed)
-----   Message from Anders SimmondsAngela M McClung, [place]PA-[place] sent at 12/02/2017  6:51 PM EDT ----- Your blood sugar is normal/no diabetes.  Your kidney function, liver function, electrolytes are all normal.  Your thyroid is normal.  Your blood count/including lymphocytes is normal.  vitamin D is low.  This can contribute to muscle aches, anxiety, fatigue, and depression.  I have sent a prescription to the pharmacy for you to take once a week.  We will recheck this level in 3-4 months.  Follow-up as planned.  Thanks, Georgian CoAngela McClung, PA-C

## 2017-12-05 NOTE — Telephone Encounter (Signed)
Medical Assistant left message on patient's home and cell voicemail. Voicemail states to give a call back to [place] [facility] at [phone]. Patient is aware of all labs being normal except for vitamin d being low and needing to begin weekly prescription and have a repeat at the next visit.

## 2017-12-14 DIAGNOSIS — J029 Acute pharyngitis, unspecified: Secondary | ICD-10-CM | POA: Insufficient documentation

## 2017-12-14 DIAGNOSIS — R591 Generalized enlarged lymph nodes: Secondary | ICD-10-CM | POA: Insufficient documentation

## 2017-12-21 ENCOUNTER — Ambulatory Visit: Payer: Self-pay | Admitting: Family Medicine

## 2018-01-08 DIAGNOSIS — K219 Gastro-esophageal reflux disease without esophagitis: Secondary | ICD-10-CM | POA: Insufficient documentation

## 2018-01-11 ENCOUNTER — Other Ambulatory Visit: Payer: Self-pay | Admitting: Otolaryngology

## 2018-01-11 DIAGNOSIS — R591 Generalized enlarged lymph nodes: Secondary | ICD-10-CM

## 2018-01-15 ENCOUNTER — Encounter: Payer: Self-pay | Admitting: Physician Assistant

## 2018-01-18 ENCOUNTER — Ambulatory Visit
Admission: RE | Admit: 2018-01-18 | Discharge: 2018-01-18 | Disposition: A | Discharge status: Home / Self Care | Payer: Medicaid Other | Source: Ambulatory Visit | Attending: Otolaryngology | Admitting: Otolaryngology

## 2018-01-18 DIAGNOSIS — R591 Generalized enlarged lymph nodes: Secondary | ICD-10-CM

## 2018-01-18 MED ORDER — IOPAMIDOL (ISOVUE-300) INJECTION 61%
75.0000 mL | Freq: Once | INTRAVENOUS | Status: AC | PRN
  Administered 2018-01-18: 75 mL via INTRAVENOUS

## 2018-01-31 ENCOUNTER — Encounter: Payer: Self-pay | Admitting: Pulmonary Disease

## 2018-01-31 ENCOUNTER — Ambulatory Visit (INDEPENDENT_AMBULATORY_CARE_PROVIDER_SITE_OTHER): Payer: Medicaid Other | Admitting: Pulmonary Disease

## 2018-01-31 VITALS — BP 118/76 | HR 85 | Ht 65.0 in | Wt 180.6 lb

## 2018-01-31 DIAGNOSIS — R911 Solitary pulmonary nodule: Secondary | ICD-10-CM

## 2018-01-31 NOTE — Progress Notes (Signed)
Synopsis: Referred in 01/2018 for lung nodule  Subjective:   PATIENT ID: Kristie Sandoval GENDER: female DOB: [birth year], MRN: [id number]   HPI  Chief Complaint  Patient presents with  . CONSULT    Had neck  CT for 'sore throat' about 2 weeks ago and incidental findings showed nodule in right lung   Kristie Sandoval is a 32 year old female active smoker and recent evaluation for right neck lymph node who presents as a new consultation for new lung nodule of the right upper lobe found incidentally on imaging.  She has been followed by ENT for lymphadenopathy. ENT office notes including 01/23/18 have been reviewed. She is status post needle biopsy of the right posterior neck lymph node is negative for malignancy.  She has declined excisional biopsy. During imaging of her cervical lymph nodes, right upper lobe groundglass nodule noted in addition to 9mm right posterior neck.  Lung nodule is measured 6 mm with no signs of calcification.  She has never been told that she has had lung spots before.  She has hx of smoking but quit one month ago.  Denies unexplained fevers/chills, unintentional weight loss, night sweats.  Denies cough, hemoptysis, shortness of breath, chest pain.  No personal history of cancer however mother recently diagnosed with breast cancer.  Age of tobacco initiation:22y Age of tobacco cessation: 32y Average packs/day: 1 pack/day Environmental exposures: None  I have personally reviewed patient's past medical/family/social history, allergies, current medications.  Past Medical History:  Diagnosis Date  . Chlamydia infection, current pregnancy 12/12   Test of cure negative in April  . GERD (gastroesophageal reflux disease)   . Gonorrhea, current pregnancy 12/12   Test of cure negative in April  . Headache(784.0)    no hx prior to preg  . Trichomonas      Family History  Problem Relation Age of Onset  . Diabetes Maternal Grandmother   . Anesthesia problems Neg  Hx   . Hypotension Neg Hx   . Malignant hyperthermia Neg Hx   . Pseudochol deficiency Neg Hx   . Other Neg Hx      Social History   Occupational History  . Not on file  Tobacco Use  . Smoking status: Former Smoker    Packs/day: 1.00    Years: 10.00    Pack years: 10.00    Types: Cigarettes    Last attempt to quit: 10/04/2017    Years since quitting: 0.3  . Smokeless tobacco: Former NeurosurgeonUser    Quit date: 02/01/2012  Substance and Sexual Activity  . Alcohol use: No  . Drug use: No    Types: Marijuana    Comment: no longer   . Sexual activity: Yes    Birth control/protection: IUD    No Known Allergies   Outpatient Medications Prior to Visit  Medication Sig Dispense Refill  . naproxen (NAPROSYN) 500 MG tablet Take 1 tablet (500 mg total) by mouth 2 (two) times daily. (Patient taking differently: Take 500 mg by mouth 2 (two) times daily with a meal. ) 30 tablet 0  . cyclobenzaprine (FLEXERIL) 10 MG tablet Take 1 tablet (10 mg total) by mouth 2 (two) times daily as needed for muscle spasms. 20 tablet 0  . levonorgestrel (MIRENA) 20 MCG/24HR IUD 1 each by Intrauterine route continuous. Started 2013.    . Vitamin D, Ergocalciferol, (DRISDOL) 50000 units CAPS capsule Take 1 capsule (50,000 Units total) by mouth every 7 (seven) days. 16 capsule 0   No  facility-administered medications prior to visit.     ROS   Objective:  Physical Exam  Constitutional: She is oriented to person, place, and time. She appears well-developed and well-nourished. No distress.  HENT:  Head: Normocephalic and atraumatic.  Nose: Nose normal.  Mouth/Throat: No oropharyngeal exudate.  Eyes: Pupils are equal, round, and reactive to light. Conjunctivae and EOM are normal. No scleral icterus.  Neck: Normal range of motion. Neck supple. No JVD present. No tracheal deviation present.  Cardiovascular: Normal rate, regular rhythm, normal heart sounds and intact distal pulses. Exam reveals no gallop and no  friction rub.  No murmur heard. Pulmonary/Chest: Effort normal and breath sounds normal. No stridor. No respiratory distress. She has no wheezes. She has no rales.  Abdominal: Soft. Bowel sounds are normal. She exhibits no distension. There is no tenderness.  Musculoskeletal: Normal range of motion. She exhibits no edema or deformity.  Lymphadenopathy:    She has no cervical adenopathy.  Neurological: She is alert and oriented to person, place, and time. No cranial nerve deficit or sensory deficit.  Skin: Skin is warm and dry. No rash noted. She is not diaphoretic. No erythema. No pallor.  Psychiatric: She has a normal mood and affect. Her behavior is normal. Judgment and thought content normal.  Vitals reviewed.    Vitals:   01/31/18 0956  BP: 118/76  Pulse: 85  SpO2: 100%  Weight: 180 lb 9.6 oz (81.9 kg)  Height: 5\' 5"  (1.651 m)   SpO2: 100 % O2 Device: None (Room air)  Chest imaging:  CT Soft Tissue Neck W/ Contrast 01/18/18 Palpable abnormality corresponds to a lymph node in the right posterior neck measuring 3 x 9 mm. No pathologic adenopathy in the neck.  6 mm nodule right upper lobe. Given the patient's age, this is likely a benign lesion. If the patient has any other significant history of infection or malignancy, CT chest with contrast could be performed for baseline evaluation.  PFT: None on file   I have personally reviewed the above labs, images and tests noted above.    Assessment & Plan:    Discussion: 32 year old female with 10-pack-year smoking history and recent biopsy of right neck with lymph node, negative for malignancy who presents for evaluation of incidental lung nodule.  Denies fevers, chills, unintentional weight loss or night sweats.  Family history pertinent for breast cancer.  Patient reports recent mammogram that was negative (dense tissue).  Lung nodule measured 6 mm with no malignant features on imaging.  Low risk patient with low  probability ([facility] lung nodule 3.51% risk) for neoplastic etiology.  Will obtain dedicated CT chest imaging in 3 months to re-evaluate nodule and remainder of chest.  Right upper lobe lung nodule, 6mm CT Chest without contrast in 3 months to monitor nodule and fully evaluate remainder of lung fields   Orders Placed This Encounter  Procedures  . CT CHEST WO CONTRAST    SS/ Cordelia Pen [phone]/ MCD/ not diabetic/ no labs req. Dr Everardo All nurse called Per Dr Everardo All do CT Chest WO per Angelique Blonder please change to CT Chest WO  6 mm nodule right upper lobe. Given the patient's age, this is likely a benign lesion. If the patient has any other significant history of infection or malignancy, CT chest with contrast could be performed for baseline evaluation.     Standing Status:   Future    Standing Expiration Date:   04/03/2019    Order Specific Question:   Is  patient pregnant?    Answer:   No    Order Specific Question:   Preferred imaging location?    Answer:   [place] CT - [facility]    Order Specific Question:   Radiology Contrast Protocol - do NOT remove file path    Answer:   [email or web address]  No orders of the defined types were placed in this encounter.   Return in about 3 months (around 05/03/2018).  Thank you for choosing [facility] for your health needs!   Salisa Broz Mechele Collin, MD [facility] 01/31/2018 11:37 AM  Personal pager: [phone] If unanswered, please page CCM On-call: [phone]

## 2018-01-31 NOTE — Patient Instructions (Signed)
Pulmonary Nodule A pulmonary nodule is a small, round spot in your lung. It is usually found when pictures of your lungs are taken for other reasons. Most pulmonary nodules are not cancerous and do not cause symptoms. Tests will be done to make sure the nodule is not cancerous. Pulmonary nodules that are not cancerous usually do not require treatment. Follow these instructions at home:  Only take medicine as told by your doctor.  Follow up with your doctor as told. Contact a doctor if:  You have trouble breathing when doing activities.  You feel sick or more tired than normal.  You do not feel like eating.  You lose weight without trying to.  You have chills.  You have night sweats. Get help right away if:  You cannot catch your breath.  You start making whistling sounds when breathing (wheezing).  You have a cough that does not go away.  You cough up blood.  You are dizzy or feel like you are going to pass out.  You have sudden chest pain.  You have a fever or lasting symptoms for more than 2-3 days.  You have a fever and your symptoms suddenly get worse. This information is not intended to replace advice given to you by your health care provider. Make sure you discuss any questions you have with your health care provider. Document Released: 04/09/2010 Document Revised: 08/13/2015 Document Reviewed: 08/27/2012 Elsevier Interactive Patient Education  2017 Elsevier Inc.  

## 2018-02-20 ENCOUNTER — Institutional Professional Consult (permissible substitution): Payer: Self-pay | Admitting: Pulmonary Disease

## 2018-04-17 ENCOUNTER — Inpatient Hospital Stay (HOSPITAL_COMMUNITY)
Admission: AD | Admit: 2018-04-17 | Discharge: 2018-04-17 | Disposition: A | Discharge status: Home / Self Care | Payer: Medicaid Other | Attending: Obstetrics & Gynecology | Admitting: Obstetrics & Gynecology

## 2018-04-17 DIAGNOSIS — B9689 Other specified bacterial agents as the cause of diseases classified elsewhere: Secondary | ICD-10-CM

## 2018-04-17 DIAGNOSIS — Z87891 Personal history of nicotine dependence: Secondary | ICD-10-CM | POA: Diagnosis not present

## 2018-04-17 DIAGNOSIS — Z113 Encounter for screening for infections with a predominantly sexual mode of transmission: Secondary | ICD-10-CM | POA: Diagnosis present

## 2018-04-17 DIAGNOSIS — N760 Acute vaginitis: Secondary | ICD-10-CM | POA: Insufficient documentation

## 2018-04-17 LAB — URINALYSIS, ROUTINE W REFLEX MICROSCOPIC
Bacteria, UA: NONE SEEN
Bilirubin Urine: NEGATIVE
Glucose, UA: NEGATIVE mg/dL
Ketones, ur: NEGATIVE mg/dL
Leukocytes, UA: NEGATIVE
Nitrite: NEGATIVE
Protein, ur: NEGATIVE mg/dL
Specific Gravity, Urine: 1.019 (ref 1.005–1.030)
pH: 5 (ref 5.0–8.0)

## 2018-04-17 LAB — WET PREP, GENITAL
Sperm: NONE SEEN
Trich, Wet Prep: NONE SEEN
Yeast Wet Prep HPF POC: NONE SEEN

## 2018-04-17 LAB — POCT PREGNANCY, URINE: PREG TEST UR: NEGATIVE

## 2018-04-17 LAB — HIV ANTIBODY (ROUTINE TESTING W REFLEX): HIV Screen 4th Generation wRfx: NONREACTIVE

## 2018-04-17 LAB — RPR: RPR Ser Ql: NONREACTIVE

## 2018-04-17 MED ORDER — METRONIDAZOLE 500 MG PO TABS: 500.0000 mg | ORAL_TABLET | Freq: Two times a day (BID) | ORAL | 0 refills | Status: DC

## 2018-04-17 NOTE — Discharge Instructions (Signed)

## 2018-04-17 NOTE — MAU Note (Signed)
Pt states she was with a new partner. States another person told her that the person she was with had gonorrhea and chlamydia. Pt wants HIV, syphilis testing, as well as pregnancy test. Pt denies vaginal discharge, itching, burning, odor. Denies bleeding or pain. LMP: 04/08/2018

## 2018-04-17 NOTE — MAU Provider Note (Addendum)
History     CSN: [id number]  Arrival date and time: 04/17/18 0041  Chief Complaint  Patient presents with  . SEXUALLY TRANSMITTED DISEASE   Kristie Sandoval is a 33 y.o. G2P1 not currently pregnant who present to MAU for STD screening. She denies symptoms of STD, denies vaginal discharge, vaginal bleeding, odor or urinary symptoms. Reports hearing rumors that partner has GC/C and wants to make sure she does not since she had unprotected IC, reports having IC with female. She request HIV and RPR screening as well.    OB History    Gravida  2   Para  1   Term  1   Preterm  0   AB  1   Living  1     SAB  1   TAB  0   Ectopic  0   Multiple  0   Live Births  1           Past Medical History:  Diagnosis Date  . Chlamydia infection, current pregnancy 12/12   Test of cure negative in April  . GERD (gastroesophageal reflux disease)   . Gonorrhea, current pregnancy 12/12   Test of cure negative in April  . Headache(784.0)    no hx prior to preg  . Trichomonas     Past Surgical History:  Procedure Laterality Date  . NO PAST SURGERIES      Family History  Problem Relation Age of Onset  . Diabetes Maternal Grandmother   . Anesthesia problems Neg Hx   . Hypotension Neg Hx   . Malignant hyperthermia Neg Hx   . Pseudochol deficiency Neg Hx   . Other Neg Hx     Social History   Tobacco Use  . Smoking status: Former Smoker    Packs/day: 1.00    Years: 10.00    Pack years: 10.00    Types: Cigarettes    Last attempt to quit: 10/04/2017    Years since quitting: 0.5  . Smokeless tobacco: Former Neurosurgeon    Quit date: 02/01/2012  Substance Use Topics  . Alcohol use: No  . Drug use: No    Types: Marijuana    Comment: no longer     Allergies: No Known Allergies  Medications Prior to Admission  Medication Sig Dispense Refill Last Dose  . naproxen (NAPROSYN) 500 MG tablet Take 1 tablet (500 mg total) by mouth 2 (two) times daily. (Patient taking  differently: Take 500 mg by mouth 2 (two) times daily with a meal. ) 30 tablet 0 Taking    Review of Systems  Constitutional: Negative.   Respiratory: Negative.   Cardiovascular: Negative.   Gastrointestinal: Negative.   Genitourinary: Negative.    Physical Exam   Blood pressure 131/82, pulse 88, temperature 98.2 F (36.8 C), temperature source Oral, resp. rate 17, height 5\' 5"  (1.651 m), weight 83.9 kg, last menstrual period 04/06/2018, SpO2 100 %.  Physical Exam  Nursing note and vitals reviewed. Constitutional: She is oriented to person, place, and time. She appears well-developed and well-nourished.  Cardiovascular: Normal rate, regular rhythm and normal heart sounds.  Respiratory: Effort normal and breath sounds normal. No respiratory distress. She has no wheezes. She has no rales.  GI: Soft. She exhibits no distension. There is no abdominal tenderness. There is no rebound.  Neurological: She is alert and oriented to person, place, and time.    MAU Course  Procedures  MDM Medical screening examination complete  GC/C, wet prep, HIV  and RPR obtained   Wet prep positive for clue cells, will treat for BV based on recent sexual behavior. STD screening pending. Offered prophylactic treatment- patient declines at this time. Pt stable at time of discharge   Assessment and Plan   1. Screening for STD (sexually transmitted disease)   2. Bacterial vaginosis    Discharge home  STD screening pending  Discussed reasons to return to MAU  Rx for Flagyl sent to pharmacy of choice   Sharyon Cable CNM 04/17/2018, 1:33 AM

## 2018-04-18 LAB — GC/CHLAMYDIA PROBE AMP (~~LOC~~) NOT AT ARMC
Chlamydia: NEGATIVE
Neisseria Gonorrhea: NEGATIVE

## 2018-05-07 ENCOUNTER — Emergency Department (HOSPITAL_COMMUNITY): Payer: Medicaid Other

## 2018-05-07 ENCOUNTER — Other Ambulatory Visit: Payer: Self-pay

## 2018-05-07 ENCOUNTER — Emergency Department (HOSPITAL_COMMUNITY)
Admission: EM | Admit: 2018-05-07 | Discharge: 2018-05-08 | Disposition: A | Discharge status: Home / Self Care | Payer: Medicaid Other | Attending: Emergency Medicine | Admitting: Emergency Medicine

## 2018-05-07 ENCOUNTER — Encounter (HOSPITAL_COMMUNITY): Payer: Self-pay

## 2018-05-07 DIAGNOSIS — Z87891 Personal history of nicotine dependence: Secondary | ICD-10-CM | POA: Insufficient documentation

## 2018-05-07 DIAGNOSIS — K5792 Diverticulitis of intestine, part unspecified, without perforation or abscess without bleeding: Secondary | ICD-10-CM | POA: Insufficient documentation

## 2018-05-07 DIAGNOSIS — R1031 Right lower quadrant pain: Secondary | ICD-10-CM | POA: Diagnosis present

## 2018-05-07 LAB — URINALYSIS, ROUTINE W REFLEX MICROSCOPIC
Bilirubin Urine: NEGATIVE
GLUCOSE, UA: NEGATIVE mg/dL
Ketones, ur: NEGATIVE mg/dL
LEUKOCYTE UA: NEGATIVE
Nitrite: NEGATIVE
Protein, ur: NEGATIVE mg/dL
Specific Gravity, Urine: 1.01 (ref 1.005–1.030)
pH: 6 (ref 5.0–8.0)

## 2018-05-07 LAB — CBC
HCT: 37.8 % (ref 36.0–46.0)
Hemoglobin: 12.3 g/dL (ref 12.0–15.0)
MCH: 31.5 pg (ref 26.0–34.0)
MCHC: 32.5 g/dL (ref 30.0–36.0)
MCV: 96.9 fL (ref 80.0–100.0)
NRBC: 0 % (ref 0.0–0.2)
Platelets: 293 10*3/uL (ref 150–400)
RBC: 3.9 MIL/uL (ref 3.87–5.11)
RDW: 12.9 % (ref 11.5–15.5)
WBC: 11.8 10*3/uL — ABNORMAL HIGH (ref 4.0–10.5)

## 2018-05-07 LAB — LIPASE, BLOOD: Lipase: 29 U/L (ref 11–51)

## 2018-05-07 LAB — COMPREHENSIVE METABOLIC PANEL
ALBUMIN: 3.8 g/dL (ref 3.5–5.0)
ALT: 16 U/L (ref 0–44)
AST: 15 U/L (ref 15–41)
Alkaline Phosphatase: 62 U/L (ref 38–126)
Anion gap: 7 (ref 5–15)
BUN: 10 mg/dL (ref 6–20)
CO2: 26 mmol/L (ref 22–32)
Calcium: 9 mg/dL (ref 8.9–10.3)
Chloride: 104 mmol/L (ref 98–111)
Creatinine, Ser: 0.76 mg/dL (ref 0.44–1.00)
GFR calc Af Amer: >60 mL/min (ref >60)
GFR calc non Af Amer: >60 mL/min (ref >60)
Glucose, Bld: 98 mg/dL (ref 70–99)
Potassium: 3.7 mmol/L (ref 3.5–5.1)
Sodium: 137 mmol/L (ref 135–145)
Total Bilirubin: 0.3 mg/dL (ref 0.3–1.2)
Total Protein: 7.5 g/dL (ref 6.5–8.1)

## 2018-05-07 LAB — I-STAT BETA HCG BLOOD, ED (MC, WL, AP ONLY): I-stat hCG, quantitative: <5 m[IU]/mL (ref <5)

## 2018-05-07 MED ORDER — CIPROFLOXACIN HCL 500 MG PO TABS
500.0000 mg | ORAL_TABLET | Freq: Once | ORAL | Status: AC
  Administered 2018-05-08: 500 mg via ORAL
  Filled 2018-05-07: qty 1

## 2018-05-07 MED ORDER — METRONIDAZOLE 500 MG PO TABS
500.0000 mg | ORAL_TABLET | Freq: Once | ORAL | Status: AC
  Administered 2018-05-08: 500 mg via ORAL
  Filled 2018-05-07: qty 1

## 2018-05-07 MED ORDER — MORPHINE SULFATE (PF) 4 MG/ML IV SOLN
4.0000 mg | Freq: Once | INTRAVENOUS | Status: AC
  Administered 2018-05-07: 4 mg via INTRAVENOUS
  Filled 2018-05-07: qty 1

## 2018-05-07 MED ORDER — IOPAMIDOL (ISOVUE-300) INJECTION 61%
100.0000 mL | Freq: Once | INTRAVENOUS | Status: AC | PRN
  Administered 2018-05-07: 100 mL via INTRAVENOUS

## 2018-05-07 MED ORDER — SODIUM CHLORIDE 0.9% FLUSH: 3.0000 mL | Freq: Once | INTRAVENOUS | Status: DC

## 2018-05-07 MED ORDER — IOPAMIDOL (ISOVUE-300) INJECTION 61%
INTRAVENOUS | Status: AC
  Filled 2018-05-07: qty 100

## 2018-05-07 NOTE — ED Notes (Addendum)
Patient endorses multiple complaints. States 10/10 pain in RLQ, with her last normal BM being 3 days ago. She said she had a small, abnormal BM today. She stated she wants a Ct of her abdomen, as well as a CT of her lungs because she [place] scheduled to get a CT of a nodule in her lung on the 24th. She also is requesting an MRI of her head for a HA and a "bump" on her L posterior side. Patient also complaining of stomach/ mid upper quadrant pain, back pain and head pain.

## 2018-05-07 NOTE — ED Triage Notes (Signed)
Pt states she is having RLQ pain for 3 days. Pt states that she has had some constipation as well.

## 2018-05-07 NOTE — ED Notes (Signed)
Patient transported to CT via stretcher.

## 2018-05-07 NOTE — ED Notes (Signed)
Patient transported back from CT 

## 2018-05-08 ENCOUNTER — Telehealth: Payer: Self-pay | Admitting: Pulmonary Disease

## 2018-05-08 MED ORDER — CIPROFLOXACIN HCL 500 MG PO TABS: 500.0000 mg | ORAL_TABLET | Freq: Two times a day (BID) | ORAL | 0 refills | Status: DC

## 2018-05-08 MED ORDER — ONDANSETRON 8 MG PO TBDP: 8.0000 mg | ORAL_TABLET | Freq: Three times a day (TID) | ORAL | 0 refills | Status: DC | PRN

## 2018-05-08 MED ORDER — HYDROCODONE-ACETAMINOPHEN 5-325 MG PO TABS
1.0000 | ORAL_TABLET | Freq: Once | ORAL | Status: AC
  Administered 2018-05-08: 1 via ORAL
  Filled 2018-05-08: qty 1

## 2018-05-08 MED ORDER — METRONIDAZOLE 500 MG PO TABS: 500.0000 mg | ORAL_TABLET | Freq: Three times a day (TID) | ORAL | 0 refills | Status: DC

## 2018-05-08 NOTE — Telephone Encounter (Signed)
Please contact patient and let her know that I am currently working in the ICU for the next week but I have personally reviewed her CT scan. I am happy to tell her that the lung nodule that was previously seen on her prior scan has resolved. Sometimes people develop lung spots that are secondary to inflammation and/or infection and these spots often resolve on their own without any need for further testing. If she would like me to call to discuss the results further, please let me know and I can contact her next week after I am out of the ICU. Her appointment on 2/24 can also be cancelled as the lung spot has resolved.

## 2018-05-08 NOTE — Telephone Encounter (Signed)
I have called the patient she advised me that she had a  Chest Ct yesterday and would like to know what those results ment by Resolution of ground-glass nodule in the right upper lobe since prior exam. She was very upset when I tried to explain that she has an appointment on 05/14/17 and Dr.Ellison would go over  This with her then as well.She thought that was an appointment  for the original ct scan that was scheduled.   She would like Dr.Ellison to call her with the results.  Dr.Ellison please advise.

## 2018-05-09 NOTE — Telephone Encounter (Signed)
[email or web address]

## 2018-05-09 NOTE — Telephone Encounter (Signed)
lmom 

## 2018-05-09 NOTE — ED Provider Notes (Signed)
[facility]-EMERGENCY DEPT Provider Note   CSN: [id number] Arrival date & time: 05/07/18  1358    History   Chief Complaint Chief Complaint  Patient presents with  . Abdominal Pain    HPI Kristie Sandoval is a 33 y.o. female.     HPI Patient is a 33 year old female presents the emergency department with upper abdominal pain over the past 3 days.  She denies diarrhea.  She denies dysuria and urinary frequency.  No abnormal menstrual cycles.  No blood noted in her stool.  Nausea without vomiting.  Patient reports never having pain like this before.  No other complaints in relation to her abdominal pain today.  She is requesting that if a CT of her abdomen and pelvis is going to be performed she is requesting a CT of her chest be performed as well as she has a known pulmonary nodule on the schedule for CT of her chest in several days for follow-up purposes.  She has no respiratory symptoms.  She makes comment regarding a scalp bump which is been present over a year.  She has had a CT scan through this area before in the past.  She is requesting an MRI.  She reports that the small bump within her posterior left scalp hairline has not changed it just has not resolved.   Past Medical History:  Diagnosis Date  . Chlamydia infection, current pregnancy 12/12   Test of cure negative in April  . GERD (gastroesophageal reflux disease)   . Gonorrhea, current pregnancy 12/12   Test of cure negative in April  . Headache(784.0)    no hx prior to preg  . Trichomonas     There are no active problems to display for this patient.   Past Surgical History:  Procedure Laterality Date  . NO PAST SURGERIES       OB History    Gravida  2   Para  1   Term  1   Preterm  0   AB  1   Living  1     SAB  1   TAB  0   Ectopic  0   Multiple  0   Live Births  1            Home Medications    Prior to Admission medications   Medication Sig Start Date  End Date Taking? Authorizing Provider  acetaminophen (TYLENOL) 500 MG tablet Take 1,000 mg by mouth every 6 (six) hours as needed for mild pain.   Yes [provider]  naproxen sodium (ALEVE) 220 MG tablet Take 440 mg by mouth 2 (two) times daily as needed (pain).   Yes [provider]  raNITIdine HCl (ACID REDUCER PO) Take 2 tablets by mouth daily as needed (acid reflux).   Yes [provider]  ciprofloxacin (CIPRO) 500 MG tablet Take 1 tablet (500 mg total) by mouth 2 (two) times daily. 05/08/18   Azalia Bilis, MD  metroNIDAZOLE (FLAGYL) 500 MG tablet Take 1 tablet (500 mg total) by mouth 3 (three) times daily. 05/08/18   Azalia Bilis, MD  naproxen (NAPROSYN) 500 MG tablet Take 1 tablet (500 mg total) by mouth 2 (two) times daily. Patient not taking: Reported on 05/07/2018 11/16/17   Sabas Sous, MD  ondansetron (ZOFRAN ODT) 8 MG disintegrating tablet Take 1 tablet (8 mg total) by mouth every 8 (eight) hours as needed for nausea or vomiting. 05/08/18   Azalia Bilis, MD  Family History Family History  Problem Relation Age of Onset  . Diabetes Maternal Grandmother   . Anesthesia problems Neg Hx   . Hypotension Neg Hx   . Malignant hyperthermia Neg Hx   . Pseudochol deficiency Neg Hx   . Other Neg Hx     Social History Social History   Tobacco Use  . Smoking status: Former Smoker    Packs/day: 1.00    Years: 10.00    Pack years: 10.00    Types: Cigarettes    Last attempt to quit: 10/04/2017    Years since quitting: 0.5  . Smokeless tobacco: Former NeurosurgeonUser    Quit date: 02/01/2012  Substance Use Topics  . Alcohol use: No  . Drug use: No    Types: Marijuana    Comment: no longer      Allergies   Patient has no known allergies.   Review of Systems Review of Systems  All other systems reviewed and are negative.    Physical Exam Updated Vital Signs BP 100/67   Pulse 84   Temp 98.9 F (37.2 C) (Oral)   Resp 15   Ht 5\' 5"  (1.651 m)   Wt  81.5 kg   LMP 05/01/2018   SpO2 98%   BMI 29.89 kg/m   Physical Exam Vitals signs and nursing note reviewed.  Constitutional:      General: She is not in acute distress.    Appearance: She is well-developed.  HENT:     Head: Normocephalic and atraumatic.     Comments: No palpable lesions found in her posterior scalp.  No hair loss.  No skin changes. Neck:     Musculoskeletal: Normal range of motion.  Cardiovascular:     Rate and Rhythm: Normal rate and regular rhythm.     Heart sounds: Normal heart sounds.  Pulmonary:     Effort: Pulmonary effort is normal.     Breath sounds: Normal breath sounds.  Abdominal:     General: There is no distension.     Palpations: Abdomen is soft.     Tenderness: There is abdominal tenderness in the epigastric area.  Musculoskeletal: Normal range of motion.  Skin:    General: Skin is warm and dry.  Neurological:     Mental Status: She is alert and oriented to person, place, and time.  Psychiatric:        Judgment: Judgment normal.      ED Treatments / Results  Labs (all labs ordered are listed, but only abnormal results are displayed) Labs Reviewed  CBC - Abnormal; Notable for the following components:      Result Value   WBC 11.8 (*)    All other components within normal limits  URINALYSIS, ROUTINE W REFLEX MICROSCOPIC - Abnormal; Notable for the following components:   Hgb urine dipstick MODERATE (*)    Bacteria, UA RARE (*)    All other components within normal limits  LIPASE, BLOOD  COMPREHENSIVE METABOLIC PANEL  I-STAT BETA HCG BLOOD, ED ([facility], [facility], [facility] ONLY)    EKG None  Radiology Ct Chest W Contrast  Result Date: 05/07/2018 CLINICAL DATA:  Right lower quadrant pain since 10 a.m. EXAM: CT CHEST, ABDOMEN, AND PELVIS WITH CONTRAST TECHNIQUE: Multidetector CT imaging of the chest, abdomen and pelvis was performed following the standard protocol during bolus administration of intravenous contrast. CONTRAST:  100mL ISOVUE-300  IOPAMIDOL (ISOVUE-300) INJECTION 61% COMPARISON:  CT AP 04/09/2016, CXR 11/16/2017, neck CT 01/18/2018 which demonstrated a faint ground-glass  nodule in the right upper lobe. FINDINGS: CT CHEST FINDINGS Cardiovascular: Normal heart size. Conventional branch pattern of the great vessels. Nonaneurysmal thoracic aorta. No dissection. No large central pulmonary embolus. Mediastinum/Nodes: No enlarged mediastinal, hilar, or axillary lymph nodes. Thyroid gland, trachea, and esophagus demonstrate no significant findings. Lungs/Pleura: Resolution of ground-glass nodule in the right upper lobe. No new dominant mass, pulmonary consolidation, effusion or pneumothorax. Bibasilar passive atelectasis. Musculoskeletal: No chest wall mass or suspicious bone lesions identified. CT ABDOMEN PELVIS FINDINGS Hepatobiliary: No focal liver abnormality is seen. No gallstones, gallbladder wall thickening, or biliary dilatation. Pancreas: Unremarkable. No pancreatic ductal dilatation or surrounding inflammatory changes. Spleen: Normal in size without focal abnormality. Adrenals/Urinary Tract: Adrenal glands are unremarkable. Kidneys are normal, without renal calculi, focal lesion, or hydronephrosis. Bladder is unremarkable. Stomach/Bowel: Normal appendix. Moderate pericecal transmural thickening and mild inflammatory change possibly from right-sided acute diverticulitis or colitis. In the appropriate clinical setting, typhlitis may also account for this appearance. No bowel obstruction. Normal-appearing stomach. Mild left-sided colonic diverticulosis. Vascular/Lymphatic: No significant vascular findings are present. No enlarged abdominal or pelvic lymph nodes. Reproductive: Uterus and bilateral adnexa are unremarkable. Other: No free air. Trace physiologic pelvic free fluid. Musculoskeletal: No acute or significant osseous findings. IMPRESSION: 1. Moderate pericecal transmural thickening and mild inflammatory change possibly from right-sided  acute diverticulitis or colitis. In the appropriate clinical setting, typhlitis may also account for this appearance. 2. Resolution of ground-glass nodule in the right upper lobe since prior exam. 3. Mild left-sided colonic diverticulosis. Electronically Signed   By: Tollie Eth M.D.   On: 05/07/2018 22:59   Ct Abdomen Pelvis W Contrast  Result Date: 05/07/2018 CLINICAL DATA:  Right lower quadrant pain since 10 a.m. EXAM: CT CHEST, ABDOMEN, AND PELVIS WITH CONTRAST TECHNIQUE: Multidetector CT imaging of the chest, abdomen and pelvis was performed following the standard protocol during bolus administration of intravenous contrast. CONTRAST:  ISOVUE-300 IOPAMIDOL (ISOVUE-300) INJECTION 61% COMPARISON:  CT AP 04/09/2016, CXR 11/16/2017, neck CT 01/18/2018 which demonstrated a faint ground-glass nodule in the right upper lobe. FINDINGS: CT CHEST FINDINGS Cardiovascular: Normal heart size. Conventional branch pattern of the great vessels. Nonaneurysmal thoracic aorta. No dissection. No large central pulmonary embolus. Mediastinum/Nodes: No enlarged mediastinal, hilar, or axillary lymph nodes. Thyroid gland, trachea, and esophagus demonstrate no significant findings. Lungs/Pleura: Resolution of ground-glass nodule in the right upper lobe. No new dominant mass, pulmonary consolidation, effusion or pneumothorax. Bibasilar passive atelectasis. Musculoskeletal: No chest wall mass or suspicious bone lesions identified. CT ABDOMEN PELVIS FINDINGS Hepatobiliary: No focal liver abnormality is seen. No gallstones, gallbladder wall thickening, or biliary dilatation. Pancreas: Unremarkable. No pancreatic ductal dilatation or surrounding inflammatory changes. Spleen: Normal in size without focal abnormality. Adrenals/Urinary Tract: Adrenal glands are unremarkable. Kidneys are normal, without renal calculi, focal lesion, or hydronephrosis. Bladder is unremarkable. Stomach/Bowel: Normal appendix. Moderate pericecal transmural  thickening and mild inflammatory change possibly from right-sided acute diverticulitis or colitis. In the appropriate clinical setting, typhlitis may also account for this appearance. No bowel obstruction. Normal-appearing stomach. Mild left-sided colonic diverticulosis. Vascular/Lymphatic: No significant vascular findings are present. No enlarged abdominal or pelvic lymph nodes. Reproductive: Uterus and bilateral adnexa are unremarkable. Other: No free air. Trace physiologic pelvic free fluid. Musculoskeletal: No acute or significant osseous findings. IMPRESSION: 1. Moderate pericecal transmural thickening and mild inflammatory change possibly from right-sided acute diverticulitis or colitis. In the appropriate clinical setting, typhlitis may also account for this appearance. 2. Resolution of ground-glass nodule in the right upper lobe  since prior exam. 3. Mild left-sided colonic diverticulosis. Electronically Signed   By: Tollie Ethavid  Kwon M.D.   On: 05/07/2018 22:59    Procedures Procedures (including critical care time)  Medications Ordered in ED Medications  morphine 4 MG/ML injection 4 mg (4 mg Intravenous Given 05/07/18 2113)  iopamidol (ISOVUE-300) 61 % injection 100 mL (100 mLs Intravenous Contrast Given 05/07/18 2208)  ciprofloxacin (CIPRO) tablet 500 mg (500 mg Oral Given 05/08/18 0009)  metroNIDAZOLE (FLAGYL) tablet 500 mg (500 mg Oral Given 05/08/18 0009)  HYDROcodone-acetaminophen (NORCO/VICODIN) 5-325 MG per tablet 1 tablet (1 tablet Oral Given 05/08/18 0026)     Initial Impression / Assessment and Plan / ED Course  I have reviewed the triage vital signs and the nursing notes.  Pertinent labs & imaging results that were available during my care of the patient were reviewed by me and considered in my medical decision making (see chart for details).        Questionable colitis versus diverticulitis found on CT imaging of her abdomen and pelvis.  Patient be discharged home with Zofran,  Cipro, Flagyl.  She is been instructed not to drink alcohol with the Flagyl.  In regards to her CT chest it appears that her pulmonary nodule has resolved.  She will contact the pulmonary office and let them know that her CT scan was performed to determine any need for additional follow-up.  In regards to her scalp discomfort I recommended follow-up with a dermatologist.  There is no overt pathology found on physical examination today.  These symptoms within her posterior scalp and when present over the past year.  Final Clinical Impressions(s) / ED Diagnoses   Final diagnoses:  Diverticulitis    ED Discharge Orders         Ordered    ciprofloxacin (CIPRO) 500 MG tablet  2 times daily     05/08/18 0011    metroNIDAZOLE (FLAGYL) 500 MG tablet  3 times daily     05/08/18 0011    ondansetron (ZOFRAN ODT) 8 MG disintegrating tablet  Every 8 hours PRN     05/08/18 0011           Azalia Bilisampos, Joelle Flessner, MD 05/09/18 0022

## 2018-05-10 NOTE — Telephone Encounter (Signed)
Pt returning call CB# [phone]

## 2018-05-10 NOTE — Telephone Encounter (Signed)
ATC Patient.  Left message to call back when available.  

## 2018-05-10 NOTE — Telephone Encounter (Signed)
Spoke with pt, advised her of the message from Dr. Everardo All. Pt understood and would like to know if she still needed to come to the OV on 2/24. Please advise.

## 2018-05-11 ENCOUNTER — Other Ambulatory Visit: Payer: Self-pay

## 2018-05-11 NOTE — Telephone Encounter (Signed)
She can cancel the 2/24 appointment. Please refer to the original note instructions.

## 2018-05-14 ENCOUNTER — Ambulatory Visit: Payer: Self-pay | Admitting: Pulmonary Disease

## 2018-05-14 NOTE — Telephone Encounter (Signed)
Spoke with pt. She is aware of Dr. George Hugh response. OV has been cancelled.

## 2018-12-28 ENCOUNTER — Other Ambulatory Visit: Payer: Self-pay

## 2018-12-28 ENCOUNTER — Emergency Department (HOSPITAL_COMMUNITY)
Admission: EM | Admit: 2018-12-28 | Discharge: 2018-12-28 | Disposition: A | Discharge status: Home / Self Care | Payer: Medicaid Other | Attending: Emergency Medicine | Admitting: Emergency Medicine

## 2018-12-28 DIAGNOSIS — Z5321 Procedure and treatment not carried out due to patient leaving prior to being seen by health care provider: Secondary | ICD-10-CM | POA: Diagnosis not present

## 2018-12-28 DIAGNOSIS — R102 Pelvic and perineal pain: Secondary | ICD-10-CM | POA: Diagnosis not present

## 2018-12-28 DIAGNOSIS — N644 Mastodynia: Secondary | ICD-10-CM | POA: Diagnosis present

## 2018-12-28 NOTE — ED Triage Notes (Signed)
Patient states she couldn't sleep last night, woke up this morning and took a pregnancy test. C/o breast (nipple) pain and pain in pelvic region. Denies any bleeding. Patient states she "can't be that far along". States she doesn't remember LMP dates

## 2018-12-28 NOTE — ED Notes (Signed)
Patient stated she is here for ultrasound because she has no idea how far along she could be. Initially denied pain, then reported pain as 8/10. Patient has requested transvaginal ultrasound.

## 2019-02-06 ENCOUNTER — Emergency Department (HOSPITAL_COMMUNITY)
Admission: EM | Admit: 2019-02-06 | Discharge: 2019-02-06 | Disposition: A | Discharge status: Home / Self Care | Payer: Medicaid Other | Attending: Emergency Medicine | Admitting: Emergency Medicine

## 2019-02-06 ENCOUNTER — Other Ambulatory Visit: Payer: Self-pay

## 2019-02-06 ENCOUNTER — Emergency Department (HOSPITAL_COMMUNITY): Payer: Medicaid Other

## 2019-02-06 ENCOUNTER — Encounter (HOSPITAL_COMMUNITY): Payer: Self-pay

## 2019-02-06 DIAGNOSIS — M542 Cervicalgia: Secondary | ICD-10-CM | POA: Diagnosis present

## 2019-02-06 DIAGNOSIS — Z79899 Other long term (current) drug therapy: Secondary | ICD-10-CM | POA: Insufficient documentation

## 2019-02-06 DIAGNOSIS — M7918 Myalgia, other site: Secondary | ICD-10-CM | POA: Diagnosis not present

## 2019-02-06 DIAGNOSIS — Z87891 Personal history of nicotine dependence: Secondary | ICD-10-CM | POA: Diagnosis not present

## 2019-02-06 DIAGNOSIS — J029 Acute pharyngitis, unspecified: Secondary | ICD-10-CM | POA: Diagnosis not present

## 2019-02-06 DIAGNOSIS — F1210 Cannabis abuse, uncomplicated: Secondary | ICD-10-CM | POA: Diagnosis not present

## 2019-02-06 LAB — GROUP A STREP BY PCR: Group A Strep by PCR: NOT DETECTED

## 2019-02-06 MED ORDER — METHOCARBAMOL 500 MG PO TABS: 500.0000 mg | ORAL_TABLET | Freq: Two times a day (BID) | ORAL | 0 refills | Status: DC

## 2019-02-06 MED ORDER — METHOCARBAMOL 500 MG PO TABS
750.0000 mg | ORAL_TABLET | Freq: Once | ORAL | Status: AC
  Administered 2019-02-06: 750 mg via ORAL
  Filled 2019-02-06: qty 2

## 2019-02-06 MED ORDER — HYDROCODONE-ACETAMINOPHEN 5-325 MG PO TABS
1.0000 | ORAL_TABLET | Freq: Once | ORAL | Status: AC
  Administered 2019-02-06: 1 via ORAL
  Filled 2019-02-06: qty 1

## 2019-02-06 NOTE — ED Triage Notes (Signed)
Pt states she has had neck pain for over a year. She had a lymph node biopsied last year and nothing was wrong. Pt states the pain is worse and is now all around her neck and traveling down into her shoulder.

## 2019-02-06 NOTE — ED Provider Notes (Signed)
[facility] EMERGENCY DEPARTMENT Provider Note   CSN: [id number] Arrival date & time: 02/06/19  1850     History   Chief Complaint Chief Complaint  Patient presents with   Neck Pain    HPI Kristie Sandoval is a 33 y.o. female.     HPI  Patient presents for 1 year of left-sided neck pain that is worse with movement, tender to palpation, nonradiating, constant.  Patient states that over the past 2 weeks she has had worsened neck pain and also endorses throat pain, sore throat, and swollen lymph glands however she states she has had a swollen lymph node for over a year.  Patient states that she had a lymph node biopsy done 1 year ago which is when she started having worsened neck pain.  States that right-sided neck pain is worsened by stretching her head to the left.  Denies any other aggravating or mitigating factors.  States that she has tried ibuprofen which sometimes helps but not consistently.  Denies any fevers, chills, difficulty swallowing, shortness of breath, wheezing, stridor cough, drooling.  She states that she got her original lymph node biopsy done because she did a lot of reading on [employer] and thought that she had symptoms concerning for cancer.  Patient states that the lymph node biopsy was negative however she states concerned that the biopsy was not done effectively because it did not feel like he was going deep enough during the procedure.  Patient states she has not followed up with the oncologist who did the lymph node biopsy nor has she followed up with her primary care doctor since.  On further discussion patient states that she is concerned about cancer because of her reading on [employer] and because she is a current smoker.  Patient understands that this increases her risk of cancer and request CT scan or x-ray to assess at this time.  Patient denies any weight changes, night sweats, or fevers.  Patient also does endorse sore throat that she states  is moderately painful and is concerned for infection.  Denies any other HEENT complaints.  Denies any strep exposure she knows of.  Does not have a history of frequent strep throat infections.  No recent antibiotic use.  Past Medical History:  Diagnosis Date   Chlamydia infection, current pregnancy 12/12   Test of cure negative in April   GERD (gastroesophageal reflux disease)    Gonorrhea, current pregnancy 12/12   Test of cure negative in April   Headache(784.0)    no hx prior to preg   Trichomonas     There are no active problems to display for this patient.   Past Surgical History:  Procedure Laterality Date   NO PAST SURGERIES       OB History    Gravida  2   Para  1   Term  1   Preterm  0   AB  1   Living  1     SAB  1   TAB  0   Ectopic  0   Multiple  0   Live Births  1            Home Medications    Prior to Admission medications   Medication Sig Start Date End Date Taking? Authorizing Provider  acetaminophen (TYLENOL) 500 MG tablet Take 1,000 mg by mouth every 6 (six) hours as needed for mild pain.    [provider]  ciprofloxacin (CIPRO) 500 MG  tablet Take 1 tablet (500 mg total) by mouth 2 (two) times daily. 05/08/18   Azalia Bilis, MD  methocarbamol (ROBAXIN) 500 MG tablet Take 1 tablet (500 mg total) by mouth 2 (two) times daily. 02/06/19   Gailen Shelter, PA  metroNIDAZOLE (FLAGYL) 500 MG tablet Take 1 tablet (500 mg total) by mouth 3 (three) times daily. 05/08/18   Azalia Bilis, MD  naproxen (NAPROSYN) 500 MG tablet Take 1 tablet (500 mg total) by mouth 2 (two) times daily. Patient not taking: Reported on 05/07/2018 11/16/17   Sabas Sous, MD  naproxen sodium (ALEVE) 220 MG tablet Take 440 mg by mouth 2 (two) times daily as needed (pain).    [provider]  ondansetron (ZOFRAN ODT) 8 MG disintegrating tablet Take 1 tablet (8 mg total) by mouth every 8 (eight) hours as needed for nausea or vomiting. 05/08/18    Azalia Bilis, MD  raNITIdine HCl (ACID REDUCER PO) Take 2 tablets by mouth daily as needed (acid reflux).    [provider]    Family History Family History  Problem Relation Age of Onset   Diabetes Maternal Grandmother    Anesthesia problems Neg Hx    Hypotension Neg Hx    Malignant hyperthermia Neg Hx    Pseudochol deficiency Neg Hx    Other Neg Hx     Social History Social History   Tobacco Use   Smoking status: Former Smoker    Packs/day: 1.00    Years: 10.00    Pack years: 10.00    Types: Cigarettes    Quit date: 10/04/2017    Years since quitting: 1.3   Smokeless tobacco: Former Neurosurgeon    Quit date: 02/01/2012  Substance Use Topics   Alcohol use: No   Drug use: No    Types: Marijuana    Comment: no longer      Allergies   Patient has no known allergies.   Review of Systems Review of Systems  Constitutional: Negative for chills, diaphoresis and fever.  HENT: Positive for sore throat. Negative for congestion.   Respiratory: Negative for chest tightness and shortness of breath.   Cardiovascular: Negative for chest pain.  Gastrointestinal: Negative for abdominal pain.  Musculoskeletal: Positive for neck pain.  Skin: Negative for wound.  Neurological: Negative for headaches.     Physical Exam Updated Vital Signs BP 135/87 (BP Location: Right Arm)    Pulse 89    Temp 98.1 F (36.7 C)    Resp 18    Ht 5\' 4"  (1.626 m)    Wt 81.6 kg    LMP 12/22/2018 (Approximate)    SpO2 100%    BMI 30.88 kg/m   Physical Exam Vitals signs and nursing note reviewed.  Constitutional:      General: She is not in acute distress.    Comments: Patient is pleasant, 33 year old female appears stated age does appear anxious.  HENT:     Head: Normocephalic and atraumatic.     Nose: Nose normal.     Mouth/Throat:     Mouth: Mucous membranes are moist.     Pharynx: Posterior oropharyngeal erythema present. No oropharyngeal exudate.     Comments: Clear  postnasal drainage Eyes:     General: No scleral icterus. Neck:     Musculoskeletal: Normal range of motion.     Comments: Full range of motion of neck. Cardiovascular:     Rate and Rhythm: Normal rate and regular rhythm.     Pulses:  Normal pulses.     Heart sounds: Normal heart sounds.  Pulmonary:     Effort: Pulmonary effort is normal. No respiratory distress.     Breath sounds: No wheezing.  Abdominal:     Palpations: Abdomen is soft.     Tenderness: There is no abdominal tenderness.  Musculoskeletal:     Right lower leg: No edema.     Left lower leg: No edema.     Comments: Back tenderness.  No paralumbar muscular tenderness.  Tenderness to palpation over right trapezius into the right side of the lateral neck.  Lymphadenopathy:     Cervical: Cervical adenopathy (Patient has single, freely mobile nontender lymph node palpable on the right anterior cervical chain.) present.  Skin:    General: Skin is warm and dry.     Capillary Refill: Capillary refill takes less than 2 seconds.  Neurological:     Mental Status: She is alert. Mental status is at baseline.  Psychiatric:        Mood and Affect: Mood normal.        Behavior: Behavior normal.      ED Treatments / Results  Labs (all labs ordered are listed, but only abnormal results are displayed) Labs Reviewed  GROUP A STREP BY PCR    EKG None  Radiology Dg Cervical Spine Complete  Result Date: 02/06/2019 CLINICAL DATA:  Neck pain EXAM: CERVICAL SPINE - COMPLETE 4+ VIEW COMPARISON:  None. FINDINGS: Mild straightening of the cervical spine. Normal prevertebral soft tissue thickness. Vertebral body heights and disc spaces are within normal limits. Dens and lateral masses are unremarkable. IMPRESSION: Straightening of the cervical spine.  No acute osseous abnormality Electronically Signed   By: Jasmine PangKim  Fujinaga M.D.   On: 02/06/2019 22:18    Procedures Procedures (including critical care time)  Medications Ordered in  ED Medications  methocarbamol (ROBAXIN) tablet 750 mg (750 mg Oral Given 02/06/19 2104)  HYDROcodone-acetaminophen (NORCO/VICODIN) 5-325 MG per tablet 1 tablet (1 tablet Oral Given 02/06/19 2104)     Initial Impression / Assessment and Plan / ED Course  I have reviewed the triage vital signs and the nursing notes.  Pertinent labs & imaging results that were available during my care of the patient were reviewed by me and considered in my medical decision making (see chart for details).        Patient 33 year old female presenting today with sore throat and acute on chronic right-sided neck pain.  History and physical exam consistent with muscular strain.  No trauma, MVC, infectious symptoms or other concerning findings.  Patient sore throat is most likely viral pharyngitis negative strep test collected.  Patient given conservative management recommendations.  X-ray of cervical spine shows loss of cervical lordosis likely this is consistent with poor posture musculoskeletal strain of cervical muscles causing neck pain.  During patient discussion it was discovered that primary concern is for cancer of the throat as patient is a current smoker and is concerned that she will develop cancer.  Discussed at length with patient the likelihood of developing cancer is increased by smoking.  The patient was counseled on the dangers of tobacco use, and was advised to quit. Reviewed strategies to maximize success, including removing cigarettes and smoking materials from environment, stress management, substitution of other forms of reinforcement, support of family/friends and written materials.  Patient to follow-up with primary care provider and given information for [place] and wellness clinic and encouraged her to discuss Wellbutrin or Chantix for smoking  cessation encourage patient to go ahead and quit smoking at this time.  Patient agreeable to plan.  I provided patient with a copy of her  cervical x-ray.  Patient discharged with Robaxin for musculoskeletal pain.  She will follow-up with and establish PCP care.  Vital signs are within normal limits at discharge.    This patient appears reasonably screened and I doubt any other medical condition requiring further workup, evaluation, or treatment in the ED at this time prior to discharge.   Patient's vitals are WNL apart from vital sign abnormalities discussed above, patient is in NAD, and able to ambulate in the ED at their baseline. Pain has been managed or a plan has been made for home management and has no complaints prior to discharge. Patient is comfortable with above plan and is stable for discharge at this time. All questions were answered prior to disposition. Results from the ER workup discussed with the patient face to face and all questions answered to the best of my ability. The patient is safe for discharge with strict return precautions. Patient appears safe for discharge with appropriate follow-up. Conveyed my impression with the patient and they voiced understanding and are agreeable to plan.   An After Visit Summary was printed and given to the patient.  Portions of this note were generated with [employer]. Dictation errors may occur despite best attempts at proofreading.    Final Clinical Impressions(s) / ED Diagnoses   Final diagnoses:  Musculoskeletal pain  Viral pharyngitis    ED Discharge Orders         Ordered    methocarbamol (ROBAXIN) 500 MG tablet  2 times daily     02/06/19 2229           Pati Gallo [place], [place] 02/06/19 2317    Hayden Rasmussen, MD 02/07/19 [phone]

## 2019-02-06 NOTE — Discharge Instructions (Signed)
Xray findings consistent with muscle spasm. Soft tissues of the neck appear normal. Please follow up with primary care doctor as discussed.  Quitting smoking has significant and immediate health benefits for men and women of all ages. The sooner you quit, the greater the benefits. People who quit smoking before age 31 reduce their risk of dying over the next 15 years by one-half, as compared with those who continue to smoke. As we discussed, smoking is the most beneficial change you can make for your health.    PREPARING TO QUIT Smoking is recognized as a chronic addictive disease, and for some (but not all) smokers it can be extremely challenging to quit. People can differ greatly in the way in which they smoke, their success in quitting, symptoms they have when trying to quit, and factors that may lead to relapse. Many people try to quit on their own, without any help from medications or other supports. The success rate in this situation is much lower. If you tried to quit on your own without success, seeking help the next time you try to quit can make the difference.  Once you decide to quit smoking, the first step is usually to set a quit date. This is the day when you will completely quit smoking. Ideally, this date should be in the next two weeks, although some people choose a special date that is significant to them (eg, a birthday, anniversary, or holiday).  Many doctors recommend stopping smoking all at once on your quit date. However, some people prefer to gradually reduce the number of cigarettes they smoke prior to the quit date.  Once you have set your quit date, there are things you can do to help you prepare. It can help to:  ?Talk with a health care provider about ways to quit smoking. Changing behaviors and taking a medication are the two main methods of quitting smoking. Using both methods together increases your chances of successfully quitting.  ?Think about what happened with  your previous quit attempts. What worked? What did not work? What contributed to relapse? Is there anything you have learned that you can do differently this time to be more successful?  ?Tell your family, friends, and coworkers about the plan to quit and ask for their support.  ?Prepare to deal with nicotine withdrawal symptoms, such as cravings for a cigarette. Other symptoms are more general and often not recognized as related to nicotine withdrawal. These include anxiety, difficulty sleeping, irritability, difficulty concentrating, restlessness, frustration or anger, and depression. These symptoms are reduced and more tolerable if you use a stop-smoking medication. These include over-the-counter medications, like nicotine patches, gum, or lozenges, or prescription medications like varenicline or bupropion. (See 'Medications for quitting' below.)  Withdrawal symptoms usually become manageable within a few weeks of stopping smoking completely. Symptoms generally peak in the first three days and decrease over the next three to four weeks, so keep in mind that it will get easier. Symptoms vary in intensity but are generally stronger in heavier smokers.  ?Prepare to deal with things that trigger smoking. Examples include having other smokers in the household or workplace, stressful situations, and drinking alcohol. If you are in the habit of smoking during the work day, it might be easier to quit during a vacation from work. If you live with someone else who smokes, consider asking that person not to smoke in the house or car to help you succeed in becoming smoke-free.  ?Anticipate occasional cravings for cigarettes;  these can be intense and may occur periodically over a longer timeframe than withdrawal symptoms. Cravings may be brought on by situations associated with smoking, by stress, or by drinking alcohol. These cravings are a common reason people relapse after quitting. It is important to remember  that a craving will typically go away in a few minutes if you distract yourself by doing something else for a few minutes.  ?Plan an exercise program and a healthy diet to minimize weight gain. Weight gain may occur because people tend to eat more after quitting. Typically, people gain two to five pounds in the first two weeks, followed by an additional four to seven pounds over the next four to five months. The average total weight gain is 8 to 10 pounds. While this can be frustrating, it can help to keep in mind that the benefits of quitting smoking are much greater than the risks associated with gaining weight. (See "Patient education: Exercise (Beyond the Basics)".)  BEHAVIORAL CHANGES TO HELP YOU QUIT You can make changes in your daily behavior to help you quit smoking on your own; some people also choose to participate in individual or group support sessions (see 'In-person support' below). Combining behavioral changes with a medication increases your chances of quitting successfully. (See 'Medications for quitting' below.)  Problem solving/skills training -- When preparing to quit, it is important to identify situations or activities that increase your risk of smoking or relapse. After identifying these situations, you may need to develop new coping skills. This may include one or more of the following:  ?Make lifestyle changes to reduce stress and improve quality of life, such as starting an exercise program or learning relaxation techniques. Vigorous exercise can enhance the ability to stop smoking and avoid relapse and also helps to minimize or avoid weight gain.  ?Minimize time with smokers and in places where smoking is allowed. People who live with smokers can consider negotiating with them to stop smoking at home or in the car.  ?Recognize that cravings frequently lead to relapse. Cravings can be prevented to some degree by avoiding situations associated with smoking, by minimizing stress,  and by avoiding alcohol. Cravings will subside. Keep oral substitutes (such as sugarless gum, carrots, sunflower seeds, etc) handy for when cravings develop.  ?Try to avoid thoughts like "having one cigarette will not hurt"; one cigarette typically leads to many more.  ?Have as much information as possible about what to expect during a quit attempt and how to cope during this time. These can easily be found online, by calling a smokers' quitline, or by talking with a health care provider or counselor. Support groups can be helpful. Some medical centers have patient resources or learning centers with self-help materials. (See 'Where to get more information' below.)  Support -- Having consistent support is extremely helpful in quitting smoking and staying off cigarettes. Support can come from family and friends, a health care provider, a Veterinary surgeoncounselor, a telephone hotline (in the [place], 1-800-QUIT-NOW), a text messaging program (in the [place], sign up at [employer]smokefree.gov), online resources, and/or support groups. In addition to getting ongoing encouragement, it is important to have someone you can talk to about any problems you have while trying to quit, such as weight gain, lack of support from family and friends, or prolonged withdrawal symptoms.  In-person support -- Some people find it helpful to talk with a "coach" who can help support you throughout the process. This often involves regular visits beginning before  your quit date and continuing for several months afterwards.  Group counseling sessions are another option; many different organizations offer group programs. These typically include lectures, group meetings for mutual support, discussion of coping skills, and suggestions for preventing relapse.  Hypnosis and acupuncture -- Hypnosis and acupuncture are popular stop-smoking methods. Although there is not convincing scientific evidence that these are effective, some people who have  not had success with other techniques find these treatments to be helpful.

## 2019-03-19 ENCOUNTER — Ambulatory Visit (HOSPITAL_COMMUNITY)
Admission: EM | Admit: 2019-03-19 | Discharge: 2019-03-19 | Disposition: A | Discharge status: Home / Self Care | Payer: Medicaid Other | Attending: Family Medicine | Admitting: Family Medicine

## 2019-03-19 ENCOUNTER — Other Ambulatory Visit: Payer: Self-pay

## 2019-03-19 ENCOUNTER — Encounter (HOSPITAL_COMMUNITY): Payer: Self-pay | Admitting: Emergency Medicine

## 2019-03-19 DIAGNOSIS — N644 Mastodynia: Secondary | ICD-10-CM | POA: Diagnosis not present

## 2019-03-19 NOTE — ED Triage Notes (Signed)
Patient complains of bilateral breast pain.  History of the same.  Patient is able to feel knots in breast tissue

## 2019-03-19 NOTE — Discharge Instructions (Signed)
Again, decrease caffeine intake Take Aleve rather than Tylenol for discomfort

## 2019-03-19 NOTE — ED Provider Notes (Signed)
[facility]    CSN: [id number] Arrival date & time: 03/19/19  1530      History   Chief Complaint Chief Complaint  Patient presents with  . Breast Problem    HPI Kristie Sandoval is a 33 y.o. female.   Patient has almost chronic daily pain in her breast.  She was seen here little over 1 year ago with similar symptoms.  I cannot get a good history of cyclical nature.  Patient takes Tylenol.  There is family history of breast cancer in her mother.  We spent some time talking about breast disease.  I explained that breast cancer does not usually hurt except in the advanced stages. HPI  Past Medical History:  Diagnosis Date  . Chlamydia infection, current pregnancy 12/12   Test of cure negative in April  . GERD (gastroesophageal reflux disease)   . Gonorrhea, current pregnancy 12/12   Test of cure negative in April  . Headache(784.0)    no hx prior to preg  . Trichomonas     There are no problems to display for this patient.   Past Surgical History:  Procedure Laterality Date  . NO PAST SURGERIES      OB History    Gravida  2   Para  1   Term  1   Preterm  0   AB  1   Living  1     SAB  1   TAB  0   Ectopic  0   Multiple  0   Live Births  1            Home Medications    Prior to Admission medications   Medication Sig Start Date End Date Taking? Authorizing Provider  acetaminophen (TYLENOL) 500 MG tablet Take 1,000 mg by mouth every 6 (six) hours as needed for mild pain.    [provider]  ciprofloxacin (CIPRO) 500 MG tablet Take 1 tablet (500 mg total) by mouth 2 (two) times daily. 05/08/18   Azalia Bilis, MD  methocarbamol (ROBAXIN) 500 MG tablet Take 1 tablet (500 mg total) by mouth 2 (two) times daily. 02/06/19   Gailen Shelter, PA  metroNIDAZOLE (FLAGYL) 500 MG tablet Take 1 tablet (500 mg total) by mouth 3 (three) times daily. 05/08/18   Azalia Bilis, MD  naproxen (NAPROSYN) 500 MG tablet Take 1 tablet (500 mg  total) by mouth 2 (two) times daily. Patient not taking: Reported on 05/07/2018 11/16/17   Sabas Sous, MD  naproxen sodium (ALEVE) 220 MG tablet Take 440 mg by mouth 2 (two) times daily as needed (pain).    [provider]  ondansetron (ZOFRAN ODT) 8 MG disintegrating tablet Take 1 tablet (8 mg total) by mouth every 8 (eight) hours as needed for nausea or vomiting. 05/08/18   Azalia Bilis, MD  raNITIdine HCl (ACID REDUCER PO) Take 2 tablets by mouth daily as needed (acid reflux).    [provider]    Family History Family History  Problem Relation Age of Onset  . Diabetes Maternal Grandmother   . Anesthesia problems Neg Hx   . Hypotension Neg Hx   . Malignant hyperthermia Neg Hx   . Pseudochol deficiency Neg Hx   . Other Neg Hx     Social History Social History   Tobacco Use  . Smoking status: Former Smoker    Packs/day: 1.00    Years: 10.00    Pack years: 10.00  Types: Cigarettes    Quit date: 10/04/2017    Years since quitting: 1.4  . Smokeless tobacco: Former Systems developer    Quit date: 02/01/2012  Substance Use Topics  . Alcohol use: No    Comment: rarely  . Drug use: Not Currently    Types: Marijuana    Comment: no longer      Allergies   Patient has no known allergies.   Review of Systems Review of Systems  Endocrine: Negative.        Planes of bilateral breast pain  All other systems reviewed and are negative.    Physical Exam Triage Vital Signs ED Triage Vitals  Enc Vitals Group     BP 03/19/19 1619 (!) 150/111     Pulse Rate 03/19/19 1619 89     Resp 03/19/19 1619 20     Temp 03/19/19 1619 98.2 F (36.8 C)     Temp Source 03/19/19 1619 Oral     SpO2 03/19/19 1619 100 %     Weight --      Height --      Head Circumference --      Peak Flow --      Pain Score 03/19/19 1620 10     Pain Loc --      Pain Edu? --      Excl. in [place]? --    No data found.  Updated Vital Signs BP (!) 149/102 (BP Location: Right Arm) Comment: large  cuff Comment (BP Location): repositioned  Pulse 89   Temp 98.2 F (36.8 C) (Oral)   Resp 20   LMP 02/27/2019   SpO2 100%   Visual Acuity Right Eye Distance:   Left Eye Distance:   Bilateral Distance:    Right Eye Near:   Left Eye Near:    Bilateral Near:     Physical Exam Nursing note reviewed. Exam conducted with a chaperone present.  Chest:     Chest wall: Tenderness present. No mass.     Comments: Multiple small cystic nodules that are mobile.  This has consistency of fibrocystic disease to my exam Lymphadenopathy:     Upper Body:     Right upper body: No supraclavicular or axillary adenopathy.     Left upper body: No supraclavicular or axillary adenopathy.  Neurological:     Mental Status: She is alert.      UC Treatments / Results  Labs (all labs ordered are listed, but only abnormal results are displayed) Labs Reviewed - No data to display  EKG   Radiology No results found.  Procedures Procedures (including critical care time)  Medications Ordered in UC Medications - No data to display  Initial Impression / Assessment and Plan / UC Course  I have reviewed the triage vital signs and the nursing notes.  Pertinent labs & imaging results that were available during my care of the patient were reviewed by me and considered in my medical decision making (see chart for details).     Breast pain likely secondary to fibrocystic disease Final Clinical Impressions(s) / UC Diagnoses   Final diagnoses:  None   Discharge Instructions   None    ED Prescriptions    None     PDMP not reviewed this encounter.   Wardell Honour, MD 03/19/19 [id number]

## 2019-03-21 ENCOUNTER — Other Ambulatory Visit: Payer: Self-pay | Admitting: Family Medicine

## 2019-05-06 ENCOUNTER — Encounter (HOSPITAL_COMMUNITY): Payer: Self-pay

## 2019-05-06 ENCOUNTER — Other Ambulatory Visit: Payer: Self-pay

## 2019-05-06 ENCOUNTER — Ambulatory Visit (HOSPITAL_COMMUNITY)
Admission: EM | Admit: 2019-05-06 | Discharge: 2019-05-06 | Disposition: A | Discharge status: Home / Self Care | Payer: Medicaid Other | Attending: Family Medicine | Admitting: Family Medicine

## 2019-05-06 DIAGNOSIS — Z206 Contact with and (suspected) exposure to human immunodeficiency virus [HIV]: Secondary | ICD-10-CM | POA: Insufficient documentation

## 2019-05-06 LAB — COMPREHENSIVE METABOLIC PANEL
ALT: 19 U/L (ref 0–44)
AST: 19 U/L (ref 15–41)
Albumin: 3.9 g/dL (ref 3.5–5.0)
Alkaline Phosphatase: 61 U/L (ref 38–126)
Anion gap: 12 (ref 5–15)
BUN: 8 mg/dL (ref 6–20)
CO2: 23 mmol/L (ref 22–32)
Calcium: 9.2 mg/dL (ref 8.9–10.3)
Chloride: 103 mmol/L (ref 98–111)
Creatinine, Ser: 0.85 mg/dL (ref 0.44–1.00)
GFR calc Af Amer: >60 mL/min (ref >60)
GFR calc non Af Amer: >60 mL/min (ref >60)
Glucose, Bld: 118 mg/dL — ABNORMAL HIGH (ref 70–99)
Potassium: 3.5 mmol/L (ref 3.5–5.1)
Sodium: 138 mmol/L (ref 135–145)
Total Bilirubin: 0.1 mg/dL — ABNORMAL LOW (ref 0.3–1.2)
Total Protein: 7.2 g/dL (ref 6.5–8.1)

## 2019-05-06 LAB — CBC WITH DIFFERENTIAL/PLATELET
Abs Immature Granulocytes: 0.02 10*3/uL (ref 0.00–0.07)
Basophils Absolute: 0.1 10*3/uL (ref 0.0–0.1)
Basophils Relative: 1 %
Eosinophils Absolute: 0.2 10*3/uL (ref 0.0–0.5)
Eosinophils Relative: 2 %
HCT: 37.8 % (ref 36.0–46.0)
Hemoglobin: 12.8 g/dL (ref 12.0–15.0)
Immature Granulocytes: 0 %
Lymphocytes Relative: 33 %
Lymphs Abs: 3.1 10*3/uL (ref 0.7–4.0)
MCH: 31.6 pg (ref 26.0–34.0)
MCHC: 33.9 g/dL (ref 30.0–36.0)
MCV: 93.3 fL (ref 80.0–100.0)
Monocytes Absolute: 0.7 10*3/uL (ref 0.1–1.0)
Monocytes Relative: 7 %
Neutro Abs: 5.3 10*3/uL (ref 1.7–7.7)
Neutrophils Relative %: 57 %
Platelets: 316 10*3/uL (ref 150–400)
RBC: 4.05 MIL/uL (ref 3.87–5.11)
RDW: 13.4 % (ref 11.5–15.5)
WBC: 9.2 10*3/uL (ref 4.0–10.5)
nRBC: 0 % (ref 0.0–0.2)

## 2019-05-06 MED ORDER — DOLUTEGRAVIR SODIUM 50 MG PO TABS: 50.0000 mg | ORAL_TABLET | Freq: Every day | ORAL | 0 refills | Status: DC

## 2019-05-06 MED ORDER — EMTRICITABINE-TENOFOVIR DF 200-300 MG PO TABS: 1.0000 | ORAL_TABLET | Freq: Every day | ORAL | 0 refills | Status: DC

## 2019-05-06 NOTE — ED Provider Notes (Signed)
[facility]    CSN: [id number] Arrival date & time: 05/06/19  0802      History   Chief Complaint Chief Complaint  Patient presents with  . HIV Exposure    HPI Kristie Sandoval is a 34 y.o. female.   Patient is a 34 year old female past medical history of chlamydia, GERD, gonorrhea, trichomonas.  She presents today for possible HIV exposure.  Reporting had a tattoo done at 5 AM and believes the needles were not cleaned and the ink was not changed.  She is concerned because he client prior to her may have HIV.  She is here for possible  post exposure treatment.     Past Medical History:  Diagnosis Date  . Chlamydia infection, current pregnancy 12/12   Test of cure negative in April  . GERD (gastroesophageal reflux disease)   . Gonorrhea, current pregnancy 12/12   Test of cure negative in April  . Headache(784.0)    no hx prior to preg  . Trichomonas     There are no problems to display for this patient.   Past Surgical History:  Procedure Laterality Date  . NO PAST SURGERIES      OB History    Gravida  2   Para  1   Term  1   Preterm  0   AB  1   Living  1     SAB  1   TAB  0   Ectopic  0   Multiple  0   Live Births  1            Home Medications    Prior to Admission medications   Medication Sig Start Date End Date Taking? Authorizing Provider  acetaminophen (TYLENOL) 500 MG tablet Take 1,000 mg by mouth every 6 (six) hours as needed for mild pain.    [provider]  ciprofloxacin (CIPRO) 500 MG tablet Take 1 tablet (500 mg total) by mouth 2 (two) times daily. 05/08/18   Azalia Bilis, MD  dolutegravir (TIVICAY) 50 MG tablet Take 1 tablet (50 mg total) by mouth daily. 05/06/19   Mardella Layman, MD  emtricitabine-tenofovir (TRUVADA) 200-300 MG tablet Take 1 tablet by mouth daily. 05/06/19   Dahlia Byes A, NP  methocarbamol (ROBAXIN) 500 MG tablet Take 1 tablet (500 mg total) by mouth 2 (two) times daily. 02/06/19    Gailen Shelter, PA  metroNIDAZOLE (FLAGYL) 500 MG tablet Take 1 tablet (500 mg total) by mouth 3 (three) times daily. 05/08/18   Azalia Bilis, MD  ondansetron (ZOFRAN ODT) 8 MG disintegrating tablet Take 1 tablet (8 mg total) by mouth every 8 (eight) hours as needed for nausea or vomiting. 05/08/18   Azalia Bilis, MD  raNITIdine HCl (ACID REDUCER PO) Take 2 tablets by mouth daily as needed (acid reflux).    [provider]    Family History Family History  Problem Relation Age of Onset  . Diabetes Maternal Grandmother   . Anesthesia problems Neg Hx   . Hypotension Neg Hx   . Malignant hyperthermia Neg Hx   . Pseudochol deficiency Neg Hx   . Other Neg Hx     Social History Social History   Tobacco Use  . Smoking status: Former Smoker    Packs/day: 1.00    Years: 10.00    Pack years: 10.00    Types: Cigarettes    Quit date: 10/04/2017    Years since quitting: 1.5  . Smokeless tobacco:  Former Systems developer    Quit date: 02/01/2012  Substance Use Topics  . Alcohol use: No    Comment: rarely  . Drug use: Not Currently    Types: Marijuana    Comment: no longer      Allergies   Patient has no known allergies.   Review of Systems Review of Systems  Constitutional: Negative for activity change, appetite change, chills, fatigue and fever.  Gastrointestinal: Negative for blood in stool, nausea and vomiting.  Musculoskeletal: Negative for arthralgias and myalgias.  Skin: Negative for rash.     Physical Exam Triage Vital Signs ED Triage Vitals  Enc Vitals Group     BP 05/06/19 0822 (!) 143/89     Pulse Rate 05/06/19 0822 (!) 105     Resp 05/06/19 0822 18     Temp 05/06/19 0822 98.1 F (36.7 C)     Temp Source 05/06/19 0822 Oral     SpO2 05/06/19 0822 100 %     Weight 05/06/19 0817 198 lb (89.8 kg)     Height --      Head Circumference --      Peak Flow --      Pain Score 05/06/19 0816 0     Pain Loc --      Pain Edu? --      Excl. in [place]? --    No data  found.  Updated Vital Signs BP (!) 143/89 (BP Location: Right Arm)   Pulse (!) 105   Temp 98.1 F (36.7 C) (Oral)   Resp 18   Wt 198 lb (89.8 kg)   LMP 04/30/2019   SpO2 100%   BMI 33.99 kg/m   Visual Acuity Right Eye Distance:   Left Eye Distance:   Bilateral Distance:    Right Eye Near:   Left Eye Near:    Bilateral Near:     Physical Exam Vitals and nursing note reviewed.  Constitutional:      General: She is not in acute distress.    Appearance: Normal appearance. She is not ill-appearing, toxic-appearing or diaphoretic.  HENT:     Head: Normocephalic.     Nose: Nose normal.  Eyes:     Conjunctiva/sclera: Conjunctivae normal.  Pulmonary:     Effort: Pulmonary effort is normal.  Musculoskeletal:        General: Normal range of motion.     Cervical back: Normal range of motion.  Skin:    General: Skin is warm and dry.     Findings: No rash.  Neurological:     Mental Status: She is alert.  Psychiatric:        Mood and Affect: Mood normal.      UC Treatments / Results  Labs (all labs ordered are listed, but only abnormal results are displayed) Labs Reviewed  COMPREHENSIVE METABOLIC PANEL - Abnormal; Notable for the following components:      Result Value   Glucose, Bld 118 (*)    Total Bilirubin 0.1 (*)    All other components within normal limits  CBC WITH DIFFERENTIAL/PLATELET    EKG   Radiology No results found.  Procedures Procedures (including critical care time)  Medications Ordered in UC Medications - No data to display  Initial Impression / Assessment and Plan / UC Course  I have reviewed the triage vital signs and the nursing notes.  Pertinent labs & imaging results that were available during my care of the patient were reviewed by me and considered in  my medical decision making (see chart for details).     Possible HIV exposure-treating with Truvada based on cost and medicaid coverage.  Recommended blood work but she refused.     Final Clinical Impressions(s) / UC Diagnoses   Final diagnoses:  HIV exposure     Discharge Instructions     Take the medication as prescribed Follow up as needed for continued or worsening symptoms.  Return here in two and four weeks for repeat blood work.    ED Prescriptions    Medication Sig Dispense Auth. Provider   emtricitabine-tenofovir (TRUVADA) 200-300 MG tablet Take 1 tablet by mouth daily. 28 tablet Renesmee Raine A, NP   dolutegravir (TIVICAY) 50 MG tablet Take 1 tablet (50 mg total) by mouth daily. 28 tablet Mardella Layman, MD     PDMP not reviewed this encounter.   Dahlia Byes A, NP 05/06/19 1401

## 2019-05-06 NOTE — ED Provider Notes (Signed)
[name]   [id number] 05/06/19 Arrival Time: 9629  ASSESSMENT & PLAN:  1. HIV exposure    Seen with Hal Morales NP. Addendum. She returns to discuss HIV PEP. She would like triple therapy. Prescribed today. Baseline labs drawn. Return instructions given. Discussed very low risk of acquiring HIV via tattoo needle. She wishes to proceed with PCP.  Meds ordered this encounter  Medications  . emtricitabine-tenofovir (TRUVADA) 200-300 MG tablet    Sig: Take 1 tablet by mouth daily.    Dispense:  28 tablet    Refill:  0  . dolutegravir (TIVICAY) 50 MG tablet    Sig: Take 1 tablet (50 mg total) by mouth daily.    Dispense:  28 tablet    Refill:  0    Pending:  Labs Reviewed  CBC WITH DIFFERENTIAL/PLATELET  COMPREHENSIVE METABOLIC PANEL      Discharge Instructions     Take the medication as prescribed Follow up as needed for continued or worsening symptoms.  Return here in two and four weeks for repeat blood work.     Reviewed expectations re: course of current medical issues. Questions answered. Outlined signs and symptoms indicating need for more acute intervention. Patient verbalized understanding. After Visit Summary given.   SUBJECTIVE: History from: patient. Kristie Sandoval is a 34 y.o. female who was seen ealier by T. Rozanna Box NP. See her note in chart for further details. Patient has a few questions which I attempted to answer to the best of my ability. Potential HIV exposure.   OBJECTIVE:  Vitals:   05/06/19 0817 05/06/19 0822  BP:  (!) 143/89  Pulse:  (!) 105  Resp:  18  Temp:  98.1 F (36.7 C)  TempSrc:  Oral  SpO2:  100%  Weight: 89.8 kg     General appearance: alert; no distress Psychological: alert and cooperative; normal mood and affect  Labs: Labs Reviewed  CBC WITH DIFFERENTIAL/PLATELET  COMPREHENSIVE METABOLIC PANEL     No Known Allergies  Past Medical History:  Diagnosis Date  . Chlamydia infection, current pregnancy  12/12   Test of cure negative in April  . GERD (gastroesophageal reflux disease)   . Gonorrhea, current pregnancy 12/12   Test of cure negative in April  . Headache(784.0)    no hx prior to preg  . Trichomonas    Social History   Socioeconomic History  . Marital status: Single    Spouse name: Not on file  . Number of children: Not on file  . Years of education: Not on file  . Highest education level: Not on file  Occupational History  . Not on file  Tobacco Use  . Smoking status: Former Smoker    Packs/day: 1.00    Years: 10.00    Pack years: 10.00    Types: Cigarettes    Quit date: 10/04/2017    Years since quitting: 1.5  . Smokeless tobacco: Former Systems developer    Quit date: 02/01/2012  Substance and Sexual Activity  . Alcohol use: No    Comment: rarely  . Drug use: Not Currently    Types: Marijuana    Comment: no longer   . Sexual activity: Yes    Birth control/protection: None  Other Topics Concern  . Not on file  Social History Narrative  . Not on file   Social Determinants of Health   Financial Resource Strain:   . Difficulty of Paying Living Expenses: Not on file  Food Insecurity:   .  Worried About Programme researcher, broadcasting/film/video in the Last Year: Not on file  . Ran Out of Food in the Last Year: Not on file  Transportation Needs:   . Lack of Transportation (Medical): Not on file  . Lack of Transportation (Non-Medical): Not on file  Physical Activity:   . Days of Exercise per Week: Not on file  . Minutes of Exercise per Session: Not on file  Stress:   . Feeling of Stress : Not on file  Social Connections:   . Frequency of Communication with Friends and Family: Not on file  . Frequency of Social Gatherings with Friends and Family: Not on file  . Attends Religious Services: Not on file  . Active Member of Clubs or Organizations: Not on file  . Attends Banker Meetings: Not on file  . Marital Status: Not on file  Intimate Partner Violence:   . Fear of  Current or Ex-Partner: Not on file  . Emotionally Abused: Not on file  . Physically Abused: Not on file  . Sexually Abused: Not on file   Family History  Problem Relation Age of Onset  . Diabetes Maternal Grandmother   . Anesthesia problems Neg Hx   . Hypotension Neg Hx   . Malignant hyperthermia Neg Hx   . Pseudochol deficiency Neg Hx   . Other Neg Hx    Past Surgical History:  Procedure Laterality Date  . NO PAST SURGERIES       Mardella Layman, MD 05/08/19 [phone]

## 2019-05-06 NOTE — Discharge Instructions (Addendum)
Take the medication as prescribed Follow up as needed for continued or worsening symptoms.  Return here in two and four weeks for repeat blood work.

## 2019-05-06 NOTE — ED Triage Notes (Signed)
Pt state she has a tattoo that was done this moring at 5 am. Pt state she thinks that she was  exposed to HIV . Pt state the person that did the tattoo did not change the  needle or ink form the person before her. Pt state the person before there's a roamer that the person before her has HIV. Pt want to get a preventive  med for HIV.

## 2019-06-19 ENCOUNTER — Other Ambulatory Visit: Payer: Self-pay

## 2019-06-19 DIAGNOSIS — X58XXXA Exposure to other specified factors, initial encounter: Secondary | ICD-10-CM | POA: Diagnosis not present

## 2019-06-19 DIAGNOSIS — S0591XA Unspecified injury of right eye and orbit, initial encounter: Secondary | ICD-10-CM | POA: Diagnosis present

## 2019-06-19 DIAGNOSIS — Z79899 Other long term (current) drug therapy: Secondary | ICD-10-CM | POA: Insufficient documentation

## 2019-06-19 DIAGNOSIS — S0501XA Injury of conjunctiva and corneal abrasion without foreign body, right eye, initial encounter: Secondary | ICD-10-CM | POA: Insufficient documentation

## 2019-06-19 DIAGNOSIS — H53149 Visual discomfort, unspecified: Secondary | ICD-10-CM | POA: Insufficient documentation

## 2019-06-19 DIAGNOSIS — Y9389 Activity, other specified: Secondary | ICD-10-CM | POA: Insufficient documentation

## 2019-06-19 DIAGNOSIS — S0502XA Injury of conjunctiva and corneal abrasion without foreign body, left eye, initial encounter: Secondary | ICD-10-CM | POA: Insufficient documentation

## 2019-06-19 DIAGNOSIS — J302 Other seasonal allergic rhinitis: Secondary | ICD-10-CM | POA: Diagnosis not present

## 2019-06-19 DIAGNOSIS — Y929 Unspecified place or not applicable: Secondary | ICD-10-CM | POA: Diagnosis not present

## 2019-06-19 DIAGNOSIS — Y998 Other external cause status: Secondary | ICD-10-CM | POA: Diagnosis not present

## 2019-06-19 DIAGNOSIS — Z87891 Personal history of nicotine dependence: Secondary | ICD-10-CM | POA: Diagnosis not present

## 2019-06-19 NOTE — ED Triage Notes (Signed)
Patient presents from home with complaints of eye irritation and pain. Patient was wearing colored contacts that she did not remove for two days. Once removed, patient began having eye pain and light sensitivity. Patient states no changes in vision, eyes noted to be a little red.

## 2019-06-20 ENCOUNTER — Emergency Department (HOSPITAL_COMMUNITY)
Admission: EM | Admit: 2019-06-20 | Discharge: 2019-06-20 | Disposition: A | Discharge status: Home / Self Care | Payer: Medicaid Other | Attending: Emergency Medicine | Admitting: Emergency Medicine

## 2019-06-20 ENCOUNTER — Encounter (HOSPITAL_COMMUNITY): Payer: Self-pay | Admitting: Emergency Medicine

## 2019-06-20 DIAGNOSIS — S0501XA Injury of conjunctiva and corneal abrasion without foreign body, right eye, initial encounter: Secondary | ICD-10-CM

## 2019-06-20 DIAGNOSIS — J302 Other seasonal allergic rhinitis: Secondary | ICD-10-CM

## 2019-06-20 MED ORDER — FLUORESCEIN SODIUM 1 MG OP STRP
1.0000 | ORAL_STRIP | Freq: Once | OPHTHALMIC | Status: AC
  Administered 2019-06-20: 1 via OPHTHALMIC
  Filled 2019-06-20: qty 1

## 2019-06-20 MED ORDER — KETOROLAC TROMETHAMINE 0.5 % OP SOLN
1.0000 [drp] | Freq: Once | OPHTHALMIC | Status: AC
  Administered 2019-06-20: 1 [drp] via OPHTHALMIC
  Filled 2019-06-20: qty 5

## 2019-06-20 MED ORDER — CLARITIN-D 24 HOUR 10-240 MG PO TB24: 1.0000 | ORAL_TABLET | Freq: Every day | ORAL | 0 refills | Status: DC

## 2019-06-20 MED ORDER — HYDROXYZINE HCL 10 MG PO TABS
10.0000 mg | ORAL_TABLET | Freq: Once | ORAL | Status: AC
  Administered 2019-06-20: 10 mg via ORAL
  Filled 2019-06-20: qty 1

## 2019-06-20 MED ORDER — OFLOXACIN 0.3 % OP SOLN
1.0000 [drp] | Freq: Once | OPHTHALMIC | Status: AC
  Administered 2019-06-20: 1 [drp] via OPHTHALMIC
  Filled 2019-06-20: qty 5

## 2019-06-20 MED ORDER — TETRACAINE HCL 0.5 % OP SOLN
2.0000 [drp] | Freq: Once | OPHTHALMIC | Status: AC
  Administered 2019-06-20: 2 [drp] via OPHTHALMIC
  Filled 2019-06-20: qty 4

## 2019-06-20 NOTE — ED Provider Notes (Signed)
[facility]-EMERGENCY DEPT Provider Note   CSN: [id number] Arrival date & time: 06/19/19  2351     History Chief Complaint  Patient presents with  . Eye Problem    Kristie Sandoval is a 34 y.o. female.  The history is provided by the patient. No language interpreter was used.  Eye Pain This is a new problem. The current episode started 12 to 24 hours ago. The problem occurs constantly. The problem has not changed since onset.Pertinent negatives include no chest pain, no abdominal pain, no headaches and no shortness of breath. Nothing aggravates the symptoms. Nothing relieves the symptoms. She has tried a cold compress (ibuprofen) for the symptoms. The treatment provided no relief.       Past Medical History:  Diagnosis Date  . Chlamydia infection, current pregnancy 12/12   Test of cure negative in April  . GERD (gastroesophageal reflux disease)   . Gonorrhea, current pregnancy 12/12   Test of cure negative in April  . Headache(784.0)    no hx prior to preg  . Trichomonas     There are no problems to display for this patient.   Past Surgical History:  Procedure Laterality Date  . NO PAST SURGERIES       OB History    Gravida  2   Para  1   Term  1   Preterm  0   AB  1   Living  1     SAB  1   TAB  0   Ectopic  0   Multiple  0   Live Births  1           Family History  Problem Relation Age of Onset  . Diabetes Maternal Grandmother   . Anesthesia problems Neg Hx   . Hypotension Neg Hx   . Malignant hyperthermia Neg Hx   . Pseudochol deficiency Neg Hx   . Other Neg Hx     Social History   Tobacco Use  . Smoking status: Former Smoker    Packs/day: 1.00    Years: 10.00    Pack years: 10.00    Types: Cigarettes    Quit date: 10/04/2017    Years since quitting: 1.7  . Smokeless tobacco: Former Neurosurgeon    Quit date: 02/01/2012  Substance Use Topics  . Alcohol use: No    Comment: rarely  . Drug use: Not Currently      Types: Marijuana    Comment: no longer     Home Medications Prior to Admission medications   Medication Sig Start Date End Date Taking? Authorizing Provider  acetaminophen (TYLENOL) 500 MG tablet Take 1,000 mg by mouth every 6 (six) hours as needed for mild pain.    [provider]  ciprofloxacin (CIPRO) 500 MG tablet Take 1 tablet (500 mg total) by mouth 2 (two) times daily. 05/08/18   Azalia Bilis, MD  dolutegravir (TIVICAY) 50 MG tablet Take 1 tablet (50 mg total) by mouth daily. 05/06/19   Mardella Layman, MD  emtricitabine-tenofovir (TRUVADA) 200-300 MG tablet Take 1 tablet by mouth daily. 05/06/19   Bast, Gloris Manchester A, NP  loratadine-pseudoephedrine (CLARITIN-D 24 HOUR) 10-240 MG 24 hr tablet Take 1 tablet by mouth daily. 06/20/19   Polly Barner A, PA-C  methocarbamol (ROBAXIN) 500 MG tablet Take 1 tablet (500 mg total) by mouth 2 (two) times daily. 02/06/19   Gailen Shelter, PA  metroNIDAZOLE (FLAGYL) 500 MG tablet Take 1 tablet (500 mg  total) by mouth 3 (three) times daily. 05/08/18   Azalia Bilis, MD  ondansetron (ZOFRAN ODT) 8 MG disintegrating tablet Take 1 tablet (8 mg total) by mouth every 8 (eight) hours as needed for nausea or vomiting. 05/08/18   Azalia Bilis, MD  raNITIdine HCl (ACID REDUCER PO) Take 2 tablets by mouth daily as needed (acid reflux).    [provider]    Allergies    Patient has no known allergies.  Review of Systems   Review of Systems  Constitutional: Negative for activity change, chills, diaphoresis and fever.  HENT: Positive for congestion. Negative for facial swelling, sinus pressure, sinus pain and sore throat.   Eyes: Positive for photophobia, pain, discharge, redness and itching. Negative for visual disturbance.  Respiratory: Negative for shortness of breath.   Cardiovascular: Negative for chest pain.  Gastrointestinal: Negative for abdominal pain, diarrhea, nausea and vomiting.  Genitourinary: Negative for dysuria.   Musculoskeletal: Negative for back pain.  Skin: Negative for rash.  Allergic/Immunologic: Negative for immunocompromised state.  Neurological: Negative for dizziness, weakness, light-headedness and headaches.  Psychiatric/Behavioral: Negative for confusion.    Physical Exam Updated Vital Signs BP (!) 146/97   Pulse 69   Temp 97.9 F (36.6 C) (Oral)   Resp 18   Ht 5\' 6"  (1.676 m)   Wt 86.2 kg   SpO2 100%   BMI 30.67 kg/m   Physical Exam Vitals and nursing note reviewed.  Constitutional:      General: She is not in acute distress. HENT:     Head: Normocephalic.  Eyes:     General: Lids are normal. No scleral icterus.       Right eye: Discharge present. No foreign body or hordeolum.        Left eye: Discharge present.No foreign body or hordeolum.     Extraocular Movements: Extraocular movements intact.     Conjunctiva/sclera:     Right eye: Right conjunctiva is injected. No chemosis, exudate or hemorrhage.    Left eye: Left conjunctiva is injected. No chemosis, exudate or hemorrhage.    Pupils: Pupils are equal, round, and reactive to light.     Right eye: Corneal abrasion and fluorescein uptake present. Seidel exam negative.     Left eye: Corneal abrasion and fluorescein uptake present. Seidel exam negative.    Comments: Funduscopic exam on undilated eyes is unremarkable.  Able to finger count bilaterally.  Mild swelling of the bilateral upper eyelids.  No redness or warmth.  Cardiovascular:     Rate and Rhythm: Normal rate and regular rhythm.     Heart sounds: No murmur. No friction rub. No gallop.   Pulmonary:     Effort: Pulmonary effort is normal. No respiratory distress.  Abdominal:     General: There is no distension.     Palpations: Abdomen is soft.  Musculoskeletal:     Cervical back: Neck supple.  Skin:    General: Skin is warm.     Findings: No rash.  Neurological:     Mental Status: She is alert.  Psychiatric:        Behavior: Behavior normal.      ED Results / Procedures / Treatments   Labs (all labs ordered are listed, but only abnormal results are displayed) Labs Reviewed - No data to display  EKG None  Radiology No results found.  Procedures Procedures (including critical care time)  Medications Ordered in ED Medications  fluorescein ophthalmic strip 1 strip (1 strip Both Eyes Given  by Other 06/20/19 0130)  tetracaine (PONTOCAINE) 0.5 % ophthalmic solution 2 drop (2 drops Both Eyes Given by Other 06/20/19 0130)  ketorolac (ACULAR) 0.5 % ophthalmic solution 1 drop (1 drop Both Eyes Given 06/20/19 0159)  ofloxacin (OCUFLOX) 0.3 % ophthalmic solution 1 drop (1 drop Both Eyes Given 06/20/19 0159)  hydrOXYzine (ATARAX/VISTARIL) tablet 10 mg (10 mg Oral Given 06/20/19 0159)    ED Course  I have reviewed the triage vital signs and the nursing notes.  Pertinent labs & imaging results that were available during my care of the patient were reviewed by me and considered in my medical decision making (see chart for details).    MDM Rules/Calculators/A&P                      34 year old female with no significant past medical history presenting with bilateral eye redness, pain, and tearing that began today after removing a set of fashion contacts that she has been wearing and sleeping on for the last 2 days.  She has been having associated photophobia.  Reports that she has been rubbing her eyes constantly since taking out the contacts.  She denies fever, chills, headache, nausea, vomiting, diplopia, loss of vision, purulence discharge.  She does also note that she has been having nasal congestion for the last month.  She has been taking Claritin and Flonase without improvement in her symptoms.  No sinus pain or pressure.  She does not wear prescription contacts.  She has tried to treat her symptoms by placing a cool rag over her eyes and taking ibuprofen earlier today with no improvement.  On exam, she is mildly hypertensive, but vitals  are otherwise unremarkable.  On exam, bilateral eyes are injected and there is some mild swelling of the bilateral lids.  No periorbital redness or erythema.  I suspect this has been from rubbing her eyes for the last few hours.  Bilateral conjunctivae is injected.  She declined visual acuity due to discomfort from the light, but on my exam is able to finger count and has no gross visual deficits.  She has fluorescein uptake bilaterally with multiple tiny corneal abrasions.  I suspect this has been from the contact and rubbing her eyes for the last few hours.  We will treat with ketorolac drops and prophylactically start ofloxacin antibiotic drops.  Low suspicion for endophthalmitis, septal or preseptal cellulitis, hyphema, corneal ulcer, or HSV or VSV infection.  She does also expressed concerns that she has been having nasal congestion for the last month despite taking Claritin and Flonase.  Recommended Claritin-D.  All questions answered.  Patient is agreeable with plan at this time and is feeling much improved.  Will discharge the patient with ophthalmology referral.  Patient has been instructed to use cool compresses and practice personal hygiene with frequent hand washing.  Patient understands to follow up with ophthalmology, especially if new symptoms including change in vision, purulent drainage, or entrapment occur.    Final Clinical Impression(s) / ED Diagnoses Final diagnoses:  Bilateral corneal abrasions, initial encounter  Seasonal allergic rhinitis, unspecified trigger    Rx / DC Orders ED Discharge Orders         Ordered    loratadine-pseudoephedrine (CLARITIN-D 24 HOUR) 10-240 MG 24 hr tablet  Daily     06/20/19 0216           Everett Ricciardelli A, PA-C 06/20/19 Elby Showers, MD 06/20/19 [phone]

## 2019-06-20 NOTE — Discharge Instructions (Signed)
Thank you for allowing me to care for you today in the Emergency Department.   You can place 1 drop of ketorolac drops into each eye every 6 hours while you are awake.  This medication similar to ibuprofen.  Do not take other NSAIDs, including ibuprofen, while you are taking this medication.  Place 1 drop of ofloxacin drops in each eye every 2-4 hours while you are awake for the first 2 days then 1 to 2 drops every 6 hours while you are awake for the next 3 days.  You can take 650 mg of Tylenol once every 6 hours for pain.  You can also use cold washcloth in place them over the eyes to help with pain and itching.  Try not to rub your eyes as this will make your symptoms worse.  Regarding your nasal congestion, you can take Claritin-D as prescribed.  Continue to take your home Flonase as needed.  Do not take Claritin if you are taking Claritin-D.  If your eye pain does not completely resolve in the next 4 to 5 days, please follow-up with Dr. Alben Spittle with ophthalmology.  Return to the emergency department if you develop fevers, chills, significant redness or swelling around the eyes, particularly after you have been on antibiotics for the next 2 days, significant changes in your vision, or other new, concerning symptoms.

## 2019-06-20 NOTE — ED Notes (Signed)
This RN attempted to do a visual acuity screening with the patient. Patient declines screening at this time, stating that her eyes are in too much pain right now.

## 2019-07-30 ENCOUNTER — Emergency Department (HOSPITAL_COMMUNITY)
Admission: EM | Admit: 2019-07-30 | Discharge: 2019-07-30 | Disposition: A | Discharge status: Home / Self Care | Payer: Medicaid Other

## 2019-07-30 ENCOUNTER — Other Ambulatory Visit: Payer: Self-pay

## 2019-07-30 ENCOUNTER — Ambulatory Visit (HOSPITAL_COMMUNITY)
Admission: EM | Admit: 2019-07-30 | Discharge: 2019-07-30 | Disposition: A | Discharge status: Home / Self Care | Payer: Medicaid Other | Attending: Family Medicine | Admitting: Family Medicine

## 2019-07-30 DIAGNOSIS — Z20822 Contact with and (suspected) exposure to covid-19: Secondary | ICD-10-CM | POA: Diagnosis not present

## 2019-07-30 DIAGNOSIS — K219 Gastro-esophageal reflux disease without esophagitis: Secondary | ICD-10-CM | POA: Diagnosis not present

## 2019-07-30 DIAGNOSIS — Z79899 Other long term (current) drug therapy: Secondary | ICD-10-CM | POA: Insufficient documentation

## 2019-07-30 DIAGNOSIS — Z87891 Personal history of nicotine dependence: Secondary | ICD-10-CM | POA: Diagnosis not present

## 2019-07-30 DIAGNOSIS — J302 Other seasonal allergic rhinitis: Secondary | ICD-10-CM | POA: Diagnosis present

## 2019-07-30 LAB — SARS CORONAVIRUS 2 (TAT 6-24 HRS): SARS Coronavirus 2: NEGATIVE

## 2019-07-30 LAB — HIV ANTIBODY (ROUTINE TESTING W REFLEX): HIV Screen 4th Generation wRfx: NONREACTIVE

## 2019-07-30 MED ORDER — CLARITIN-D 24 HOUR 10-240 MG PO TB24: 1.0000 | ORAL_TABLET | Freq: Every day | ORAL | 0 refills | Status: DC

## 2019-07-30 NOTE — Discharge Instructions (Signed)
Take the claritin D daily This is for allergies The COVID and HIV test results will be available on My Chart You must quarantine until the COVID test is available

## 2019-07-30 NOTE — ED Provider Notes (Signed)
[facility]    CSN: [id number] Arrival date & time: 07/30/19  1520      History   Chief Complaint Chief Complaint  Patient presents with  . Nasal Congestion  . Cough    HPI Kristie Sandoval is a 34 y.o. female.   HPI  Patient has runny stuffy nose, postnasal drip, mild sore throat, and allergy symptoms since yesterday.  For a while yesterday she thought she lost her sense of smell, but it came back.  She was seen recently and given a prescription for Claritin-D.  She would like this refilled. She also would like HIV testing.  She is convinced that the tattoo she received several months ago may have been performed with dirty needles.  She was treated for HIV exposure.  She has no symptoms.  Past Medical History:  Diagnosis Date  . Chlamydia infection, current pregnancy 12/12   Test of cure negative in April  . GERD (gastroesophageal reflux disease)   . Gonorrhea, current pregnancy 12/12   Test of cure negative in April  . Headache(784.0)    no hx prior to preg  . Trichomonas     There are no problems to display for this patient.   Past Surgical History:  Procedure Laterality Date  . NO PAST SURGERIES      OB History    Gravida  2   Para  1   Term  1   Preterm  0   AB  1   Living  1     SAB  1   TAB  0   Ectopic  0   Multiple  0   Live Births  1            Home Medications    Prior to Admission medications   Medication Sig Start Date End Date Taking? Authorizing Provider  acetaminophen (TYLENOL) 500 MG tablet Take 1,000 mg by mouth every 6 (six) hours as needed for mild pain.    [provider]  loratadine-pseudoephedrine (CLARITIN-D 24 HOUR) 10-240 MG 24 hr tablet Take 1 tablet by mouth daily. 07/30/19   Eustace Moore, MD  raNITIdine HCl (ACID REDUCER PO) Take 2 tablets by mouth daily as needed (acid reflux).  07/30/19  [provider]    Family History Family History  Problem Relation Age of Onset  .  Diabetes Maternal Grandmother   . Anesthesia problems Neg Hx   . Hypotension Neg Hx   . Malignant hyperthermia Neg Hx   . Pseudochol deficiency Neg Hx   . Other Neg Hx     Social History Social History   Tobacco Use  . Smoking status: Former Smoker    Packs/day: 1.00    Years: 10.00    Pack years: 10.00    Types: Cigarettes    Quit date: 10/04/2017    Years since quitting: 1.8  . Smokeless tobacco: Former Neurosurgeon    Quit date: 02/01/2012  Substance Use Topics  . Alcohol use: No    Comment: rarely  . Drug use: Not Currently    Types: Marijuana    Comment: no longer      Allergies   Patient has no known allergies.   Review of Systems Review of Systems  Constitutional: Negative for chills, fatigue and fever.  HENT: Positive for congestion, postnasal drip, rhinorrhea and sore throat.   Eyes: Positive for itching.  Respiratory: Positive for cough. Negative for shortness of breath and wheezing.  Physical Exam Triage Vital Signs ED Triage Vitals  Enc Vitals Group     BP 07/30/19 1652 (!) 141/98     Pulse Rate 07/30/19 1652 86     Resp 07/30/19 1652 16     Temp 07/30/19 1652 98.6 F (37 C)     Temp Source 07/30/19 1652 Oral     SpO2 07/30/19 1652 100 %     Weight --      Height --      Head Circumference --      Peak Flow --      Pain Score 07/30/19 1651 0     Pain Loc --      Pain Edu? --      Excl. in GC? --    No data found.  Updated Vital Signs BP (!) 141/98 (BP Location: Right Arm)   Pulse 86   Temp 98.6 F (37 C) (Oral)   Resp 16   LMP 07/17/2019 (Exact Date)   SpO2 100%  r:     Physical Exam Constitutional:      General: She is not in acute distress.    Appearance: She is well-developed.  HENT:     Head: Normocephalic and atraumatic.     Right Ear: Tympanic membrane, ear canal and external ear normal.     Left Ear: Ear canal and external ear normal.     Nose: Congestion and rhinorrhea present.     Comments: Nasal membranes are pale  and swollen.  Clear rhinorrhea.    Mouth/Throat:     Pharynx: Posterior oropharyngeal erythema present.     Comments: Posterior pharynx with lymphoid hyperplasia.  Erythema. Eyes:     Conjunctiva/sclera: Conjunctivae normal.     Pupils: Pupils are equal, round, and reactive to light.  Cardiovascular:     Rate and Rhythm: Normal rate and regular rhythm.     Heart sounds: Normal heart sounds.  Pulmonary:     Effort: Pulmonary effort is normal. No respiratory distress.     Breath sounds: Normal breath sounds.  Musculoskeletal:        General: Normal range of motion.     Cervical back: Normal range of motion.  Lymphadenopathy:     Cervical: No cervical adenopathy.  Skin:    General: Skin is warm and dry.  Neurological:     Mental Status: She is alert.  Psychiatric:        Mood and Affect: Mood normal.        Behavior: Behavior normal.      UC Treatments / Results  Labs (all labs ordered are listed, but only abnormal results are displayed) Labs Reviewed  SARS CORONAVIRUS 2 (TAT 6-24 HRS)  HIV ANTIBODY (ROUTINE TESTING W REFLEX)    EKG   Radiology No results found.  Procedures Procedures (including critical care time)  Medications Ordered in UC Medications - No data to display  Initial Impression / Assessment and Plan / UC Course  I have reviewed the triage vital signs and the nursing notes.  Pertinent labs & imaging results that were available during my care of the patient were reviewed by me and considered in my medical decision making (see chart for details).     Covid testing is performed Allergies treated with Clariten D HIV testing performed at patient request Final Clinical Impressions(s) / UC Diagnoses   Final diagnoses:  Seasonal allergies     Discharge Instructions     Take the claritin D daily This is  for allergies The COVID and HIV test results will be available on My Chart You must quarantine until the COVID test is available    ED  Prescriptions    Medication Sig Dispense Auth. Provider   loratadine-pseudoephedrine (CLARITIN-D 24 HOUR) 10-240 MG 24 hr tablet Take 1 tablet by mouth daily. 30 tablet Raylene Everts, MD     PDMP not reviewed this encounter.   Raylene Everts, MD 07/30/19 [phone]

## 2019-07-30 NOTE — ED Triage Notes (Signed)
Patient reports she has nasal congestion and cough. Reports she lost her taste yesterday, but it came back. Is also requesting HIV testing.

## 2019-11-27 ENCOUNTER — Encounter (HOSPITAL_COMMUNITY): Payer: Self-pay

## 2019-11-27 ENCOUNTER — Other Ambulatory Visit: Payer: Self-pay

## 2019-11-27 ENCOUNTER — Ambulatory Visit (HOSPITAL_COMMUNITY)
Admission: EM | Admit: 2019-11-27 | Discharge: 2019-11-27 | Disposition: A | Discharge status: Home / Self Care | Payer: Medicaid Other | Attending: Family Medicine | Admitting: Family Medicine

## 2019-11-27 DIAGNOSIS — N760 Acute vaginitis: Secondary | ICD-10-CM | POA: Diagnosis not present

## 2019-11-27 MED ORDER — NYSTATIN 100000 UNIT/GM EX CREA: 1.0000 "application " | TOPICAL_CREAM | Freq: Two times a day (BID) | CUTANEOUS | 0 refills | Status: DC | PRN

## 2019-11-27 NOTE — ED Provider Notes (Signed)
[facility]    CSN: [id number] Arrival date & time: 11/27/19  1719      History   Chief Complaint Chief Complaint  Patient presents with  . Vaginitis    HPI Kristie Sandoval is a 34 y.o. female.   Here today for intermittent vaginal and groin fold itching, discharge off and on. Notes the issue seems worse after using bath and body works products for bathing. Denies known exposure to STIs though does want to be tested for gonorrhea and chlamydia to be safe. Denies fever, chills, pelvic pain, dysuria, hematuria, rashes. Not trying anything OTC for sxs.      Past Medical History:  Diagnosis Date  . Chlamydia infection, current pregnancy 12/12   Test of cure negative in April  . GERD (gastroesophageal reflux disease)   . Gonorrhea, current pregnancy 12/12   Test of cure negative in April  . Headache(784.0)    no hx prior to preg  . Trichomonas     There are no problems to display for this patient.   Past Surgical History:  Procedure Laterality Date  . NO PAST SURGERIES      OB History    Gravida  2   Para  1   Term  1   Preterm  0   AB  1   Living  1     SAB  1   TAB  0   Ectopic  0   Multiple  0   Live Births  1            Home Medications    Prior to Admission medications   Medication Sig Start Date End Date Taking? Authorizing Provider  acetaminophen (TYLENOL) 500 MG tablet Take 1,000 mg by mouth every 6 (six) hours as needed for mild pain.    [provider]  loratadine-pseudoephedrine (CLARITIN-D 24 HOUR) 10-240 MG 24 hr tablet Take 1 tablet by mouth daily. 07/30/19   Eustace Moore, MD  nystatin cream (MYCOSTATIN) Apply 1 application topically 2 (two) times daily as needed for dry skin. 11/27/19   Particia Nearing, PA-C  raNITIdine HCl (ACID REDUCER PO) Take 2 tablets by mouth daily as needed (acid reflux).  07/30/19  [provider]    Family History Family History  Problem Relation Age of Onset   . Diabetes Maternal Grandmother   . Anesthesia problems Neg Hx   . Hypotension Neg Hx   . Malignant hyperthermia Neg Hx   . Pseudochol deficiency Neg Hx   . Other Neg Hx     Social History Social History   Tobacco Use  . Smoking status: Former Smoker    Packs/day: 1.00    Years: 10.00    Pack years: 10.00    Types: Cigarettes    Quit date: 10/04/2017    Years since quitting: 2.1  . Smokeless tobacco: Former Neurosurgeon    Quit date: 02/01/2012  Vaping Use  . Vaping Use: Never used  Substance Use Topics  . Alcohol use: No    Comment: rarely  . Drug use: Not Currently    Types: Marijuana    Comment: no longer      Allergies   Patient has no known allergies.   Review of Systems Review of Systems PER HPI   Physical Exam Triage Vital Signs ED Triage Vitals [11/27/19 1851]  Enc Vitals Group     BP (!) 118/91     Pulse Rate 88  Resp 18     Temp 98 F (36.7 C)     Temp Source Oral     SpO2 100 %     Weight      Height      Head Circumference      Peak Flow      Pain Score 0     Pain Loc      Pain Edu?      Excl. in GC?    No data found.  Updated Vital Signs BP (!) 118/91 (BP Location: Right Arm)   Pulse 88   Temp 98 F (36.7 C) (Oral)   Resp 18   LMP 11/12/2019   SpO2 100%   Visual Acuity Right Eye Distance:   Left Eye Distance:   Bilateral Distance:    Right Eye Near:   Left Eye Near:    Bilateral Near:     Physical Exam Vitals and nursing note reviewed. Exam conducted with a chaperone present.  Constitutional:      Appearance: Normal appearance. She is not ill-appearing.  HENT:     Head: Atraumatic.  Eyes:     Extraocular Movements: Extraocular movements intact.     Conjunctiva/sclera: Conjunctivae normal.  Cardiovascular:     Rate and Rhythm: Normal rate and regular rhythm.     Heart sounds: Normal heart sounds.  Pulmonary:     Effort: Pulmonary effort is normal.     Breath sounds: Normal breath sounds.  Abdominal:     General:  Bowel sounds are normal. There is no distension.     Palpations: Abdomen is soft.     Tenderness: There is no abdominal tenderness.  Genitourinary:    Comments: Hyperpigmentation present b/l groin folds, no lesions appreciable Musculoskeletal:        General: Normal range of motion.     Cervical back: Normal range of motion and neck supple.  Skin:    General: Skin is warm and dry.  Neurological:     Mental Status: She is alert and oriented to person, place, and time.  Psychiatric:        Mood and Affect: Mood normal.        Thought Content: Thought content normal.        Judgment: Judgment normal.      UC Treatments / Results  Labs (all labs ordered are listed, but only abnormal results are displayed) Labs Reviewed  CERVICOVAGINAL ANCILLARY ONLY    EKG   Radiology No results found.  Procedures Procedures (including critical care time)  Medications Ordered in UC Medications - No data to display  Initial Impression / Assessment and Plan / UC Course  I have reviewed the triage vital signs and the nursing notes.  Pertinent labs & imaging results that were available during my care of the patient were reviewed by me and considered in my medical decision making (see chart for details).     Await aptima swab results, discussed keeping areas clean and dry with powders, unscented deodorant spray. Use unscented soaps and shower products. Can use nystatin cream prn for itching in these areas.   Final Clinical Impressions(s) / UC Diagnoses   Final diagnoses:  Acute vaginitis   Discharge Instructions   None    ED Prescriptions    Medication Sig Dispense Auth. Provider   nystatin cream (MYCOSTATIN) Apply 1 application topically 2 (two) times daily as needed for dry skin. 90 g Particia Nearing, [place]     PDMP not reviewed  this encounter.   Particia Nearing, [place] 11/27/19 1935

## 2019-11-27 NOTE — ED Triage Notes (Signed)
Pt presents with vaginal itching X 2 days.

## 2019-11-28 LAB — CERVICOVAGINAL ANCILLARY ONLY
Bacterial Vaginitis (gardnerella): POSITIVE — AB
Candida Glabrata: NEGATIVE
Candida Vaginitis: NEGATIVE
Chlamydia: NEGATIVE
Comment: NEGATIVE
Comment: NEGATIVE
Comment: NEGATIVE
Comment: NEGATIVE
Comment: NEGATIVE
Comment: NORMAL
Neisseria Gonorrhea: NEGATIVE
Trichomonas: NEGATIVE

## 2019-12-02 ENCOUNTER — Telehealth (HOSPITAL_COMMUNITY): Payer: Self-pay | Admitting: Emergency Medicine

## 2019-12-02 MED ORDER — METRONIDAZOLE 500 MG PO TABS: 500.0000 mg | ORAL_TABLET | Freq: Two times a day (BID) | ORAL | 0 refills | Status: DC

## 2020-08-08 ENCOUNTER — Encounter (HOSPITAL_BASED_OUTPATIENT_CLINIC_OR_DEPARTMENT_OTHER): Payer: Self-pay | Admitting: Emergency Medicine

## 2020-08-08 ENCOUNTER — Emergency Department (HOSPITAL_BASED_OUTPATIENT_CLINIC_OR_DEPARTMENT_OTHER)
Admission: EM | Admit: 2020-08-08 | Discharge: 2020-08-08 | Disposition: A | Discharge status: Home / Self Care | Payer: Medicaid Other | Attending: Emergency Medicine | Admitting: Emergency Medicine

## 2020-08-08 ENCOUNTER — Other Ambulatory Visit: Payer: Self-pay

## 2020-08-08 DIAGNOSIS — S0502XA Injury of conjunctiva and corneal abrasion without foreign body, left eye, initial encounter: Secondary | ICD-10-CM | POA: Diagnosis not present

## 2020-08-08 DIAGNOSIS — S0592XA Unspecified injury of left eye and orbit, initial encounter: Secondary | ICD-10-CM | POA: Diagnosis present

## 2020-08-08 DIAGNOSIS — Z87891 Personal history of nicotine dependence: Secondary | ICD-10-CM | POA: Insufficient documentation

## 2020-08-08 DIAGNOSIS — W60XXXA Contact with nonvenomous plant thorns and spines and sharp leaves, initial encounter: Secondary | ICD-10-CM | POA: Diagnosis not present

## 2020-08-08 MED ORDER — ONDANSETRON 4 MG PO TBDP: 4.0000 mg | ORAL_TABLET | Freq: Three times a day (TID) | ORAL | 0 refills | Status: DC | PRN

## 2020-08-08 MED ORDER — ERYTHROMYCIN 5 MG/GM OP OINT: TOPICAL_OINTMENT | OPHTHALMIC | 0 refills | Status: DC

## 2020-08-08 MED ORDER — TETRACAINE HCL 0.5 % OP SOLN
2.0000 [drp] | Freq: Once | OPHTHALMIC | Status: AC
  Administered 2020-08-08: 2 [drp] via OPHTHALMIC
  Filled 2020-08-08: qty 4

## 2020-08-08 MED ORDER — FLUORESCEIN SODIUM 1 MG OP STRP
1.0000 | ORAL_STRIP | Freq: Once | OPHTHALMIC | Status: AC
  Administered 2020-08-08: 1 via OPHTHALMIC
  Filled 2020-08-08: qty 1

## 2020-08-08 NOTE — Discharge Instructions (Addendum)
If you develop fever, new or worsening eye pain, drainage from your eye, severe headaches, or any other new/concerning symptoms then return to the ER for evaluation.  Your blood pressure is elevated today.  It is very important to follow-up with a primary care physician not only for this but to establish primary care and for outpatient preventative testing.

## 2020-08-08 NOTE — ED Provider Notes (Addendum)
[facility] EMERGENCY DEPT Provider Note   CSN: [id number] Arrival date & time: 08/08/20  1320     History Chief Complaint  Patient presents with  . Eye Pain    Kristie Sandoval is a 35 y.o. female.  HPI 35 year old female presents with left eye injury.  She was picking up sticks that her nephews had put in her yard and when she picked up 1 it scratched her in the left eye.  Denies any retained foreign body.  Has had pain since last night but it did not go away so she came in today for evaluation.  Has a hard time even opening her left eye.  Otherwise she is asking for a Zofran prescription for chronic nausea for many months.  She states she does not want a type of work-up including a pregnancy test but knows that Zofran does tend to help.  Past Medical History:  Diagnosis Date  . Chlamydia infection, current pregnancy 12/12   Test of cure negative in April  . GERD (gastroesophageal reflux disease)   . Gonorrhea, current pregnancy 12/12   Test of cure negative in April  . Headache(784.0)    no hx prior to preg  . Trichomonas     There are no problems to display for this patient.   Past Surgical History:  Procedure Laterality Date  . NO PAST SURGERIES       OB History    Gravida  2   Para  1   Term  1   Preterm  0   AB  1   Living  1     SAB  1   IAB  0   Ectopic  0   Multiple  0   Live Births  1           Family History  Problem Relation Age of Onset  . Diabetes Maternal Grandmother   . Anesthesia problems Neg Hx   . Hypotension Neg Hx   . Malignant hyperthermia Neg Hx   . Pseudochol deficiency Neg Hx   . Other Neg Hx     Social History   Tobacco Use  . Smoking status: Former Smoker    Packs/day: 1.00    Years: 10.00    Pack years: 10.00    Types: Cigarettes    Quit date: 10/04/2017    Years since quitting: 2.8  . Smokeless tobacco: Former Neurosurgeon    Quit date: 02/01/2012  Vaping Use  . Vaping Use: Never used   Substance Use Topics  . Alcohol use: No    Comment: rarely  . Drug use: Not Currently    Types: Marijuana    Comment: no longer     Home Medications Prior to Admission medications   Medication Sig Start Date End Date Taking? Authorizing Provider  erythromycin ophthalmic ointment Place a 1/2 inch ribbon of ointment into the lower eyelid 4 times per day for 3-5 days (depending on your symptoms) 08/08/20  Yes Pricilla Loveless, MD  ondansetron (ZOFRAN ODT) 4 MG disintegrating tablet Take 1 tablet (4 mg total) by mouth every 8 (eight) hours as needed for nausea or vomiting. 08/08/20  Yes Pricilla Loveless, MD  acetaminophen (TYLENOL) 500 MG tablet Take 1,000 mg by mouth every 6 (six) hours as needed for mild pain.    [provider]  loratadine-pseudoephedrine (CLARITIN-D 24 HOUR) 10-240 MG 24 hr tablet Take 1 tablet by mouth daily. 07/30/19   Eustace Moore, MD  metroNIDAZOLE (FLAGYL)  500 MG tablet Take 1 tablet (500 mg total) by mouth 2 (two) times daily. 12/02/19   Lamptey, Britta Mccreedy, MD  nystatin cream (MYCOSTATIN) Apply 1 application topically 2 (two) times daily as needed for dry skin. 11/27/19   Particia Nearing, PA-C  raNITIdine HCl (ACID REDUCER PO) Take 2 tablets by mouth daily as needed (acid reflux).  07/30/19  [provider]    Allergies    Patient has no known allergies.  Review of Systems   Review of Systems  Eyes: Positive for photophobia, pain, redness and visual disturbance.    Physical Exam Updated Vital Signs BP (!) 147/72   Pulse 93   Resp 20   SpO2 100%   Physical Exam Vitals and nursing note reviewed.  Constitutional:      Appearance: She is well-developed.  HENT:     Head: Normocephalic and atraumatic.     Comments: No tenderness to periorbital face    Right Ear: External ear normal.     Left Ear: External ear normal.     Nose: Nose normal.  Eyes:     General:        Right eye: No discharge.        Left eye: No discharge.      Extraocular Movements: Extraocular movements intact.     Pupils: Pupils are equal, round, and reactive to light.     Comments: Photophobia Left eye is injected.  No focal tenderness. On fluorescein testing there is a small corneal abrasion a little lateral to the pupil.  No obvious active drainage to suggest laceration  Pulmonary:     Effort: Pulmonary effort is normal.  Abdominal:     General: There is no distension.  Skin:    General: Skin is warm and dry.  Neurological:     Mental Status: She is alert.  Psychiatric:        Mood and Affect: Mood is not anxious.     ED Results / Procedures / Treatments   Labs (all labs ordered are listed, but only abnormal results are displayed) Labs Reviewed - No data to display  EKG None  Radiology No results found.  Procedures Procedures   Medications Ordered in ED Medications  fluorescein ophthalmic strip 1 strip (1 strip Right Eye Given 08/08/20 1501)  tetracaine (PONTOCAINE) 0.5 % ophthalmic solution 2 drop (2 drops Right Eye Given 08/08/20 1500)    ED Course  I have reviewed the triage vital signs and the nursing notes.  Pertinent labs & imaging results that were available during my care of the patient were reviewed by me and considered in my medical decision making (see chart for details).    MDM Rules/Calculators/A&P                          Patient's pain immediately relieved with tetracaine.  She does not wear contacts.  She is found to have a corneal abrasion and I have given her erythromycin ointment and recommended Tylenol and ibuprofen for pain.  Follow-up with ophthalmology if not better.  Mildly decreased vision in the left eye as expected given the corneal abrasion.  Otherwise, this should heal with time.  She also notes chronic headaches and chronic nausea though she states this seems separate and is wanting treatments for these.  However she also does not want work-up.  I will give her Zofran Rx but discussed that  she needs to follow-up with a PCP  for further outpatient work-ups. Final Clinical Impression(s) / ED Diagnoses Final diagnoses:  Abrasion of left cornea, initial encounter    Rx / DC Orders ED Discharge Orders         Ordered    erythromycin ophthalmic ointment        08/08/20 1508    ondansetron (ZOFRAN ODT) 4 MG disintegrating tablet  Every 8 hours PRN        08/08/20 1508           Pricilla Loveless, MD 08/08/20 1512    Pricilla Loveless, MD 08/08/20 1513

## 2020-08-08 NOTE — ED Triage Notes (Signed)
Pt scratched her eye with branch of a tree.

## 2020-08-08 NOTE — ED Notes (Signed)
ED Provider at bedside. 

## 2020-08-08 NOTE — ED Notes (Signed)
Left eye visual acuity20/50 Right eye visual acuity 20/30 Acuity for both eyes open 20/30

## 2021-04-12 ENCOUNTER — Encounter (HOSPITAL_COMMUNITY): Payer: Self-pay | Admitting: *Deleted

## 2021-04-12 ENCOUNTER — Inpatient Hospital Stay (HOSPITAL_COMMUNITY)
Admission: AD | Admit: 2021-04-12 | Discharge: 2021-04-12 | Disposition: A | Discharge status: Home / Self Care | Payer: Medicaid Other | Attending: Family Medicine | Admitting: Family Medicine

## 2021-04-12 DIAGNOSIS — Z3200 Encounter for pregnancy test, result unknown: Secondary | ICD-10-CM

## 2021-04-12 NOTE — MAU Provider Note (Signed)
None     S Ms. Kristie Sandoval is a 36 y.o. G3P1011 patient who presents to MAU today requesting an ultrasound to "determine the gender". Patient reports a +upt at home "sometime in December", but is unsure of LMP. She denies abdominal pain, vaginal bleeding, urinary s/s, or any other concerns.     O BP 113/69 (BP Location: Right Arm)    Pulse 82    Temp 98.9 F (37.2 C) (Oral)    Resp 16    Ht 5\' 4"  (1.626 m)    Wt 72.2 kg    LMP  (LMP Unknown)    SpO2 100%    BMI 27.31 kg/m  Physical Exam Vitals and nursing note reviewed.  Constitutional:      General: She is not in acute distress. Eyes:     Pupils: Pupils are equal, round, and reactive to light.  Cardiovascular:     Rate and Rhythm: Normal rate.  Pulmonary:     Effort: Pulmonary effort is normal.  Abdominal:     Palpations: Abdomen is soft.     Tenderness: There is no abdominal tenderness.  Musculoskeletal:        General: Normal range of motion.     Cervical back: Normal range of motion.  Skin:    General: Skin is warm and dry.  Neurological:     General: No focal deficit present.     Mental Status: She is alert and oriented to person, place, and time.   FHT: 150 bpm via doppler  A Medical screening exam complete Pregnancy confirmation  P Discharge from MAU in stable condition Patient to call [facility] to establish prenatal care Will order outpatient ultrasound to be scheduled Warning signs for worsening condition that would warrant emergency follow-up discussed Patient may return to MAU as needed    , CNM 04/12/2021 1:32 PM

## 2021-04-12 NOTE — MAU Note (Signed)
Is pregnant.  Has not been seen anywhere or had the pregnancy confirmed.  +HPT in Dec, doesn't remember if it was before or after Christmas. Denies pain or bleeding. No problems.

## 2021-04-19 ENCOUNTER — Other Ambulatory Visit: Payer: Self-pay

## 2021-04-19 DIAGNOSIS — Z362 Encounter for other antenatal screening follow-up: Secondary | ICD-10-CM

## 2021-04-20 ENCOUNTER — Inpatient Hospital Stay: Admission: RE | Admit: 2021-04-20 | Payer: Medicaid Other | Source: Ambulatory Visit

## 2021-04-20 ENCOUNTER — Ambulatory Visit: Payer: Medicaid Other

## 2021-04-20 ENCOUNTER — Other Ambulatory Visit: Payer: Self-pay

## 2021-04-20 ENCOUNTER — Ambulatory Visit: Payer: Medicaid Other | Attending: Obstetrics

## 2021-04-20 ENCOUNTER — Other Ambulatory Visit: Payer: Medicaid Other

## 2021-04-20 DIAGNOSIS — Z362 Encounter for other antenatal screening follow-up: Secondary | ICD-10-CM | POA: Insufficient documentation

## 2021-04-21 ENCOUNTER — Other Ambulatory Visit: Payer: Self-pay | Admitting: *Deleted

## 2021-04-21 DIAGNOSIS — O0932 Supervision of pregnancy with insufficient antenatal care, second trimester: Secondary | ICD-10-CM

## 2021-05-03 ENCOUNTER — Telehealth: Payer: Self-pay | Admitting: Genetics

## 2021-05-03 NOTE — Telephone Encounter (Addendum)
Kristie Sandoval was contacted by telephone to review their noninvasive prenatal screening (NIPS) result. The result is low risk. This screening significantly reduces the risk that the current pregnancy has Down syndrome, Trisomy 14, Trisomy 13, Monosomy X, and Triploidy.We discussed that this is a screening and not a diagnostic test. All questions answered.

## 2021-05-06 ENCOUNTER — Other Ambulatory Visit: Payer: Self-pay

## 2021-05-19 ENCOUNTER — Ambulatory Visit: Payer: Medicaid Other

## 2021-05-26 ENCOUNTER — Other Ambulatory Visit: Payer: Self-pay

## 2021-05-26 ENCOUNTER — Ambulatory Visit: Payer: Medicaid Other | Attending: Obstetrics

## 2021-05-26 ENCOUNTER — Ambulatory Visit: Payer: Medicaid Other

## 2021-08-12 ENCOUNTER — Ambulatory Visit (INDEPENDENT_AMBULATORY_CARE_PROVIDER_SITE_OTHER): Payer: 59 | Admitting: Family Medicine

## 2021-08-12 ENCOUNTER — Other Ambulatory Visit (HOSPITAL_COMMUNITY)
Admission: RE | Admit: 2021-08-12 | Discharge: 2021-08-12 | Disposition: A | Discharge status: Home / Self Care | Payer: Medicaid Other | Source: Ambulatory Visit | Attending: Family Medicine | Admitting: Family Medicine

## 2021-08-12 ENCOUNTER — Encounter: Payer: Self-pay | Admitting: Family Medicine

## 2021-08-12 VITALS — BP 124/86 | HR 108 | Wt 166.1 lb

## 2021-08-12 DIAGNOSIS — O0990 Supervision of high risk pregnancy, unspecified, unspecified trimester: Secondary | ICD-10-CM | POA: Insufficient documentation

## 2021-08-12 DIAGNOSIS — Z124 Encounter for screening for malignant neoplasm of cervix: Secondary | ICD-10-CM | POA: Diagnosis present

## 2021-08-12 DIAGNOSIS — Z3493 Encounter for supervision of normal pregnancy, unspecified, third trimester: Secondary | ICD-10-CM

## 2021-08-12 DIAGNOSIS — Z23 Encounter for immunization: Secondary | ICD-10-CM | POA: Diagnosis not present

## 2021-08-13 LAB — CBC/D/PLT+RPR+RH+ABO+RUBIGG...
Antibody Screen: NEGATIVE
Basophils Absolute: 0 10*3/uL (ref 0.0–0.2)
Basos: 0 %
EOS (ABSOLUTE): 0.2 10*3/uL (ref 0.0–0.4)
Eos: 2 %
HCV Ab: NONREACTIVE
HIV Screen 4th Generation wRfx: NONREACTIVE
Hematocrit: 33.2 % — ABNORMAL LOW (ref 34.0–46.6)
Hemoglobin: 11.5 g/dL (ref 11.1–15.9)
Hepatitis B Surface Ag: NEGATIVE
Immature Grans (Abs): 0 10*3/uL (ref 0.0–0.1)
Immature Granulocytes: 0 %
Lymphocytes Absolute: 2.1 10*3/uL (ref 0.7–3.1)
Lymphs: 30 %
MCH: 32.2 pg (ref 26.6–33.0)
MCHC: 34.6 g/dL (ref 31.5–35.7)
MCV: 93 fL (ref 79–97)
Monocytes Absolute: 0.6 10*3/uL (ref 0.1–0.9)
Monocytes: 8 %
Neutrophils Absolute: 4.1 10*3/uL (ref 1.4–7.0)
Neutrophils: 60 %
Platelets: 278 10*3/uL (ref 150–450)
RBC: 3.57 x10E6/uL — ABNORMAL LOW (ref 3.77–5.28)
RDW: 12.6 % (ref 11.7–15.4)
RPR Ser Ql: NONREACTIVE
Rh Factor: POSITIVE
Rubella Antibodies, IGG: 4.72 index (ref >0.99)
WBC: 7 10*3/uL (ref 3.4–10.8)

## 2021-08-13 LAB — GC/CHLAMYDIA PROBE AMP (~~LOC~~) NOT AT ARMC
Chlamydia: NEGATIVE
Comment: NEGATIVE
Comment: NORMAL
Neisseria Gonorrhea: NEGATIVE

## 2021-08-13 LAB — HEMOGLOBIN A1C
Est. average glucose Bld gHb Est-mCnc: 117 mg/dL
Hgb A1c MFr Bld: 5.7 % — ABNORMAL HIGH (ref 4.8–5.6)

## 2021-08-13 LAB — HCV INTERPRETATION

## 2021-08-13 NOTE — Progress Notes (Signed)
Subjective:   Kristie Sandoval is a 36 y.o. G3P1011 at [redacted]w[redacted]d by midtrimester ultrasound being seen today for her first obstetrical visit.  Her obstetrical history is not significant. Pregnancy history fully reviewed.  Patient reports no complaints.  HISTORY: OB History  Gravida Para Term Preterm AB Living  3 1 1  0 1 1  SAB IAB Ectopic Multiple Live Births  1 0 0 0 1    # Outcome Date GA Lbr Len/2nd Weight Sex Delivery Anes PTL Lv  3 Current           2 Term 10/26/11 [redacted]w[redacted]d 15:36 / 00:42 6 lb 5 oz (2.863 kg) F Vag-Spont EPI  LIV     Birth Comments: WNL      Name: Vendetti,GIRL Tyne     Apgar1: 8  Apgar5: 9  1 SAB            Past Medical History:  Diagnosis Date   Chlamydia infection, current pregnancy 12/12   Test of cure negative in April   GERD (gastroesophageal reflux disease)    Gonorrhea, current pregnancy 12/12   Test of cure negative in April   Headache(784.0)    no hx prior to preg   Trichomonas    Past Surgical History:  Procedure Laterality Date   NO PAST SURGERIES     Family History  Problem Relation Age of Onset   Diabetes Mother    Diabetes Maternal Grandmother    Anesthesia problems Neg Hx    Hypotension Neg Hx    Malignant hyperthermia Neg Hx    Pseudochol deficiency Neg Hx    Other Neg Hx    Social History   Tobacco Use   Smoking status: Former    Packs/day: 1.00    Years: 10.00    Pack years: 10.00    Types: Cigarettes    Quit date: 10/04/2017    Years since quitting: 3.8   Smokeless tobacco: Former    Quit date: 02/01/2012  Vaping Use   Vaping Use: Never used  Substance Use Topics   Alcohol use: No    Comment: rarely   Drug use: Not Currently    Types: Marijuana    Comment: no longer    No Known Allergies Current Outpatient Medications on File Prior to Visit  Medication Sig Dispense Refill   [DISCONTINUED] raNITIdine HCl (ACID REDUCER PO) Take 2 tablets by mouth daily as needed (acid reflux).     No current  facility-administered medications on file prior to visit.     Exam   Vitals:   08/12/21 1611  BP: 124/86  Pulse: (!) 108  Weight: 166 lb 1.6 oz (75.3 kg)      Uterus:     Pelvic Exam: Perineum: no hemorrhoids, normal perineum   Vulva: normal external genitalia, no lesions   Vagina:  normal mucosa, normal discharge   Cervix: no lesions and normal, pap smear done.    Adnexa: normal adnexa and no mass, fullness, tenderness   Bony Pelvis: average  System: General: well-developed, well-nourished female in no acute distress   Breast:  normal appearance, no masses or tenderness   Skin: normal coloration and turgor, no rashes   Neurologic: oriented, normal, negative, normal mood   Extremities: normal strength, tone, and muscle mass, ROM of all joints is normal   HEENT PERRLA, extraocular movement intact and sclera clear, anicteric   Mouth/Teeth mucous membranes moist, pharynx normal without lesions and dental hygiene good   Neck  supple and no masses   Cardiovascular: regular rate and rhythm   Respiratory:  no respiratory distress, normal breath sounds   Abdomen: soft, non-tender; bowel sounds normal; no masses,  no organomegaly     Assessment:   Pregnancy: G3P1011 Patient Active Problem List   Diagnosis Date Noted   Supervision of high risk pregnancy, antepartum 08/12/2021   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 Encounter for supervision of low-risk pregnancy in third trimester Begin prenatal care - Culture, OB Urine - GC/Chlamydia probe amp ([place])not at [facility] - Hemoglobin A1c - arrived too late for 1 hour today - CBC/D/Plt+RPR+Rh+ABO+RubIgG... - Tdap vaccine greater than or equal to 7yo IM  2. Screening for cervical cancer - Cytology - PAP( [place])    Initial labs drawn. Continue prenatal vitamins. Genetic Screening discussed, NIPS: results reviewed. Ultrasound discussed; fetal anatomic survey: results  reviewed. Problem list reviewed and updated. The nature of [facility] with multiple MDs and other Advanced Practice Providers was explained to patient; also emphasized that residents, students are part of our team. Routine obstetric precautions reviewed. Return in 2 weeks (on 08/26/2021).

## 2021-08-16 LAB — URINE CULTURE, OB REFLEX

## 2021-08-16 LAB — CULTURE, OB URINE

## 2021-08-17 ENCOUNTER — Telehealth: Payer: Self-pay

## 2021-08-17 LAB — CYTOLOGY - PAP
Comment: NEGATIVE
Diagnosis: NEGATIVE
High risk HPV: NEGATIVE

## 2021-08-17 NOTE — Telephone Encounter (Addendum)
-----   Message from Donnamae Jude, MD sent at 08/13/2021  2:02 PM EDT ----- A1c is high--needs 2 hour  Call cannot be completed. MyChart message sent.

## 2021-08-18 ENCOUNTER — Other Ambulatory Visit: Payer: Self-pay

## 2021-08-18 ENCOUNTER — Other Ambulatory Visit: Payer: Medicaid Other

## 2021-08-18 DIAGNOSIS — O0990 Supervision of high risk pregnancy, unspecified, unspecified trimester: Secondary | ICD-10-CM

## 2021-08-18 NOTE — Telephone Encounter (Signed)
Called patient to inform of elevated A1C and need for early 2 hour GTT. Patient did not answer and called her mother who was able to connect me to patient. Patient agreed, we could speak in front of her mom.   Patient has appt Friday morning for 2 hour GTT and is aware to be fasting after midnight.   Patient with no questions or concerns at this time.

## 2021-08-20 ENCOUNTER — Other Ambulatory Visit: Payer: Medicaid Other

## 2021-08-24 ENCOUNTER — Other Ambulatory Visit: Payer: Medicaid Other

## 2021-08-26 ENCOUNTER — Ambulatory Visit (INDEPENDENT_AMBULATORY_CARE_PROVIDER_SITE_OTHER): Payer: 59 | Admitting: Student

## 2021-08-26 VITALS — BP 120/81 | HR 102 | Wt 170.0 lb

## 2021-08-26 DIAGNOSIS — O0990 Supervision of high risk pregnancy, unspecified, unspecified trimester: Secondary | ICD-10-CM

## 2021-08-26 DIAGNOSIS — Z3A36 36 weeks gestation of pregnancy: Secondary | ICD-10-CM | POA: Diagnosis not present

## 2021-08-26 NOTE — Progress Notes (Signed)
   PRENATAL VISIT NOTE  Subjective:  Kristie Sandoval is a 36 y.o. G3P1011 at [redacted]w[redacted]d being seen today for ongoing prenatal care.  She is currently monitored for the following issues for this low-risk pregnancy and has Laryngopharyngeal reflux (LPR); Lymphadenopathy; and Supervision of high risk pregnancy, antepartum on their problem list.  Patient reports no complaints.  Contractions: Irritability. Vag. Bleeding: None.  Movement: Present. Denies leaking of fluid.   The following portions of the patient's history were reviewed and updated as appropriate: allergies, current medications, past family history, past medical history, past social history, past surgical history and problem list.   Objective:   Vitals:   08/26/21 1645  BP: 120/81  Pulse: (!) 102  Weight: 170 lb (77.1 kg)    Fetal Status: Fetal Heart Rate (bpm): 140   Movement: Present     General:  Alert, oriented and cooperative. Patient is in no acute distress.  Skin: Skin is warm and dry. No rash noted.   Cardiovascular: Normal heart rate noted  Respiratory: Normal respiratory effort, no problems with respiration noted  Abdomen: Soft, gravid, appropriate for gestational age.  Pain/Pressure: Present     Pelvic: Cervical exam deferred        Extremities: Normal range of motion.  Edema: None  Mental Status: Normal mood and affect. Normal behavior. Normal judgment and thought content.   Assessment and Plan:  Pregnancy: G3P1011 at [redacted]w[redacted]d 1. Supervision of high risk pregnancy, antepartum -vertex by BUS; cervical exam not done - Culture, beta strep (group b only) -patient FH is appropriate, however, will add follow up for growth as patient has not had any care and growth [place] is supposed to have been done earlier. CMA will schedule for her (now past 5 pm) -patient would like to do 1 hour NON fasting GTT next visit -patient given morning appointment and she agrees to come in person but she states that she "will be late" - Preterm  labor symptoms and general obstetric precautions including but not limited to vaginal bleeding, contractions, leaking of fluid and fetal movement were reviewed in detail with the patient. Please refer to After Visit Summary for other counseling recommendations.   Return please add her to lab schedule 1 hour GTT.  Future Appointments  Date Time Provider Department Center  09/01/2021  8:55 AM Warden Fillers, MD [facility]  09/01/2021  9:30 AM [facility]  09/08/2021  1:35 PM Osborne Oman [facility]  09/17/2021  9:35 AM Anyanwu, Jethro Bastos, MD [facility]  09/23/2021  3:35 PM Warner Mccreedy, MD [facility]    Marylene Land, CNM

## 2021-08-30 LAB — CULTURE, BETA STREP (GROUP B ONLY): Strep Gp B Culture: NEGATIVE

## 2021-09-01 ENCOUNTER — Other Ambulatory Visit: Payer: Medicaid Other

## 2021-09-01 ENCOUNTER — Encounter: Payer: Medicaid Other | Admitting: Obstetrics and Gynecology

## 2021-09-04 IMAGING — DX DG CERVICAL SPINE COMPLETE 4+V
6 series · 6 of 6 positions shown · non-contrast
Comparison: None.

CLINICAL DATA: Neck pain

EXAM:
CERVICAL SPINE - COMPLETE 4+ VIEW

[c-spine lat]
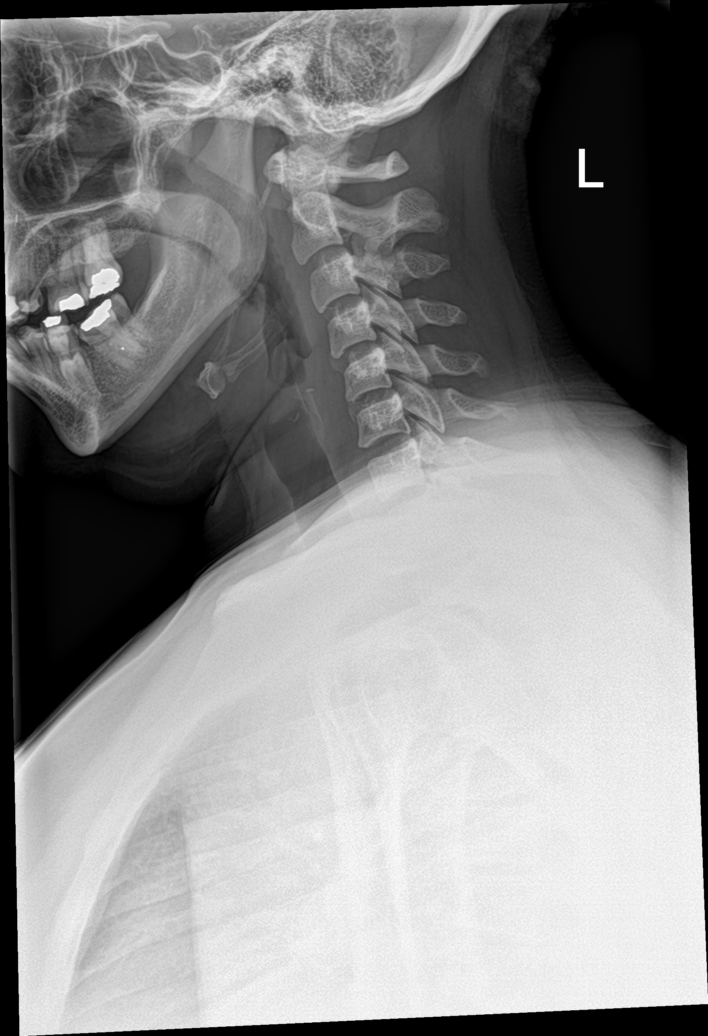

[c-spine obl (1 of 2)]
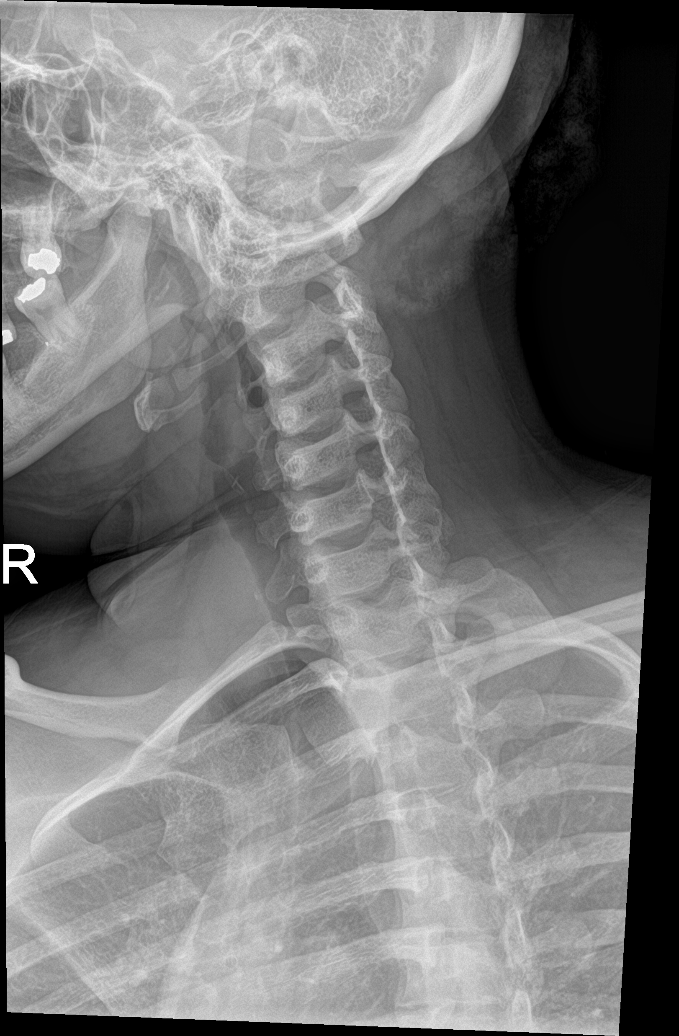

[c-spine obl (2 of 2)]
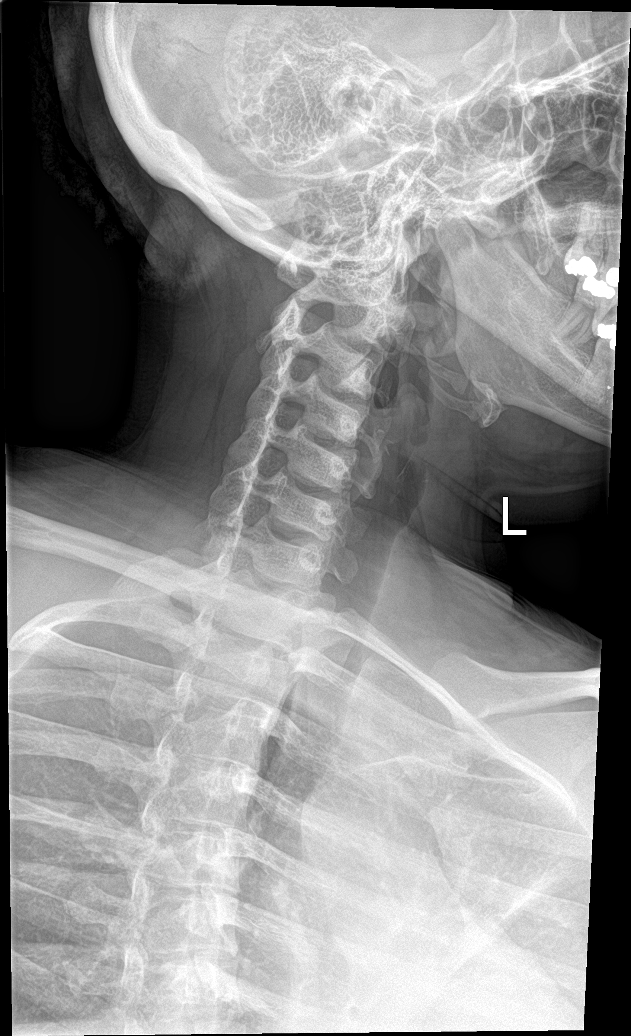

[c-spine ap]
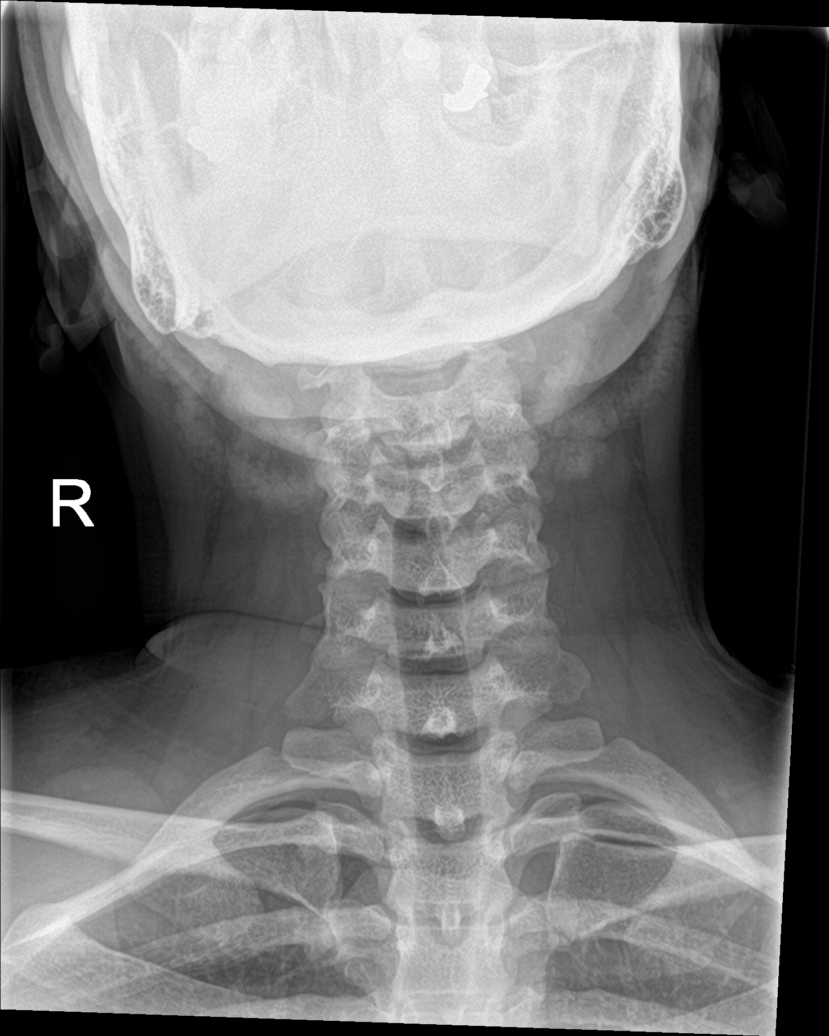

[c-spine open mouth]
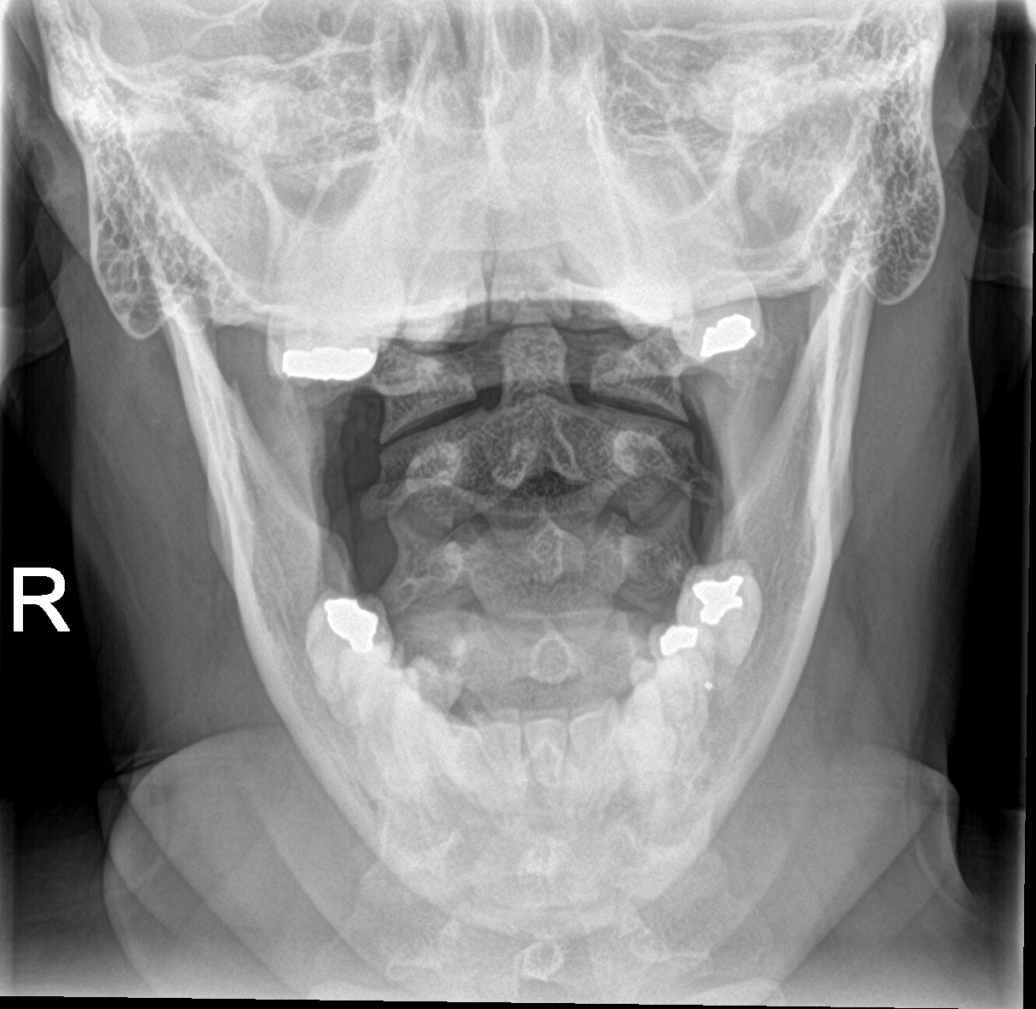

[c-spine swimmers]
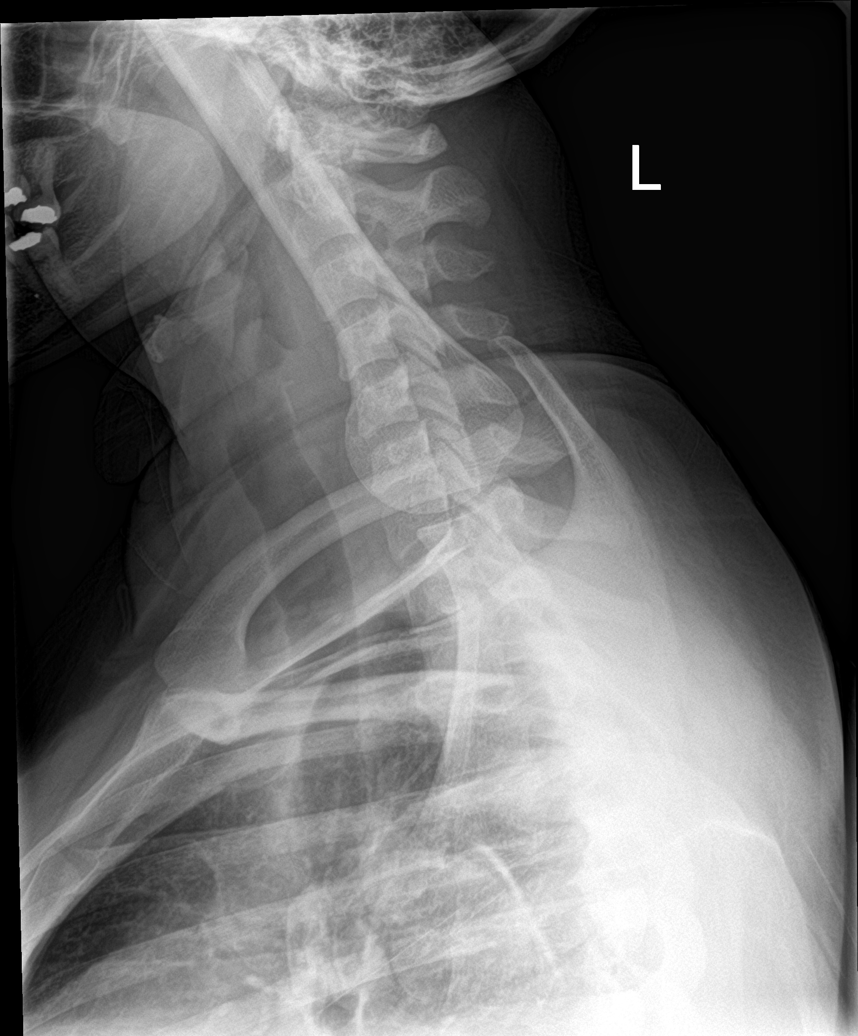

[6 of 6 positions shown; findings below may reference images not displayed]

FINDINGS: Mild straightening of the cervical spine. Normal prevertebral soft
tissue thickness. Vertebral body heights and disc spaces are within
normal limits. Dens and lateral masses are unremarkable.
IMPRESSION: Straightening of the cervical spine.  No acute osseous abnormality

## 2021-09-07 ENCOUNTER — Ambulatory Visit: Payer: Medicaid Other

## 2021-09-07 ENCOUNTER — Ambulatory Visit: Payer: Medicaid Other | Attending: Obstetrics

## 2021-09-08 ENCOUNTER — Ambulatory Visit: Payer: Self-pay | Admitting: Certified Nurse Midwife

## 2021-09-08 DIAGNOSIS — Z3A38 38 weeks gestation of pregnancy: Secondary | ICD-10-CM

## 2021-09-08 DIAGNOSIS — Z3493 Encounter for supervision of normal pregnancy, unspecified, third trimester: Secondary | ICD-10-CM

## 2021-09-10 ENCOUNTER — Encounter (HOSPITAL_COMMUNITY): Payer: Self-pay | Admitting: Obstetrics and Gynecology

## 2021-09-10 ENCOUNTER — Inpatient Hospital Stay (HOSPITAL_COMMUNITY)
Admission: AD | Admit: 2021-09-10 | Discharge: 2021-09-11 | DRG: 776 | Disposition: A | Discharge status: Home / Self Care | Payer: Medicaid Other | Attending: Obstetrics and Gynecology | Admitting: Obstetrics and Gynecology

## 2021-09-10 DIAGNOSIS — O99893 Other specified diseases and conditions complicating puerperium: Secondary | ICD-10-CM | POA: Diagnosis present

## 2021-09-10 DIAGNOSIS — Z87891 Personal history of nicotine dependence: Secondary | ICD-10-CM | POA: Diagnosis not present

## 2021-09-10 DIAGNOSIS — R030 Elevated blood-pressure reading, without diagnosis of hypertension: Secondary | ICD-10-CM | POA: Diagnosis present

## 2021-09-10 DIAGNOSIS — O99324 Drug use complicating childbirth: Secondary | ICD-10-CM | POA: Diagnosis not present

## 2021-09-10 DIAGNOSIS — Z3A38 38 weeks gestation of pregnancy: Secondary | ICD-10-CM | POA: Diagnosis not present

## 2021-09-10 LAB — CBC
HCT: 32.2 % — ABNORMAL LOW (ref 36.0–46.0)
Hemoglobin: 11.6 g/dL — ABNORMAL LOW (ref 12.0–15.0)
MCH: 33.2 pg (ref 26.0–34.0)
MCHC: 36 g/dL (ref 30.0–36.0)
MCV: 92.3 fL (ref 80.0–100.0)
Platelets: 226 10*3/uL (ref 150–400)
RBC: 3.49 MIL/uL — ABNORMAL LOW (ref 3.87–5.11)
RDW: 13.4 % (ref 11.5–15.5)
WBC: 10.1 10*3/uL (ref 4.0–10.5)
nRBC: 0 % (ref 0.0–0.2)

## 2021-09-10 LAB — TYPE AND SCREEN
ABO/RH(D): O POS
Antibody Screen: NEGATIVE

## 2021-09-10 LAB — RPR: RPR Ser Ql: NONREACTIVE

## 2021-09-10 MED ORDER — OXYCODONE-ACETAMINOPHEN 5-325 MG PO TABS: 1.0000 | ORAL_TABLET | ORAL | Status: DC | PRN

## 2021-09-10 MED ORDER — MEASLES, MUMPS & RUBELLA VAC IJ SOLR: 0.5000 mL | Freq: Once | INTRAMUSCULAR | Status: DC

## 2021-09-10 MED ORDER — ONDANSETRON HCL 4 MG/2ML IJ SOLN: 4.0000 mg | Freq: Four times a day (QID) | INTRAMUSCULAR | Status: DC | PRN

## 2021-09-10 MED ORDER — DIBUCAINE (PERIANAL) 1 % EX OINT
1.0000 | TOPICAL_OINTMENT | CUTANEOUS | Status: DC | PRN
  Administered 2021-09-10: 1 via RECTAL
  Filled 2021-09-10: qty 28

## 2021-09-10 MED ORDER — LACTATED RINGERS IV SOLN: 500.0000 mL | INTRAVENOUS | Status: DC | PRN

## 2021-09-10 MED ORDER — LIDOCAINE HCL (PF) 1 % IJ SOLN
30.0000 mL | Freq: Once | INTRAMUSCULAR | Status: AC
  Administered 2021-09-10: 30 mL

## 2021-09-10 MED ORDER — ONDANSETRON HCL 4 MG/2ML IJ SOLN: 4.0000 mg | INTRAMUSCULAR | Status: DC | PRN

## 2021-09-10 MED ORDER — LIDOCAINE HCL (PF) 1 % IJ SOLN: 30.0000 mL | INTRAMUSCULAR | Status: DC | PRN

## 2021-09-10 MED ORDER — COCONUT OIL OIL: 1.0000 | TOPICAL_OIL | Status: DC | PRN

## 2021-09-10 MED ORDER — ACETAMINOPHEN 325 MG PO TABS: 650.0000 mg | ORAL_TABLET | ORAL | Status: DC | PRN

## 2021-09-10 MED ORDER — NIFEDIPINE ER OSMOTIC RELEASE 30 MG PO TB24
30.0000 mg | ORAL_TABLET | Freq: Every day | ORAL | Status: DC
  Administered 2021-09-10 – 2021-09-11 (×2): 30 mg via ORAL
  Filled 2021-09-10 (×2): qty 1

## 2021-09-10 MED ORDER — OXYCODONE-ACETAMINOPHEN 5-325 MG PO TABS: 2.0000 | ORAL_TABLET | ORAL | Status: DC | PRN

## 2021-09-10 MED ORDER — SOD CITRATE-CITRIC ACID 500-334 MG/5ML PO SOLN: 30.0000 mL | ORAL | Status: DC | PRN

## 2021-09-10 MED ORDER — ACETAMINOPHEN 325 MG PO TABS
650.0000 mg | ORAL_TABLET | ORAL | Status: DC | PRN
  Administered 2021-09-10: 650 mg via ORAL
  Filled 2021-09-10: qty 2

## 2021-09-10 MED ORDER — PRENATAL MULTIVITAMIN CH
1.0000 | ORAL_TABLET | Freq: Every day | ORAL | Status: DC
  Administered 2021-09-10 – 2021-09-11 (×2): 1 via ORAL
  Filled 2021-09-10 (×2): qty 1

## 2021-09-10 MED ORDER — FUROSEMIDE 20 MG PO TABS
20.0000 mg | ORAL_TABLET | Freq: Every day | ORAL | Status: DC
  Administered 2021-09-10 – 2021-09-11 (×2): 20 mg via ORAL
  Filled 2021-09-10 (×2): qty 1

## 2021-09-10 MED ORDER — OXYTOCIN 10 UNIT/ML IJ SOLN
10.0000 [IU] | Freq: Once | INTRAMUSCULAR | Status: AC
  Administered 2021-09-10: 10 [IU] via INTRAMUSCULAR

## 2021-09-10 MED ORDER — WITCH HAZEL-GLYCERIN EX PADS
1.0000 | MEDICATED_PAD | CUTANEOUS | Status: DC | PRN
  Administered 2021-09-10: 1 via TOPICAL

## 2021-09-10 MED ORDER — BENZOCAINE-MENTHOL 20-0.5 % EX AERO
1.0000 | INHALATION_SPRAY | CUTANEOUS | Status: DC | PRN
  Administered 2021-09-10: 1 via TOPICAL
  Filled 2021-09-10: qty 56

## 2021-09-10 MED ORDER — OXYTOCIN BOLUS FROM INFUSION: 333.0000 mL | Freq: Once | INTRAVENOUS | Status: DC

## 2021-09-10 MED ORDER — ONDANSETRON HCL 4 MG PO TABS: 4.0000 mg | ORAL_TABLET | ORAL | Status: DC | PRN

## 2021-09-10 MED ORDER — IBUPROFEN 600 MG PO TABS
600.0000 mg | ORAL_TABLET | Freq: Four times a day (QID) | ORAL | Status: DC
  Administered 2021-09-10 – 2021-09-11 (×5): 600 mg via ORAL
  Filled 2021-09-10 (×5): qty 1

## 2021-09-10 MED ORDER — TETANUS-DIPHTH-ACELL PERTUSSIS 5-2.5-18.5 LF-MCG/0.5 IM SUSY: 0.5000 mL | PREFILLED_SYRINGE | Freq: Once | INTRAMUSCULAR | Status: DC

## 2021-09-10 MED ORDER — SENNOSIDES-DOCUSATE SODIUM 8.6-50 MG PO TABS
2.0000 | ORAL_TABLET | ORAL | Status: DC
  Administered 2021-09-10 – 2021-09-11 (×2): 2 via ORAL
  Filled 2021-09-10 (×2): qty 2

## 2021-09-10 MED ORDER — FENTANYL CITRATE (PF) 100 MCG/2ML IJ SOLN
100.0000 ug | Freq: Once | INTRAMUSCULAR | Status: AC
  Administered 2021-09-10: 100 ug via INTRAMUSCULAR

## 2021-09-10 MED ORDER — OXYTOCIN-SODIUM CHLORIDE 30-0.9 UT/500ML-% IV SOLN: 2.5000 [IU]/h | INTRAVENOUS | Status: DC

## 2021-09-10 MED ORDER — DIPHENHYDRAMINE HCL 25 MG PO CAPS: 25.0000 mg | ORAL_CAPSULE | Freq: Four times a day (QID) | ORAL | Status: DC | PRN

## 2021-09-10 MED ORDER — LACTATED RINGERS IV SOLN: INTRAVENOUS | Status: DC

## 2021-09-10 MED ORDER — SIMETHICONE 80 MG PO CHEW: 80.0000 mg | CHEWABLE_TABLET | ORAL | Status: DC | PRN

## 2021-09-10 NOTE — H&P (Signed)
OBSTETRIC ADMISSION HISTORY AND PHYSICAL  Kristie Sandoval is a 36 y.o. female G3P1011 with IUP at [redacted]w[redacted]d presenting after delivery at home. She plans on bottlefeeding. She requests none for birth control.  Dating: By Mackie Pai [place] --->  Estimated Date of Delivery: 09/20/21  Sono:    @[redacted]w[redacted]d , normal anatomy, cephalic presentation, 230g, 51%ile   Prenatal History/Complications: -late PNC at 34 wks, 1 visit -MJ use  Past Medical History: Past Medical History:  Diagnosis Date   Chlamydia infection, current pregnancy 12/12   Test of cure negative in April   GERD (gastroesophageal reflux disease)    Gonorrhea, current pregnancy 12/12   Test of cure negative in April   Headache(784.0)    no hx prior to preg   Trichomonas     Past Surgical History: Past Surgical History:  Procedure Laterality Date   NO PAST SURGERIES      Obstetrical History: OB History     Gravida  3   Para  1   Term  1   Preterm  0   AB  1   Living  1      SAB  1   IAB  0   Ectopic  0   Multiple  0   Live Births  1           Social History: Social History   Socioeconomic History   Marital status: Single    Spouse name: Not on file   Number of children: Not on file   Years of education: Not on file   Highest education level: Not on file  Occupational History   Not on file  Tobacco Use   Smoking status: Former    Packs/day: 1.00    Years: 10.00    Total pack years: 10.00    Types: Cigarettes    Quit date: 10/04/2017    Years since quitting: 3.9   Smokeless tobacco: Former    Quit date: 02/01/2012  Vaping Use   Vaping Use: Never used  Substance and Sexual Activity   Alcohol use: No    Comment: rarely   Drug use: Not Currently    Types: Marijuana    Comment: no longer    Sexual activity: Not Currently    Birth control/protection: None  Other Topics Concern   Not on file  Social History Narrative   Not on file   Social Determinants of Health   Financial Resource  Strain: Not on file  Food Insecurity: Not on file  Transportation Needs: Not on file  Physical Activity: Not on file  Stress: Not on file  Social Connections: Not on file    Family History: Family History  Problem Relation Age of Onset   Diabetes Mother    Diabetes Maternal Grandmother    Anesthesia problems Neg Hx    Hypotension Neg Hx    Malignant hyperthermia Neg Hx    Pseudochol deficiency Neg Hx    Other Neg Hx     Allergies: No Known Allergies  No medications prior to admission.     Review of Systems:  All systems reviewed and negative except as stated in HPI  PE: Blood pressure (!) 144/90, pulse 75. General appearance: alert, cooperative, and moderate distress Lungs: regular rate and effort Heart: regular rate  Abdomen: soft, non-tender Extremities: Homans sign is negative, no sign of DVT   Prenatal labs: ABO, Rh: O/Positive/-- (05/25 1635) Antibody: Negative (05/25 1635) Rubella: 4.72 (05/25 1635) RPR: Non Reactive (05/25 1635)  HBsAg: Negative (05/25 1635)  HIV: Non Reactive (05/25 1635)  GBS: Negative/-- (06/08 1708)  2 hr GTT not done  Prenatal Transfer Tool  Maternal Diabetes: No Genetic Screening: Normal Maternal Ultrasounds/Referrals: Normal Fetal Ultrasounds or other Referrals:  None Maternal Substance Abuse:  Yes:  Type: Marijuana Significant Maternal Medications:  None Significant Maternal Lab Results: Group B Strep negative  No results found for this or any previous visit (from the past 24 hour(s)).  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

## 2021-09-11 MED ORDER — NIFEDIPINE ER 30 MG PO TB24: 30.0000 mg | ORAL_TABLET | Freq: Every day | ORAL | 1 refills | Status: AC

## 2021-09-11 MED ORDER — IBUPROFEN 600 MG PO TABS: 600.0000 mg | ORAL_TABLET | Freq: Four times a day (QID) | ORAL | 0 refills | Status: AC

## 2021-09-11 MED ORDER — FUROSEMIDE 20 MG PO TABS: 20.0000 mg | ORAL_TABLET | Freq: Every day | ORAL | 0 refills | Status: AC

## 2021-09-11 NOTE — Clinical Social Work Maternal (Signed)
CLINICAL SOCIAL WORK MATERNAL/CHILD NOTE  Patient Details  Name: Kristie Sandoval MRN: [id number] Date of Birth: [birth year]  Date:  09/11/2021  Clinical Social Worker Initiating Note:  Albertine Patricia Date/Time: Initiated:  09/11/21/1330     Child's Name:  "Kristie Sandoval"   Biological Parents:  Mother   Need for Interpreter:  None   Reason for Referral:  Late or No Prenatal Care  , Current Substance Use/Substance Use During Pregnancy     Address: [address] [place], [place] [address]   Phone number:  [phone] (home)     Additional phone number:   Household Members/Support Persons (HM/SP):   Household Member/Support Person 1   HM/SP Name Relationship DOB or Age  HM/SP -1 Kerby Nora Daughter 10/26/2011  HM/SP -2        HM/SP -3        HM/SP -4        HM/SP -5        HM/SP -6        HM/SP -7        HM/SP -8          Natural Supports (not living in the home):  Friends, Investment banker, corporate Supports: None   Employment: Unemployed   Type of Work:     Education:  Some Materials engineer arranged:    Surveyor, quantity Resources:  Medicaid   Other Resources:  Sales executive  , WIC   Cultural/Religious Considerations Which May Impact Care:  None identified  Strengths:  Ability to meet basic needs  , Pediatrician chosen   Psychotropic Medications:         Pediatrician:    Armed forces operational officer area  Pediatrician List:   [facility] ([address])  [place]    [place]      Pediatrician Fax Number:    Risk Factors/Current Problems:  Substance Use  , Basic Needs     Cognitive State:  Able to Concentrate  , Alert  , Goal Oriented  , Linear Thinking  , Poor Judgement   , Labile  Mood/Affect:  Tearful  , Bright  , Animated , Interested  , Overwhelmed  , Happy     CSW Assessment: CSW met with MOB to address consult for history of substance use and limited prenatal  care and complete assessement. When CSW entered room, MOB was initially in the bathroom and MOB's friend who identified herself as infant's "Godmomma" was sitting in chair. Infant "Kristie Sandoval" was observed sleeping on her back in [place]. MOB verbalized consent to complete assessment with all individuals in the room. MOB presented as engaged and polite towards CSW but was confused about reason for consult. CSW answered MOB's questions about the reason for CSW completing an assessment and provided clarification regarding MOB's understanding of the hospital drug screen policy. MOB reported she has been feeling emotionally "happy" since giving birth and was observed smiling. MOB presented with a cheerful but labile affect. MOB was observed as tearful during discussion of MOB's recent stressors that impacted her ability to attend routine prenatal care, during discussion of SIDS and the hospital drug screen policy. MOB returned to a cheerful disposition when the topic of conversation changed.  MOB reports she used marijuana during her pregnancy and states her last use was one month ago. MOB also reports using cocaine in powder form at the beginning of  her pregnancy. MOB reports her last use of cocaine was "months ago."   Per chart review, MOB attended 2 prenatal care appointments. When asked about barriers to attending prenatal care, MOB reported she has had "a lot going on", marked by her 34 year old godson being diagnosed with cancer and her 34 year old daughter was hit by a stray bullet at her previous residence, resulting in her daughter being in a wheelchair.   MOB denies a history of mental health issues and denies current symptoms of depression/anxiety. MOB denies current SI/HI. DV was not assessed due to MOB's friend being in the room. MOB reports she has a "great support system", including her infant's godmother and MOB's mother. When asked about coping skills, MOB reports she prays and "trusts in God."    MOB reported financial stress. When asked if MOB has all needed baby items, MOB reports she does not have a crib or basinet because infant was born earlier than anticipated. MOB had a car seat/stroller in the room but reported she borrowed money to purchase the car seat/stroller and requested a car seat from the hospital to avoid financial strain. MOB reported she did not have the $30 to pay for the hospital car seat. CSW provided MOB with pack and play and car seat free of charge. MOB reports she receives food stamps and WIC. MOB denied additional resource needs and denied future barriers to get to pediatrician.  CSW provided education regarding the baby blues period vs. perinatal mood disorders, discussed treatment and gave resources for mental health follow up if concerns arise.  CSW recommends self-evaluation during the postpartum time period using the New Mom Checklist from Postpartum Progress and encouraged MOB to contact a medical professional if symptoms are noted at any time.   CSW provided review of Sudden Infant Death Syndrome (SIDS) precautions.    CSW explained hospital drug screen policy. RN entered room and reported that per pediatrician, infant no longer meets criteria for drug screen. Due to MOB reporting substance use during pregnancy, CSW explained that infant does meet criteria for hospital drug screen policy and agreed to follow up with pediatrician. Per pediatrician, urine drug screen will no longer be collected; however, umbilical cord tissue drug screen will be collected and followed due to MOB reported substance use during pregnancy. CSW explained to MOB that CDS will be monitored and a CPS report will be made if warranted. MOB verbalized understanding.   CSW Plan/Description:  Hospital Drug Screen Policy Information, CSW Will Continue to Monitor Umbilical Cord Tissue Drug Screen Results and Make Report if Warranted, Sudden Infant Death Syndrome (SIDS) Education, Perinatal Mood  and Anxiety Disorder (PMADs) Education, No Further Intervention Required/No Barriers to Discharge    Norberto Sorenson, LCSWA 09/11/2021, 1:44 PM

## 2021-09-17 ENCOUNTER — Encounter: Payer: Self-pay | Admitting: Obstetrics & Gynecology

## 2021-09-18 ENCOUNTER — Telehealth (HOSPITAL_COMMUNITY): Payer: Self-pay | Admitting: *Deleted

## 2021-09-18 NOTE — Telephone Encounter (Signed)
Attempted Hospital Discharge Follow-up Call.  No answer.  Unable to leave a message. 

## 2021-09-20 ENCOUNTER — Inpatient Hospital Stay (HOSPITAL_COMMUNITY): Admit: 2021-09-20 | Payer: Self-pay

## 2021-09-23 ENCOUNTER — Encounter: Payer: Self-pay | Admitting: Family Medicine

## 2022-01-04 ENCOUNTER — Telehealth: Payer: No Typology Code available for payment source | Admitting: Nurse Practitioner

## 2022-01-04 ENCOUNTER — Telehealth: Payer: No Typology Code available for payment source

## 2022-01-04 DIAGNOSIS — K219 Gastro-esophageal reflux disease without esophagitis: Secondary | ICD-10-CM | POA: Diagnosis not present

## 2022-01-04 DIAGNOSIS — R110 Nausea: Secondary | ICD-10-CM

## 2022-01-04 MED ORDER — ONDANSETRON HCL 4 MG PO TABS: 4.0000 mg | ORAL_TABLET | Freq: Three times a day (TID) | ORAL | 0 refills | Status: DC | PRN

## 2022-01-04 MED ORDER — PANTOPRAZOLE SODIUM 20 MG PO TBEC: 20.0000 mg | DELAYED_RELEASE_TABLET | Freq: Every day | ORAL | 0 refills | Status: AC

## 2022-01-04 NOTE — Patient Instructions (Signed)

## 2022-01-04 NOTE — Progress Notes (Signed)
Virtual Visit Consent   Kristie Sandoval, you are scheduled for a virtual visit with Mary-Margaret Daphine Deutscher, FNP, a [facility] provider, today.     Just as with appointments in the office, your consent must be obtained to participate.  Your consent will be active for this visit and any virtual visit you may have with one of our providers in the next 365 days.     If you have a MyChart account, a copy of this consent can be sent to you electronically.  All virtual visits are billed to your insurance company just like a traditional visit in the office.    As this is a virtual visit, video technology does not allow for your provider to perform a traditional examination.  This may limit your provider's ability to fully assess your condition.  If your provider identifies any concerns that need to be evaluated in person or the need to arrange testing (such as labs, EKG, etc.), we will make arrangements to do so.     Although advances in technology are sophisticated, we cannot ensure that it will always work on either your end or our end.  If the connection with a video visit is poor, the visit may have to be switched to a telephone visit.  With either a video or telephone visit, we are not always able to ensure that we have a secure connection.     I need to obtain your verbal consent now.   Are you willing to proceed with your visit today? YES   Kristie Sandoval has provided verbal consent on 01/04/2022 for a virtual visit (video or telephone).   Mary-Margaret Daphine Deutscher, FNP   Date: 01/04/2022 5:53 PM   Virtual Visit via Video Note   I, Mary-Margaret Jahking Lesser, connected with Kristie Sandoval ([id number], 34-02-1986) on 01/04/22 at  6:00 PM EDT by a video-enabled telemedicine application and verified that I am speaking with the correct person using two identifiers.  Location: Patient: Virtual Visit Location Patient: Home Provider: Virtual Visit Location Provider: Mobile   I discussed the  limitations of evaluation and management by telemedicine and the availability of in person appointments. The patient expressed understanding and agreed to proceed.    History of Present Illness: Kristie Sandoval is a 36 y.o. who identifies as a female who was assigned female at birth, and is being seen today for feels bad.  HPI: Patient had a baby back in June. Started having nausea and vomiting off and on since the birth of her child. Lately it has been worse. She says she has not been sexually active since she hd the baby. Related to eating greasy foods. Her acid reflux has worsened. She is on pepcid and that is  not helping.  *patient is not breast feeding    ROS  Problems:  Patient Active Problem List   Diagnosis Date Noted   Vaginal delivery 09/10/2021   Supervision of high risk pregnancy, antepartum 08/12/2021   Laryngopharyngeal reflux (LPR) 01/08/2018   Lymphadenopathy 12/14/2017    Allergies: No Known Allergies Medications:  Current Outpatient Medications:    furosemide (LASIX) 20 MG tablet, Take 1 tablet (20 mg total) by mouth daily., Disp: 5 tablet, Rfl: 0   ibuprofen (ADVIL) 600 MG tablet, Take 1 tablet (600 mg total) by mouth every 6 (six) hours., Disp: 30 tablet, Rfl: 0   NIFEdipine (ADALAT CC) 30 MG 24 hr tablet, Take 1 tablet (30 mg total) by mouth daily., Disp: 30 tablet,  Rfl: 1  Observations/Objective: Patient is well-developed, well-nourished in no acute distress.  Resting comfortably  at home.  Head is normocephalic, atraumatic.  No labored breathing.  Speech is clear and coherent with logical content.  Patient is alert and oriented at baseline.    Assessment and Plan:  Sha L Spikes in today with chief complaint of nausea  1. Nausea - ondansetron (ZOFRAN) 4 MG tablet; Take 1 tablet (4 mg total) by mouth every 8 (eight) hours as needed for nausea or vomiting.  Dispense: 20 tablet; Refill: 0  2. Gastroesophageal reflux disease without  esophagitis Avoid spicy and fatty foods If not improving will need further testing. - pantoprazole (PROTONIX) 20 MG tablet; Take 1 tablet (20 mg total) by mouth daily.  Dispense: 90 tablet; Refill: 0   Follow Up Instructions: I discussed the assessment and treatment plan with the patient. The patient was provided an opportunity to ask questions and all were answered. The patient agreed with the plan and demonstrated an understanding of the instructions.  A copy of instructions were sent to the patient via MyChart.  The patient was advised to call back or seek an in-person evaluation if the symptoms worsen or if the condition fails to improve as anticipated.  Time:  I spent 13 minutes with the patient via telehealth technology discussing the above problems/concerns.    Mary-Margaret Hassell Done, FNP

## 2022-02-23 ENCOUNTER — Telehealth: Payer: No Typology Code available for payment source | Admitting: Physician Assistant

## 2022-02-23 DIAGNOSIS — K089 Disorder of teeth and supporting structures, unspecified: Secondary | ICD-10-CM | POA: Diagnosis not present

## 2022-02-23 DIAGNOSIS — R110 Nausea: Secondary | ICD-10-CM | POA: Diagnosis not present

## 2022-02-23 DIAGNOSIS — N760 Acute vaginitis: Secondary | ICD-10-CM

## 2022-02-23 DIAGNOSIS — G8929 Other chronic pain: Secondary | ICD-10-CM

## 2022-02-23 DIAGNOSIS — B9689 Other specified bacterial agents as the cause of diseases classified elsewhere: Secondary | ICD-10-CM

## 2022-02-23 MED ORDER — KETOROLAC TROMETHAMINE 10 MG PO TABS: 10.0000 mg | ORAL_TABLET | Freq: Four times a day (QID) | ORAL | 0 refills | Status: AC | PRN

## 2022-02-23 MED ORDER — METRONIDAZOLE 500 MG PO TABS: 500.0000 mg | ORAL_TABLET | Freq: Two times a day (BID) | ORAL | 0 refills | Status: DC

## 2022-02-23 MED ORDER — ONDANSETRON HCL 4 MG PO TABS: 4.0000 mg | ORAL_TABLET | Freq: Three times a day (TID) | ORAL | 0 refills | Status: AC | PRN

## 2022-02-23 NOTE — Patient Instructions (Signed)
Bettymae L Jumper, thank you for joining Piedad Climes, PA-C for today's virtual visit.  While this provider is not your primary care provider (PCP), if your PCP is located in our provider database this encounter information will be shared with them immediately following your visit.   A [facility] MyChart account gives you access to today's visit and all your visits, tests, and labs performed at [facility] " click here if you don't have a [facility] MyChart account or go to [email or web address]  Consent: (Patient) Elleana L Wassink provided verbal consent for this virtual visit at the beginning of the encounter.  Current Medications:  Current Outpatient Medications:    ketorolac (TORADOL) 10 MG tablet, Take 1 tablet (10 mg total) by mouth every 6 (six) hours as needed., Disp: 20 tablet, Rfl: 0   metroNIDAZOLE (FLAGYL) 500 MG tablet, Take 1 tablet (500 mg total) by mouth 2 (two) times daily., Disp: 14 tablet, Rfl: 0   furosemide (LASIX) 20 MG tablet, Take 1 tablet (20 mg total) by mouth daily., Disp: 5 tablet, Rfl: 0   ibuprofen (ADVIL) 600 MG tablet, Take 1 tablet (600 mg total) by mouth every 6 (six) hours., Disp: 30 tablet, Rfl: 0   NIFEdipine (ADALAT CC) 30 MG 24 hr tablet, Take 1 tablet (30 mg total) by mouth daily., Disp: 30 tablet, Rfl: 1   ondansetron (ZOFRAN) 4 MG tablet, Take 1 tablet (4 mg total) by mouth every 8 (eight) hours as needed for nausea or vomiting., Disp: 30 tablet, Rfl: 0   pantoprazole (PROTONIX) 20 MG tablet, Take 1 tablet (20 mg total) by mouth daily., Disp: 90 tablet, Rfl: 0   Medications ordered in this encounter:  Meds ordered this encounter  Medications   metroNIDAZOLE (FLAGYL) 500 MG tablet    Sig: Take 1 tablet (500 mg total) by mouth 2 (two) times daily.    Dispense:  14 tablet    Refill:  0    Order Specific Question:   Supervising Provider    Answer:   LAMPTEY, PHILIP O [[id number]]   ondansetron (ZOFRAN) 4 MG tablet    Sig: Take 1  tablet (4 mg total) by mouth every 8 (eight) hours as needed for nausea or vomiting.    Dispense:  30 tablet    Refill:  0    Order Specific Question:   Supervising Provider    Answer:   Merrilee Jansky [id number]   ketorolac (TORADOL) 10 MG tablet    Sig: Take 1 tablet (10 mg total) by mouth every 6 (six) hours as needed.    Dispense:  20 tablet    Refill:  0    Order Specific Question:   Supervising Provider    Answer:   Merrilee Jansky [id number]     *If you need refills on other medications prior to your next appointment, please contact your pharmacy*  Follow-Up: Call back or seek an in-person evaluation if the symptoms worsen or if the condition fails to improve as anticipated.  [facility] [phone]  Other Instructions  If you have been instructed to have an in-person evaluation today at a local Urgent Care facility, please use the link below. It will take you to a list of all of our available [facility], including address, phone number and hours of operation. Please do not delay care.  [facility]  If you or a family member do not have a primary care provider, use the  link below to schedule a visit and establish care. When you choose a [facility] primary care physician or advanced practice provider, you gain a long-term partner in health. Find a Primary Care Provider  Learn more about [facility]'s in-office and virtual care options: [facility]

## 2022-02-23 NOTE — Progress Notes (Signed)
Virtual Visit Consent   Kristie Sandoval, you are scheduled for a virtual visit with a [facility] provider today. Just as with appointments in the office, your consent must be obtained to participate. Your consent will be active for this visit and any virtual visit you may have with one of our providers in the next 365 days. If you have a MyChart account, a copy of this consent can be sent to you electronically.  As this is a virtual visit, video technology does not allow for your provider to perform a traditional examination. This may limit your provider's ability to fully assess your condition. If your provider identifies any concerns that need to be evaluated in person or the need to arrange testing (such as labs, EKG, etc.), we will make arrangements to do so. Although advances in technology are sophisticated, we cannot ensure that it will always work on either your end or our end. If the connection with a video visit is poor, the visit may have to be switched to a telephone visit. With either a video or telephone visit, we are not always able to ensure that we have a secure connection.  By engaging in this virtual visit, you consent to the provision of healthcare and authorize for your insurance to be billed (if applicable) for the services provided during this visit. Depending on your insurance coverage, you may receive a charge related to this service.  I need to obtain your verbal consent now. Are you willing to proceed with your visit today? Kristie Sandoval has provided verbal consent on 02/23/2022 for a virtual visit (video or telephone). [name], [place]  Date: 02/23/2022 7:50 PM  Virtual Visit via Video Note   I, [name], connected with  Kristie Sandoval  ([id number], February 15, 1986) on 02/23/22 at  7:30 PM EST by a video-enabled telemedicine application and verified that I am speaking with the correct person using two identifiers.  Location: Patient: Virtual Visit  Location Patient: Home Provider: Virtual Visit Location Provider: Home Office   I discussed the limitations of evaluation and management by telemedicine and the availability of in person appointments. The patient expressed understanding and agreed to proceed.    History of Present Illness: Kristie Sandoval is a 36 y.o. who identifies as a female who was assigned female at birth, and is being seen today for vaginal itching and foul-smelling discharge after using some victoria Secret soap in the area because she did not have her regular soap. Has history of BV after new soaps used and this seems identical to prior episodes.   Patient also with a back upper molar whose filling came out this week and she is noting some substantial pain. Denies fever, chills, swelling around the area. Is trying to get in with her dental provider. Pain is causing some nausea. Requesting refill of Zofran given before.    HPI: HPI  Problems:  Patient Active Problem List   Diagnosis Date Noted   Vaginal delivery 09/10/2021   Supervision of high risk pregnancy, antepartum 08/12/2021   Laryngopharyngeal reflux (LPR) 01/08/2018   Lymphadenopathy 12/14/2017    Allergies: No Known Allergies Medications:  Current Outpatient Medications:    ketorolac (TORADOL) 10 MG tablet, Take 1 tablet (10 mg total) by mouth every 6 (six) hours as needed., Disp: 20 tablet, Rfl: 0   metroNIDAZOLE (FLAGYL) 500 MG tablet, Take 1 tablet (500 mg total) by mouth 2 (two) times daily., Disp: 14 tablet, Rfl: 0  furosemide (LASIX) 20 MG tablet, Take 1 tablet (20 mg total) by mouth daily., Disp: 5 tablet, Rfl: 0   ibuprofen (ADVIL) 600 MG tablet, Take 1 tablet (600 mg total) by mouth every 6 (six) hours., Disp: 30 tablet, Rfl: 0   NIFEdipine (ADALAT CC) 30 MG 24 hr tablet, Take 1 tablet (30 mg total) by mouth daily., Disp: 30 tablet, Rfl: 1   ondansetron (ZOFRAN) 4 MG tablet, Take 1 tablet (4 mg total) by mouth every 8 (eight) hours as needed  for nausea or vomiting., Disp: 30 tablet, Rfl: 0   pantoprazole (PROTONIX) 20 MG tablet, Take 1 tablet (20 mg total) by mouth daily., Disp: 90 tablet, Rfl: 0  Observations/Objective: Patient is well-developed, well-nourished in no acute distress.  Resting comfortably at home.  Head is normocephalic, atraumatic.  No labored breathing. Speech is clear and coherent with logical content.  Patient is alert and oriented at baseline.   Assessment and Plan: 1. Bacterial vaginosis - metroNIDAZOLE (FLAGYL) 500 MG tablet; Take 1 tablet (500 mg total) by mouth 2 (two) times daily.  Dispense: 14 tablet; Refill: 0  Classic symptoms. No alarm signs/symptoms. She is not breastfeeding and no concerns for pregnancy. Rx Metronidazole to take as directed.  2. Nausea - ondansetron (ZOFRAN) 4 MG tablet; Take 1 tablet (4 mg total) by mouth every 8 (eight) hours as needed for nausea or vomiting.  Dispense: 30 tablet; Refill: 0  3. Chronic dental pain - ketorolac (TORADOL) 10 MG tablet; Take 1 tablet (10 mg total) by mouth every 6 (six) hours as needed.  Dispense: 20 tablet; Refill: 0  Will give short course of Toradol to help with pain until she can get in with her dental provider in next few days. Any new or worsening symptoms, she is to seek an in-person evaluation.   Follow Up Instructions: I discussed the assessment and treatment plan with the patient. The patient was provided an opportunity to ask questions and all were answered. The patient agreed with the plan and demonstrated an understanding of the instructions.  A copy of instructions were sent to the patient via MyChart unless otherwise noted below.   The patient was advised to call back or seek an in-person evaluation if the symptoms worsen or if the condition fails to improve as anticipated.  Time:  I spent 10 minutes with the patient via telehealth technology discussing the above problems/concerns.    Piedad Climes, PA-C

## 2022-02-25 ENCOUNTER — Telehealth: Payer: Self-pay | Admitting: Physician Assistant

## 2022-02-25 NOTE — Progress Notes (Signed)
The patient no-showed for appointment despite this provider sending direct link x 2 with no response and waiting for at least 10 minutes from appointment time for patient to join. They will be marked as a NS for this appointment/time.   [name] M Jailan Trimm, PA-C    

## 2024-03-22 ENCOUNTER — Telehealth: Admitting: Physician Assistant

## 2024-03-22 DIAGNOSIS — B9689 Other specified bacterial agents as the cause of diseases classified elsewhere: Secondary | ICD-10-CM | POA: Diagnosis not present

## 2024-03-22 DIAGNOSIS — N760 Acute vaginitis: Secondary | ICD-10-CM | POA: Diagnosis not present

## 2024-03-22 MED ORDER — METRONIDAZOLE 500 MG PO TABS: 500.0000 mg | ORAL_TABLET | Freq: Two times a day (BID) | ORAL | 0 refills | Status: AC

## 2024-03-22 NOTE — Progress Notes (Signed)

## 2024-04-22 ENCOUNTER — Telehealth: Admitting: Nurse Practitioner

## 2024-04-22 DIAGNOSIS — B001 Herpesviral vesicular dermatitis: Secondary | ICD-10-CM

## 2024-04-22 MED ORDER — ACYCLOVIR 5 % EX OINT: 1.0000 | TOPICAL_OINTMENT | Freq: Every day | CUTANEOUS | 0 refills | Status: AC | PRN

## 2024-04-22 NOTE — Progress Notes (Signed)
 We are sorry that you are not feeling well.  Here is how we plan to help!  Based on your symptoms, it appears you may have a viral infection.   Cold sores, also known as fever blisters, are small, fluid-filled blisters that typically appear around the mouth. They are caused by the herpes simplex virus, most commonly herpes simplex virus type 1 (HSV-1). The virus spreads through direct skin contact, as well as by sharing items like eating utensils, lip balms, or towels.   Cold sores are contagious until they dry out, which usually takes about 5 to 7 days. Its important to wash your hands frequently, especially after touching the affected area. If you wear contact lenses, avoid handling them after touching a cold sore, as the virus can spread to your eyes and cause complications.   Most people experience pain at the site or tingling sensations in their lips that may begin before the ulcers erupt.  Herpes simplex is treatable but not curable.  It may lie dormant for a long time and then reappear due to stress or prolonged sun exposure.  Many patients have success in treating their cold sores with an over the counter topical called Abreva.  You may apply the cream up to 5 times daily (maximum 10 days) until healing occurs.  To help speed the healing of your cold sore, I have prescribed Acyclovir  cream (Zovirax ) 5% to be applied topically to affected area up to 5 times daily for up to 7 days, per your request.   HOME CARE:  Wash your hands frequently. Do not pick at or rub the sore. Don't pop the blisters. Avoid kissing other people during this time. Avoid sharing drinking glasses, eating utensils, or razors. Do not handle contact lenses unless you have thoroughly washed your hands with soap and warm water! Avoid oral sex during this time.  Herpes from sores on your mouth can spread to your partner's genital area. Avoid contact with anyone who has eczema or a weakened immune system. Cold sores  are often triggered by exposure to intense sunlight, use a lip balm containing a sunscreen (SPF 30 or higher).  GET HELP RIGHT AWAY IF:  Blisters look infected -- increased redness around the site, warmth of skin, drainage of pus from around the area. Blisters occur near or in the eye. Symptoms last longer than 10 days. Your symptoms worsen.  MAKE SURE YOU:  Understand these instructions. Will watch your condition. Will get help right away if you are not doing well or get worse.  Your e-visit answers were reviewed by a board certified advanced clinical practitioner to complete your personal care plan.  Depending upon the condition, your plan could have included both over the counter or prescription medications.     Please review your pharmacy choice.  Be sure that the pharmacy you have chosen is open so that you can pick up your prescription now.  If there is a problem, you can message your provider in MyChart to have the prescription routed to another pharmacy.     Your safety is important to us .  If you have drug allergies, check our prescription carefully.   For the next 24 hours you can use MyChart to ask questions about today's visit, request a non-urgent call back, or ask for a work or school excuse from your e-visit provider.   You will receive an email in the next two days asking about your experience.  I hope that your e-visit has been  valuable and will speed up your recovery.    I have spent 5 minutes in review of e-visit questionnaire, review and updating patient chart, medical decision making and response to patient.   Hadassah Fireman, NP
